# Patient Record
Sex: Male | Born: 1990 | Race: White | Hispanic: No | Marital: Single | Smoking: Former smoker
Health system: Southern US, Community
[De-identification: ages and names within clinical notes are randomized; demographics above are authoritative.]

## PROBLEM LIST (undated history)

## (undated) DIAGNOSIS — K219 Gastro-esophageal reflux disease without esophagitis: Secondary | ICD-10-CM

## (undated) DIAGNOSIS — T7840XA Allergy, unspecified, initial encounter: Secondary | ICD-10-CM

## (undated) DIAGNOSIS — I82409 Acute embolism and thrombosis of unspecified deep veins of unspecified lower extremity: Secondary | ICD-10-CM

## (undated) HISTORY — DX: Allergy, unspecified, initial encounter: T78.40XA

## (undated) HISTORY — PX: LEG SURGERY: SHX1003

## (undated) HISTORY — PX: KNEE SURGERY: SHX244

## (undated) HISTORY — PX: WISDOM TOOTH EXTRACTION: SHX21

---
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
## 2014-08-04 DIAGNOSIS — Z9109 Other allergy status, other than to drugs and biological substances: Secondary | ICD-10-CM | POA: Insufficient documentation

## 2015-08-03 DIAGNOSIS — J302 Other seasonal allergic rhinitis: Secondary | ICD-10-CM | POA: Insufficient documentation

## 2017-06-17 DIAGNOSIS — F1090 Alcohol use, unspecified, uncomplicated: Secondary | ICD-10-CM | POA: Insufficient documentation

## 2017-06-17 DIAGNOSIS — R1013 Epigastric pain: Secondary | ICD-10-CM | POA: Insufficient documentation

## 2019-10-12 ENCOUNTER — Other Ambulatory Visit: Payer: Self-pay

## 2019-10-12 DIAGNOSIS — Z20822 Contact with and (suspected) exposure to covid-19: Secondary | ICD-10-CM

## 2019-10-14 LAB — NOVEL CORONAVIRUS, NAA: SARS-CoV-2, NAA: NOT DETECTED

## 2019-10-16 ENCOUNTER — Other Ambulatory Visit: Payer: Self-pay

## 2019-10-17 ENCOUNTER — Telehealth: Payer: Self-pay

## 2019-10-17 NOTE — Telephone Encounter (Signed)
Per pt request, my chart message sent to pt to notify his manager regarding pending covid results  after for 5 days waiting for results to come back.Marland Kitchen

## 2019-10-19 ENCOUNTER — Telehealth: Payer: Self-pay

## 2019-10-19 NOTE — Telephone Encounter (Signed)
Received call from patient checking Covid results.  Advised no results at this time.   

## 2019-12-10 ENCOUNTER — Other Ambulatory Visit: Payer: Self-pay

## 2020-02-25 DIAGNOSIS — I82409 Acute embolism and thrombosis of unspecified deep veins of unspecified lower extremity: Principal | ICD-10-CM

## 2020-02-25 HISTORY — DX: Acute embolism and thrombosis of unspecified deep veins of unspecified lower extremity: I82.409

## 2020-03-16 ENCOUNTER — Emergency Department (HOSPITAL_COMMUNITY): Payer: 59

## 2020-03-16 ENCOUNTER — Inpatient Hospital Stay (HOSPITAL_COMMUNITY)
Admission: EM | Admit: 2020-03-16 | Discharge: 2020-03-19 | DRG: 872 | Disposition: A | Discharge status: Home / Self Care | Payer: 59 | Attending: Internal Medicine | Admitting: Internal Medicine

## 2020-03-16 ENCOUNTER — Other Ambulatory Visit: Payer: Self-pay

## 2020-03-16 DIAGNOSIS — K120 Recurrent oral aphthae: Secondary | ICD-10-CM | POA: Diagnosis present

## 2020-03-16 DIAGNOSIS — Z79899 Other long term (current) drug therapy: Secondary | ICD-10-CM

## 2020-03-16 DIAGNOSIS — I80202 Phlebitis and thrombophlebitis of unspecified deep vessels of left lower extremity: Secondary | ICD-10-CM

## 2020-03-16 DIAGNOSIS — I82409 Acute embolism and thrombosis of unspecified deep veins of unspecified lower extremity: Secondary | ICD-10-CM | POA: Diagnosis not present

## 2020-03-16 DIAGNOSIS — R319 Hematuria, unspecified: Secondary | ICD-10-CM | POA: Diagnosis present

## 2020-03-16 DIAGNOSIS — F17290 Nicotine dependence, other tobacco product, uncomplicated: Secondary | ICD-10-CM | POA: Diagnosis present

## 2020-03-16 DIAGNOSIS — D75839 Thrombocytosis, unspecified: Secondary | ICD-10-CM

## 2020-03-16 DIAGNOSIS — Z20822 Contact with and (suspected) exposure to covid-19: Secondary | ICD-10-CM | POA: Diagnosis present

## 2020-03-16 DIAGNOSIS — E669 Obesity, unspecified: Secondary | ICD-10-CM | POA: Diagnosis present

## 2020-03-16 DIAGNOSIS — A419 Sepsis, unspecified organism: Secondary | ICD-10-CM | POA: Diagnosis not present

## 2020-03-16 DIAGNOSIS — Z7901 Long term (current) use of anticoagulants: Secondary | ICD-10-CM

## 2020-03-16 DIAGNOSIS — R6510 Systemic inflammatory response syndrome (SIRS) of non-infectious origin without acute organ dysfunction: Secondary | ICD-10-CM

## 2020-03-16 DIAGNOSIS — Z6837 Body mass index (BMI) 37.0-37.9, adult: Secondary | ICD-10-CM

## 2020-03-16 DIAGNOSIS — K219 Gastro-esophageal reflux disease without esophagitis: Secondary | ICD-10-CM | POA: Diagnosis present

## 2020-03-16 DIAGNOSIS — L03019 Cellulitis of unspecified finger: Secondary | ICD-10-CM

## 2020-03-16 DIAGNOSIS — M79609 Pain in unspecified limb: Secondary | ICD-10-CM

## 2020-03-16 DIAGNOSIS — I824Y2 Acute embolism and thrombosis of unspecified deep veins of left proximal lower extremity: Secondary | ICD-10-CM

## 2020-03-16 DIAGNOSIS — D473 Essential (hemorrhagic) thrombocythemia: Secondary | ICD-10-CM

## 2020-03-16 DIAGNOSIS — L03011 Cellulitis of right finger: Secondary | ICD-10-CM | POA: Diagnosis present

## 2020-03-16 DIAGNOSIS — I82402 Acute embolism and thrombosis of unspecified deep veins of left lower extremity: Secondary | ICD-10-CM | POA: Diagnosis present

## 2020-03-16 DIAGNOSIS — K068 Other specified disorders of gingiva and edentulous alveolar ridge: Secondary | ICD-10-CM | POA: Diagnosis present

## 2020-03-16 LAB — CBC WITH DIFFERENTIAL/PLATELET
Abs Immature Granulocytes: 0.09 10*3/uL — ABNORMAL HIGH (ref 0.00–0.07)
Basophils Absolute: 0.1 10*3/uL (ref 0.0–0.1)
Basophils Relative: 0 %
Eosinophils Absolute: 0 10*3/uL (ref 0.0–0.5)
Eosinophils Relative: 0 %
HCT: 43.7 % (ref 39.0–52.0)
Hemoglobin: 14.4 g/dL (ref 13.0–17.0)
Immature Granulocytes: 1 %
Lymphocytes Relative: 14 %
Lymphs Abs: 2 10*3/uL (ref 0.7–4.0)
MCH: 28.7 pg (ref 26.0–34.0)
MCHC: 33 g/dL (ref 30.0–36.0)
MCV: 87.2 fL (ref 80.0–100.0)
Monocytes Absolute: 1.3 10*3/uL — ABNORMAL HIGH (ref 0.1–1.0)
Monocytes Relative: 9 %
Neutro Abs: 10.7 10*3/uL — ABNORMAL HIGH (ref 1.7–7.7)
Neutrophils Relative %: 76 %
Platelets: 570 10*3/uL — ABNORMAL HIGH (ref 150–400)
RBC: 5.01 MIL/uL (ref 4.22–5.81)
RDW: 12.3 % (ref 11.5–15.5)
WBC: 14.2 10*3/uL — ABNORMAL HIGH (ref 4.0–10.5)
nRBC: 0 % (ref 0.0–0.2)

## 2020-03-16 LAB — COMPREHENSIVE METABOLIC PANEL
ALT: 49 U/L — ABNORMAL HIGH (ref 0–44)
AST: 26 U/L (ref 15–41)
Albumin: 3.6 g/dL (ref 3.5–5.0)
Alkaline Phosphatase: 126 U/L (ref 38–126)
Anion gap: 11 (ref 5–15)
BUN: 8 mg/dL (ref 6–20)
CO2: 24 mmol/L (ref 22–32)
Calcium: 9.2 mg/dL (ref 8.9–10.3)
Chloride: 100 mmol/L (ref 98–111)
Creatinine, Ser: 1.04 mg/dL (ref 0.61–1.24)
GFR calc Af Amer: >60 mL/min (ref >60)
GFR calc non Af Amer: >60 mL/min (ref >60)
Glucose, Bld: 102 mg/dL — ABNORMAL HIGH (ref 70–99)
Potassium: 4.1 mmol/L (ref 3.5–5.1)
Sodium: 135 mmol/L (ref 135–145)
Total Bilirubin: 1.3 mg/dL — ABNORMAL HIGH (ref 0.3–1.2)
Total Protein: 8.3 g/dL — ABNORMAL HIGH (ref 6.5–8.1)

## 2020-03-16 LAB — URINALYSIS, ROUTINE W REFLEX MICROSCOPIC
Bacteria, UA: NONE SEEN
Glucose, UA: NEGATIVE mg/dL
Hgb urine dipstick: NEGATIVE
Ketones, ur: 5 mg/dL — AB
Leukocytes,Ua: NEGATIVE
Nitrite: NEGATIVE
Protein, ur: 30 mg/dL — AB
Specific Gravity, Urine: 1.024 (ref 1.005–1.030)
pH: 5 (ref 5.0–8.0)

## 2020-03-16 LAB — LACTIC ACID, PLASMA: Lactic Acid, Venous: 0.9 mmol/L (ref 0.5–1.9)

## 2020-03-16 LAB — PROTIME-INR
INR: 1.4 — ABNORMAL HIGH (ref 0.8–1.2)
Prothrombin Time: 16.9 seconds — ABNORMAL HIGH (ref 11.4–15.2)

## 2020-03-16 MED ORDER — SODIUM CHLORIDE 0.9% FLUSH: 3.0000 mL | Freq: Once | INTRAVENOUS | Status: DC

## 2020-03-16 NOTE — Progress Notes (Signed)
Left lower extremity venous duplex completed. Refer to "CV Proc" under chart review to view preliminary results. Preliminary results discussed with Dr. Maryan Rued.  03/16/2020 7:30 PM Kelby Aline., MHA, RVT, RDCS, RDMS

## 2020-03-16 NOTE — ED Triage Notes (Addendum)
Has been dx with blood clots in left leg on Monday-- "multiple clots in leg" now left leg swollen, painful, started as a rash on Easter-- was told it was shingles-- then dx as clots-- now gums are bleeding, tongue has white coating,  States "I have been getting sick a lot lately-- have not had covid--" has had negative tests.  On xeralto -- last dose this am  Also developed an infection around right index fingernail.

## 2020-03-17 ENCOUNTER — Encounter (HOSPITAL_COMMUNITY): Payer: Self-pay | Admitting: Internal Medicine

## 2020-03-17 ENCOUNTER — Inpatient Hospital Stay (HOSPITAL_COMMUNITY): Payer: 59

## 2020-03-17 DIAGNOSIS — I824Z2 Acute embolism and thrombosis of unspecified deep veins of left distal lower extremity: Secondary | ICD-10-CM | POA: Diagnosis not present

## 2020-03-17 DIAGNOSIS — I80202 Phlebitis and thrombophlebitis of unspecified deep vessels of left lower extremity: Secondary | ICD-10-CM

## 2020-03-17 DIAGNOSIS — D473 Essential (hemorrhagic) thrombocythemia: Secondary | ICD-10-CM

## 2020-03-17 DIAGNOSIS — I82492 Acute embolism and thrombosis of other specified deep vein of left lower extremity: Secondary | ICD-10-CM

## 2020-03-17 DIAGNOSIS — R6510 Systemic inflammatory response syndrome (SIRS) of non-infectious origin without acute organ dysfunction: Secondary | ICD-10-CM

## 2020-03-17 DIAGNOSIS — I82409 Acute embolism and thrombosis of unspecified deep veins of unspecified lower extremity: Secondary | ICD-10-CM | POA: Diagnosis present

## 2020-03-17 DIAGNOSIS — Z6837 Body mass index (BMI) 37.0-37.9, adult: Secondary | ICD-10-CM | POA: Diagnosis not present

## 2020-03-17 DIAGNOSIS — R319 Hematuria, unspecified: Secondary | ICD-10-CM | POA: Diagnosis present

## 2020-03-17 DIAGNOSIS — E669 Obesity, unspecified: Secondary | ICD-10-CM | POA: Diagnosis present

## 2020-03-17 DIAGNOSIS — I82402 Acute embolism and thrombosis of unspecified deep veins of left lower extremity: Secondary | ICD-10-CM | POA: Diagnosis not present

## 2020-03-17 DIAGNOSIS — A419 Sepsis, unspecified organism: Secondary | ICD-10-CM | POA: Diagnosis present

## 2020-03-17 DIAGNOSIS — Z20822 Contact with and (suspected) exposure to covid-19: Secondary | ICD-10-CM | POA: Diagnosis present

## 2020-03-17 DIAGNOSIS — K068 Other specified disorders of gingiva and edentulous alveolar ridge: Secondary | ICD-10-CM | POA: Diagnosis present

## 2020-03-17 DIAGNOSIS — K120 Recurrent oral aphthae: Secondary | ICD-10-CM | POA: Diagnosis present

## 2020-03-17 DIAGNOSIS — L03019 Cellulitis of unspecified finger: Secondary | ICD-10-CM

## 2020-03-17 DIAGNOSIS — Z7901 Long term (current) use of anticoagulants: Secondary | ICD-10-CM | POA: Diagnosis not present

## 2020-03-17 DIAGNOSIS — D75839 Thrombocytosis, unspecified: Secondary | ICD-10-CM

## 2020-03-17 DIAGNOSIS — L03011 Cellulitis of right finger: Secondary | ICD-10-CM | POA: Diagnosis present

## 2020-03-17 DIAGNOSIS — F17290 Nicotine dependence, other tobacco product, uncomplicated: Secondary | ICD-10-CM | POA: Diagnosis present

## 2020-03-17 DIAGNOSIS — Z79899 Other long term (current) drug therapy: Secondary | ICD-10-CM | POA: Diagnosis not present

## 2020-03-17 DIAGNOSIS — K219 Gastro-esophageal reflux disease without esophagitis: Secondary | ICD-10-CM | POA: Diagnosis present

## 2020-03-17 LAB — APTT
aPTT: 36 seconds (ref 24–36)
aPTT: 59 seconds — ABNORMAL HIGH (ref 24–36)

## 2020-03-17 LAB — RESPIRATORY PANEL BY RT PCR (FLU A&B, COVID)
Influenza A by PCR: NEGATIVE
Influenza B by PCR: NEGATIVE
SARS Coronavirus 2 by RT PCR: NEGATIVE

## 2020-03-17 LAB — GROUP A STREP BY PCR: Group A Strep by PCR: NOT DETECTED

## 2020-03-17 LAB — LACTIC ACID, PLASMA: Lactic Acid, Venous: 1.7 mmol/L (ref 0.5–1.9)

## 2020-03-17 LAB — PROTIME-INR
INR: 1.4 — ABNORMAL HIGH (ref 0.8–1.2)
Prothrombin Time: 17.1 seconds — ABNORMAL HIGH (ref 11.4–15.2)

## 2020-03-17 LAB — ANTITHROMBIN III: AntiThromb III Func: 84 % (ref 75–120)

## 2020-03-17 LAB — HEPARIN LEVEL (UNFRACTIONATED): Heparin Unfractionated: 1.02 IU/mL — ABNORMAL HIGH (ref 0.30–0.70)

## 2020-03-17 LAB — HIV ANTIBODY (ROUTINE TESTING W REFLEX): HIV Screen 4th Generation wRfx: NONREACTIVE

## 2020-03-17 MED ORDER — MORPHINE SULFATE (PF) 2 MG/ML IV SOLN
1.0000 mg | INTRAVENOUS | Status: DC | PRN
  Administered 2020-03-17 – 2020-03-18 (×4): 1 mg via INTRAVENOUS
  Filled 2020-03-17 (×4): qty 1

## 2020-03-17 MED ORDER — ACETAMINOPHEN 325 MG PO TABS
650.0000 mg | ORAL_TABLET | Freq: Four times a day (QID) | ORAL | Status: DC | PRN
  Administered 2020-03-18: 650 mg via ORAL
  Filled 2020-03-17: qty 2

## 2020-03-17 MED ORDER — SODIUM CHLORIDE 0.9 % IV SOLN
1.0000 g | Freq: Once | INTRAVENOUS | Status: AC
  Administered 2020-03-17: 1 g via INTRAVENOUS
  Filled 2020-03-17: qty 10

## 2020-03-17 MED ORDER — IOHEXOL 350 MG/ML SOLN
75.0000 mL | Freq: Once | INTRAVENOUS | Status: AC | PRN
  Administered 2020-03-17: 75 mL via INTRAVENOUS

## 2020-03-17 MED ORDER — ACETAMINOPHEN 650 MG RE SUPP
650.0000 mg | Freq: Four times a day (QID) | RECTAL | Status: DC | PRN
  Filled 2020-03-17: qty 1

## 2020-03-17 MED ORDER — HEPARIN (PORCINE) 25000 UT/250ML-% IV SOLN
2450.0000 [IU]/h | INTRAVENOUS | Status: DC
  Administered 2020-03-17: 1850 [IU]/h via INTRAVENOUS
  Administered 2020-03-17: 2050 [IU]/h via INTRAVENOUS
  Administered 2020-03-18: 2300 [IU]/h via INTRAVENOUS
  Filled 2020-03-17 (×3): qty 250

## 2020-03-17 MED ORDER — SODIUM CHLORIDE 0.9 % IV SOLN
2.0000 g | Freq: Three times a day (TID) | INTRAVENOUS | Status: DC
  Administered 2020-03-17 – 2020-03-18 (×4): 2 g via INTRAVENOUS
  Filled 2020-03-17 (×6): qty 2

## 2020-03-17 MED ORDER — VANCOMYCIN HCL 2000 MG/400ML IV SOLN
2000.0000 mg | Freq: Once | INTRAVENOUS | Status: AC
  Administered 2020-03-17: 2000 mg via INTRAVENOUS
  Filled 2020-03-17: qty 400

## 2020-03-17 MED ORDER — VANCOMYCIN HCL 1250 MG/250ML IV SOLN
1250.0000 mg | Freq: Three times a day (TID) | INTRAVENOUS | Status: DC
  Administered 2020-03-17 – 2020-03-18 (×2): 1250 mg via INTRAVENOUS
  Filled 2020-03-17 (×3): qty 250

## 2020-03-17 MED ORDER — LIDOCAINE HCL (PF) 1 % IJ SOLN
5.0000 mL | Freq: Once | INTRAMUSCULAR | Status: AC
  Administered 2020-03-17: 5 mL via INTRADERMAL
  Filled 2020-03-17: qty 5

## 2020-03-17 MED ORDER — FENTANYL CITRATE (PF) 100 MCG/2ML IJ SOLN
50.0000 ug | Freq: Once | INTRAMUSCULAR | Status: AC
  Administered 2020-03-17: 50 ug via INTRAVENOUS
  Filled 2020-03-17: qty 2

## 2020-03-17 NOTE — Progress Notes (Signed)
ANTICOAGULATION CONSULT NOTE - Initial Consult  Pharmacy Consult for Heparin Indication: DVT  Not on File  Patient Measurements: Height: 6\' 4"  (193 cm) Weight: (!) 138.3 kg (305 lb) IBW/kg (Calculated) : 86.8 Heparin Dosing Weight: 117.5 kg  Vital Signs: Temp: 98.9 F (37.2 C) (04/22 1554) Temp Source: Oral (04/22 1554) BP: 127/76 (04/22 1554) Pulse Rate: 80 (04/22 1554)  Labs: Recent Labs    03/16/20 1841 03/17/20 0413 03/17/20 1604  HGB 14.4  --   --   HCT 43.7  --   --   PLT 570*  --   --   APTT  --  36 59*  LABPROT 16.9* 17.1*  --   INR 1.4* 1.4*  --   HEPARINUNFRC  --  1.02*  --   CREATININE 1.04  --   --     Estimated Creatinine Clearance: 162.1 mL/min (by C-G formula based on SCr of 1.04 mg/dL).   Medical History: Past Medical History:  Diagnosis Date  . DVT (deep venous thrombosis) (HCC)     Medications:  Lyrica  Valtrex  Xarelto  Assessment: 29 y.o. male with recent DVT in LLE per dopplers, Xarelto started 4/19, for heparin.  Last dose of Xarelto AM 4/21. Pharmacy asked to dose heparin.  Initial aPTT subtherapeutic on drip rate 1850 units/hr. Will dose based off aPTT until levels correlate with HL. CBC this am was wnl, No overt bleeding or infusion issues noted.  Goal of Therapy:  APTT 66-102 sec Heparin level 0.3-0.7 units/ml Monitor platelets by anticoagulation protocol: Yes   Plan:  Increase heparin infusion to 2050 units/hr Check 6-hr aPTT Monitor daily aPTT, HL, cbc, s/sx bleeding  Richardine Service, PharmD PGY1 Pharmacy Resident Phone: [phone] 03/17/2020  4:37 PM  Please check [email or web address] for unit-specific pharmacy phone numbers.

## 2020-03-17 NOTE — Progress Notes (Signed)
Pharmacy Antibiotic Note  Noah Moore is a 29 y.o. male admitted on 03/16/2020 with DVT and possible sepsis.  Pharmacy has been consulted for Vancomycin and Cefepime  dosing.  Plan: Vancomycin 2000 mg IV now, then 1250 mg IV q8h Est AUC 480 Cefepime 2 g IV q8h  Height: 6\' 4"  (193 cm) Weight: (!) 138.3 kg (305 lb) IBW/kg (Calculated) : 86.8  Temp (24hrs), Avg:99.5 F (37.5 C), Min:98.6 F (37 C), Max:101.1 F (38.4 C)  Recent Labs  Lab 03/16/20 1841  WBC 14.2*  CREATININE 1.04  LATICACIDVEN 0.9    Estimated Creatinine Clearance: 162.1 mL/min (by C-G formula based on SCr of 1.04 mg/dL).    Not on File   Caryl Pina 03/17/2020 7:04 AM

## 2020-03-17 NOTE — ED Provider Notes (Signed)
[facility]PART[facility] Provider Note  CSN: [id number] Arrival date & time: 03/16/20 1809  Chief Complaint(s) blood clot in leg and bleeding gums  HPI Page Noah Moore is a 29 y.o. male with no pertinent past medical history other than recently being diagnosed with left lower extremity DVT currently on Xarelto presents to the emergency department with worsening left leg pain.  Patient reports leg pain began several weeks ago with skin changes.  Initially told he had shingles but then diagnosed with a DVT this past Monday.  Xarelto treatment began at the same time.  No improvement in pain with worsening pain, swelling and redness.  Pain worse with palpation and ambulation.  No alleviating factors.  In addition patient reports white tongue and small amount of gingival bleeding since starting on Xarelto.  He denies any known fevers but does endorse chills and diaphoresis.  No coughing or congestion.  No chest pain or shortness of breath.  No abdominal pain.  No urinary symptoms.  No diarrhea.  Patient does have paronychia in the right index finger.  Patient reports that several months ago he began to get sick with flulike symptoms.  Tested for Covid numerous times and negative.  Ultimately diagnosed with positive influenza test.  Since then he has been having recurrent infections.  Patient denies any illicit drug use including IV drugs.  Denies any knowledge of having HIV or other chronic infections.  HPI  Past Medical History No past medical history on file. There are no problems to display for this patient.  Home Medication(s) Prior to Admission medications   Medication Sig Start Date End Date Taking? Authorizing Provider  cetirizine (ZYRTEC) 10 MG tablet Take 1 tablet by mouth daily.   Yes [provider]  traMADol (ULTRAM) 50 MG tablet Take 50 mg by mouth every 6 (six) hours as needed for moderate pain. 03/14/20  Yes [provider]  Dollene Primrose PACK Take 1-2 tablets by mouth See admin instructions. Take as directed. 03/14/20  Yes [provider]                                                                                                                                    Past Surgical History ** The histories are not reviewed yet. Please review them in the "History" navigator section and refresh this [place]. Family History No family history on file.  Social History Social History   Tobacco Use  . Smoking status: Not on file  Substance Use Topics  . Alcohol use: Not on file  . Drug use: Not on file   Allergies Patient has no allergy information on record.  Review of Systems Review of Systems All other systems are reviewed and are negative for acute change except as noted in the HPI  Physical Exam Vital Signs  I have reviewed the triage vital signs BP (!) 103/59   Pulse 90  Temp 101.1 F (37.2 C) (Oral)   Resp 18   Ht 6\' 4"  (1.93 m)   Wt (!) 138.3 kg   SpO2 97%   BMI 37.13 kg/m   Physical Exam Vitals reviewed.  Constitutional:      General: He is not in acute distress.    Appearance: He is well-developed. He is not diaphoretic.  HENT:     Head: Normocephalic and atraumatic.     Nose: Nose normal.     Mouth/Throat:     Mouth: Oral lesions present.     Tongue: Lesions (ulcers) present.     Tonsils: No tonsillar exudate or tonsillar abscesses.     Comments: White tongue Eyes:     General: No scleral icterus.       Right eye: No discharge.        Left eye: No discharge.     Conjunctiva/sclera: Conjunctivae normal.     Pupils: Pupils are equal, round, and reactive to light.  Cardiovascular:     Rate and Rhythm: Normal rate and regular rhythm.     Heart sounds: No murmur. No friction rub. No gallop.   Pulmonary:     Effort: Pulmonary effort is normal. No respiratory distress.     Breath sounds: Normal breath sounds. No stridor. No rales.  Abdominal:     General: There is no  distension.     Palpations: Abdomen is soft.     Tenderness: There is no abdominal tenderness.  Musculoskeletal:        General: No tenderness.       Hands:     Cervical back: Normal range of motion and neck supple.     Left lower leg: Swelling present.       Legs:  Skin:    General: Skin is warm and dry.     Findings: No erythema or rash.  Neurological:     Mental Status: He is alert and oriented to person, place, and time.     ED Results and Treatments Labs (all labs ordered are listed, but only abnormal results are displayed) Labs Reviewed  COMPREHENSIVE METABOLIC PANEL - Abnormal; Notable for the following components:      Result Value   Glucose, Bld 102 (*)    Total Protein 8.3 (*)    ALT 49 (*)    Total Bilirubin 1.3 (*)    All other components within normal limits  CBC WITH DIFFERENTIAL/PLATELET - Abnormal; Notable for the following components:   WBC 14.2 (*)    Platelets 570 (*)    Neutro Abs 10.7 (*)    Monocytes Absolute 1.3 (*)    Abs Immature Granulocytes 0.09 (*)    All other components within normal limits  URINALYSIS, ROUTINE W REFLEX MICROSCOPIC - Abnormal; Notable for the following components:   Color, Urine AMBER (*)    Bilirubin Urine SMALL (*)    Ketones, ur 5 (*)    Protein, ur 30 (*)    All other components within normal limits  PROTIME-INR - Abnormal; Notable for the following components:   Prothrombin Time 16.9 (*)    INR 1.4 (*)    All other components within normal limits  RESPIRATORY PANEL BY RT PCR (FLU A&B, COVID)  GROUP A STREP BY PCR  LACTIC ACID, PLASMA  LACTIC ACID, PLASMA  HEPARIN LEVEL (UNFRACTIONATED)  APTT  PROTIME-INR  EKG  EKG Interpretation  Date/Time:    Ventricular Rate:    PR Interval:    QRS Duration:   QT Interval:    QTC Calculation:   R Axis:     Text Interpretation:         Radiology DG Chest 2 View  Result Date: 03/16/2020 CLINICAL DATA:  29 year old male with left lower extremity pain. Recent blood clot. EXAM: CHEST - 2 VIEW COMPARISON:  None FINDINGS: The heart size and mediastinal contours are within normal limits. Both lungs are clear. The visualized skeletal structures are unremarkable. IMPRESSION: No active cardiopulmonary disease. Electronically Signed   By: Anner Crete M.D.   On: 03/16/2020 19:11   VAS [place] LOWER EXTREMITY VENOUS (DVT) (ONLY [facility] & [facility])  Result Date: 03/16/2020  Lower Venous DVTStudy Indications: Pain, and recent RLE DVT diagnosis- unable to obtain films or report.  Limitations: Body habitus and poor ultrasound/tissue interface. Comparison Study: No prior study Performing Technologist: Maudry Mayhew MHA, RDMS, RVT, RDCS  Examination Guidelines: A complete evaluation includes B-mode imaging, spectral Doppler, color Doppler, and power Doppler as needed of all accessible portions of each vessel. Bilateral testing is considered an integral part of a complete examination. Limited examinations for reoccurring indications may be performed as noted. The reflux portion of the exam is performed with the patient in reverse Trendelenburg.  +-----+---------------+---------+-----------+----------+--------------+ RIGHTCompressibilityPhasicitySpontaneityPropertiesThrombus Aging +-----+---------------+---------+-----------+----------+--------------+ CFV  Full           Yes      Yes                                 +-----+---------------+---------+-----------+----------+--------------+   +---------+---------------+---------+-----------+----------+--------------+ LEFT     CompressibilityPhasicitySpontaneityPropertiesThrombus Aging +---------+---------------+---------+-----------+----------+--------------+ CFV      None                    No                   Acute           +---------+---------------+---------+-----------+----------+--------------+ SFJ      Full                                                        +---------+---------------+---------+-----------+----------+--------------+ FV Prox  None                                         Acute          +---------+---------------+---------+-----------+----------+--------------+ FV Mid   None                                         Acute          +---------+---------------+---------+-----------+----------+--------------+ FV DistalNone                                         Acute          +---------+---------------+---------+-----------+----------+--------------+ POP      None  No                   Acute          +---------+---------------+---------+-----------+----------+--------------+ PTV      Full                    Yes                                 +---------+---------------+---------+-----------+----------+--------------+ EIV                              Yes        Patent                   +---------+---------------+---------+-----------+----------+--------------+   Left Technical Findings: Not visualized segments include PFV, peroneal veins, left common iliac vein, IVC, limited visualization left EIV.   Summary: RIGHT: - No evidence of common femoral vein obstruction.  LEFT: - Findings consistent with acute deep vein thrombosis involving the left common femoral vein, left femoral vein, and left popliteal vein. - No cystic structure found in the popliteal fossa.  *See table(s) above for measurements and observations.    Preliminary     Pertinent labs & imaging results that were available during my care of the patient were reviewed by me and considered in my medical decision making (see chart for details).  Medications Ordered in ED Medications  sodium chloride flush (NS) 0.9 % injection 3 mL (has no administration in time range)  lidocaine (PF)  (XYLOCAINE) 1 % injection 5 mL (has no administration in time range)  cefTRIAXone (ROCEPHIN) 1 g in sodium chloride 0.9 % 100 mL IVPB (0 g Intravenous Stopped 03/17/20 0434)                                                                                                                                    Procedures Drain paronychia  Date/Time: 03/17/2020 7:12 AM Performed by: Fatima Blank, MD Authorized by: Fatima Blank, MD  Consent: Verbal consent obtained. Risks and benefits: risks, benefits and alternatives were discussed Consent given by: patient Patient understanding: patient states understanding of the procedure being performed Time out: Immediately prior to procedure a "time out" was called to verify the correct patient, procedure, equipment, support staff and site/side marked as required. Preparation: Patient was prepped and draped in the usual sterile fashion. Local anesthesia used: yes Anesthesia: digital block  Anesthesia: Local anesthesia used: yes Local Anesthetic: lidocaine 1% without epinephrine Patient tolerance: patient tolerated the procedure well with no immediate complications  .Critical Care Performed by: Fatima Blank, MD Authorized by: Fatima Blank, MD    CRITICAL CARE Performed by: Grayce Sessions Feliberto Stockley Total critical care time: 35 minutes Critical care time was exclusive of separately billable procedures  and treating other patients. Critical care was necessary to treat or prevent imminent or life-threatening deterioration. Critical care was time spent personally by me on the following activities: development of treatment plan with patient and/or surrogate as well as nursing, discussions with consultants, evaluation of patient's response to treatment, examination of patient, obtaining history from patient or surrogate, ordering and performing treatments and interventions, ordering and review of laboratory studies, ordering  and review of radiographic studies, pulse oximetry and re-evaluation of patient's condition.    (including critical care time)  Medical Decision Making / ED Course I have reviewed the nursing notes for this encounter and the patient's prior records (if available in EHR or on provided paperwork).   Wayde Nybo was evaluated in Emergency Department on 03/17/2020 for the symptoms described in the history of present illness. He was evaluated in the context of the global COVID-19 pandemic, which necessitated consideration that the patient might be at risk for infection with the SARS-CoV-2 virus that causes COVID-19. Institutional protocols and algorithms that pertain to the evaluation of patients at risk for COVID-19 are in a state of rapid change based on information released by regulatory bodies including the CDC and federal and state organizations. These policies and algorithms were followed during the patient's care in the ED.  Patient presents with known DVT in the left lower extremity with worsening swelling and pain.  On exam patient has discoloration to the left lower extremity concerning for phlegmasia.  DVT ultrasound obtained in triage confirmed a DVT in the common femoral, femoral and popliteal veins.   On arrival patient was noted to be febrile. Patient has a paronychia in the right index finger. Patient has oral lesions.  No other possible sources of infection at this time. CBC with leukocytosis and thrombocytosis.  Left shift.  Chest x-ray without evidence of pneumonia.  Urine without evidence of infection. We will obtain strep PCR as well as Covid and influenza PCR. Patient started on empiric Rocephin but is not septic at this time. No gingival bleeding at this time We will start patient on IV heparin.  Will require admission for continued work-up and management.       Final Clinical Impression(s) / ED Diagnoses Final diagnoses:  Acute deep vein thrombosis (DVT) of  proximal end of left lower extremity ([place])      This chart was dictated using voice recognition software.  Despite best efforts to proofread,  errors can occur which can change the documentation meaning.   Fatima Blank, MD 03/17/20 [phone]

## 2020-03-17 NOTE — ED Notes (Signed)
GOT PATIENT A URINAL PATIENT IS RESTING WITH FAMILY AT BEDSIDE AND CALL BELL IN REach

## 2020-03-17 NOTE — ED Notes (Signed)
Lunch Tray Ordered @ 1043. 

## 2020-03-17 NOTE — Progress Notes (Signed)
PROGRESS NOTE    Noah Moore  [id number] DOB: [birth year] DOA: 03/16/2020 PCP: Patient, No Pcp Per    Brief Narrative:  29 year old gentleman with no medical issues, recently diagnosed left lower lobe extremity DVT and is started on Xarelto presents to the emergency room with worsening left leg pain and bleeding from his gums on brushing the teeth.  Patient does not have any medical history, however he does drive long hours.  Easter weekend, he drove for 8 hours following which he started noticing some redness on his medial aspect of the left leg and he started hurting.  Was seen by provider, thought to be shingles and treated with antiviral.  His leg becomes more painful, went to see his primary care physician in [facility] on 4/19 where he was diagnosed with left lower leg DVT and discharged home with Xarelto.  After he started taking Xarelto, he continues to have swelling and discoloration, unable to walk.  He also complained of gums bleeding when he brushes his teeth.  So he came to the ER. In the emergency room, temperature 101.  Slightly tachycardic.  Not hypoxic.  On room air.  WC count 14,000.  Hemoglobin normal.  COVID-19 and influenza panel negative.  Streptococcal antigen Negative.  Patient is started on IV heparin and IV antibiotics and admitted to the hospital due to intolerance of oral anticoagulation.   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  Due to severity of symptoms, agreeable to stay in the hospital for monitoring.  Started on heparin.  Will continue heparin until he can tolerate safely.  Then ultimately will convert to oral  anticoagulation. Patient does have multiple DVTs left lower extremity, however there is no evidence of ischemia.  Has good peripheral pulses with no evidence of arterial occlusion. Called and discussed with vascular surgery to evaluate if he benefits with any thrombolysis/thrombectomy. Elevate left leg.  Will teach compression stockings after he tolerates anticoagulation. CT scan of the chest with no evidence of pulmonary embolism. His DVT is probably precipitated due to prolonged travel.  No other obvious cause. Multiple testing for genetic causes has been sent, will follow up results. Will recommend 3 to 6 months of anticoagulation and follow-up.  SIRS syndrome: Probably related to #1.  No other evidence of localized bacterial infection other than a small paronychia in his finger.  Reasonable to continue antibiotics for 24 hours pending clinical improvement.   DVT prophylaxis: Heparin infusion Code Status: Full code Family Communication: Fianc at the bedside Disposition Plan: Status is: Inpatient  Remains inpatient appropriate because:Inpatient level of care appropriate due to severity of illness   Dispo: The patient is from: Home              Anticipated d/c is to: Home              Anticipated d/c date is: 2 days              Patient currently is not medically stable to d/c.          Consultants:   Vascular surgery  Procedures:   None  Antimicrobials:  Antibiotics Given (last 72 hours)  Date/Time Action Medication Dose Rate   03/17/20 0353 New Bag/Given   cefTRIAXone (ROCEPHIN) 1 g in sodium chloride 0.9 % 100 mL IVPB 1 g 200 mL/hr   03/17/20 1019 New Bag/Given   ceFEPIme (MAXIPIME) 2 g in sodium chloride 0.9 % 100 mL IVPB 2 g 200 mL/hr   03/17/20 1112 New Bag/Given   vancomycin (VANCOREADY) IVPB 2000 mg/400 mL 2,000 mg 200 mL/hr         Subjective: Patient was seen and examined.  Admitted early morning hours by nighttime hospitalist.  History and  findings reviewed with patient and his fiance at the bedside.  Questions answered. Patient was very worried about pain on his left leg and his future outcomes.  Objective: Vitals:   03/17/20 1200 03/17/20 1246 03/17/20 1300 03/17/20 1330  BP: (!) 100/59 106/77 109/76 114/79  Pulse: 86 91 91 82  Resp: 17 18 18 18   Temp:      TempSrc:      SpO2: 98% 100% 99% 99%  Weight:      Height:        Intake/Output Summary (Last 24 hours) at 03/17/2020 1410 Last data filed at 03/17/2020 1312 Gross per 24 hour  Intake 400 ml  Output --  Net 400 ml   Filed Weights   03/16/20 1813  Weight: (!) 138.3 kg    Examination:  General exam: Appears calm and comfortable, not in any distress. Respiratory system: Clear to auscultation. Respiratory effort normal.  No added sounds. Cardiovascular system: S1 & S2 heard, RRR. No JVD, murmurs, rubs, gallops or clicks.  Gastrointestinal system: Abdomen is nondistended, soft and nontender. No organomegaly or masses felt. Normal bowel sounds heard. Central nervous system: Alert and oriented. No focal neurological deficits. Extremities: Symmetric 5 x 5 power. Psychiatry: Judgement and insight appear normal. Mood & affect appropriate.  Left leg: Minimal tenderness along the calf muscles.  He has some macular rashes along the lower leg.  Distal neurovascular status intact.  Distal pulses are palpable.   Data Reviewed: I have personally reviewed following labs and imaging studies  CBC: Recent Labs  Lab 03/16/20 1841  WBC 14.2*  NEUTROABS 10.7*  HGB 14.4  HCT 43.7  MCV 87.2  PLT [id number]*   Basic Metabolic Panel: Recent Labs  Lab 03/16/20 1841  NA 135  K 4.1  CL 100  CO2 24  GLUCOSE 102*  BUN 8  CREATININE 1.04  CALCIUM 9.2   GFR: Estimated Creatinine Clearance: 162.1 mL/min (by C-G formula based on SCr of 1.04 mg/dL). Liver Function Tests: Recent Labs  Lab 03/16/20 1841  AST 26  ALT 49*  ALKPHOS 126  BILITOT 1.3*  PROT 8.3*  ALBUMIN  3.6   No results for input(s): LIPASE, AMYLASE in the last 168 hours. No results for input(s): AMMONIA in the last 168 hours. Coagulation Profile: Recent Labs  Lab 03/16/20 1841 03/17/20 0413  INR 1.4* 1.4*   Cardiac Enzymes: No results for input(s): CKTOTAL, CKMB, CKMBINDEX, TROPONINI in the last 168 hours. BNP (last 3 results) No results for input(s): PROBNP in the last 8760 hours. HbA1C: No results for input(s): HGBA1C in the last 72 hours. CBG: No results for input(s): GLUCAP in the last 168 hours. Lipid Profile: No results for input(s): CHOL, HDL, LDLCALC, TRIG, CHOLHDL, LDLDIRECT in the last 72 hours. Thyroid Function Tests: No results for input(s): TSH, T4TOTAL, FREET4, T3FREE, THYROIDAB in the last 72 hours. Anemia Panel: No results for input(s): VITAMINB12, FOLATE, FERRITIN, TIBC, IRON, RETICCTPCT  in the last 72 hours. Sepsis Labs: Recent Labs  Lab 03/16/20 1841 03/17/20 0746  LATICACIDVEN 0.9 1.7    Recent Results (from the past 240 hour(s))  Respiratory Panel by RT PCR (Flu A&B, Covid) - Throat     Status: None   Collection Time: 03/17/20  4:13 AM   Specimen: Throat  Result Value Ref Range Status   SARS Coronavirus 2 by RT PCR NEGATIVE NEGATIVE Final    Comment: (NOTE) SARS-CoV-2 target nucleic acids are NOT DETECTED. The SARS-CoV-2 RNA is generally detectable in upper respiratoy specimens during the acute phase of infection. The lowest concentration of SARS-CoV-2 viral copies this assay can detect is 131 copies/mL. A negative result does not preclude SARS-Cov-2 infection and should not be used as the sole basis for treatment or other patient management decisions. A negative result may occur with  improper specimen collection/handling, submission of specimen other than nasopharyngeal swab, presence of viral mutation(s) within the areas targeted by this assay, and inadequate number of viral copies (<131 copies/mL). A negative result must be combined with  clinical observations, patient history, and epidemiological information. The expected result is Negative. Fact Sheet for Patients:  PinkCheek.be Fact Sheet for Healthcare Providers:  GravelBags.it This test is not yet ap proved or cleared by the [place] FDA and  has been authorized for detection and/or diagnosis of SARS-CoV-2 by FDA under an Emergency Use Authorization (EUA). This EUA will remain  in effect (meaning this test can be used) for the duration of the COVID-19 declaration under Section 564(b)(1) of the Act, 21 U.S.C. section 360bbb-3(b)(1), unless the authorization is terminated or revoked sooner.    Influenza A by PCR NEGATIVE NEGATIVE Final   Influenza B by PCR NEGATIVE NEGATIVE Final    Comment: (NOTE) The Xpert Xpress SARS-CoV-2/FLU/RSV assay is intended as an aid in  the diagnosis of influenza from Nasopharyngeal swab specimens and  should not be used as a sole basis for treatment. Nasal washings and  aspirates are unacceptable for Xpert Xpress SARS-CoV-2/FLU/RSV  testing. Fact Sheet for Patients: PinkCheek.be Fact Sheet for Healthcare Providers: GravelBags.it This test is not yet approved or cleared by the [place] FDA and  has been authorized for detection and/or diagnosis of SARS-CoV-2 by  FDA under an Emergency Use Authorization (EUA). This EUA will remain  in effect (meaning this test can be used) for the duration of the  Covid-19 declaration under Section 564(b)(1) of the Act, 21  U.S.C. section 360bbb-3(b)(1), unless the authorization is  terminated or revoked. Performed at [facility], [place] [address] [place], [place] [address]   Group A Strep by PCR     Status: None   Collection Time: 03/17/20  4:40 AM  Result Value Ref Range Status   Group A Strep by PCR NOT DETECTED NOT DETECTED Final    Comment: Performed at [facility], [place] [address] [place], [place] [address]  Blood culture (routine x 2)     Status: None (Preliminary result)   Collection Time: 03/17/20  5:49 AM   Specimen: BLOOD LEFT ARM  Result Value Ref Range Status   Specimen Description BLOOD LEFT ARM  Final   Special Requests   Final    BOTTLES DRAWN AEROBIC AND ANAEROBIC Blood Culture results may not be optimal due to an excessive volume of blood received in culture bottles Performed at [place] [address] [place],  [address]    Culture PENDING  Incomplete  Report Status PENDING  Incomplete  Blood culture (routine x 2)     Status: None (Preliminary result)   Collection Time: 03/17/20  5:49 AM   Specimen: BLOOD RIGHT ARM  Result Value Ref Range Status   Specimen Description BLOOD RIGHT ARM  Final   Special Requests   Final    BOTTLES DRAWN AEROBIC AND ANAEROBIC Blood Culture results may not be optimal due to an excessive volume of blood received in culture bottles Performed at [facility], [place] [address] [place], [place] [address]    Culture PENDING  Incomplete   Report Status PENDING  Incomplete         Radiology Studies: DG Chest 2 View  Result Date: 03/16/2020 CLINICAL DATA:  29 year old male with left lower extremity pain. Recent blood clot. EXAM: CHEST - 2 VIEW COMPARISON:  None FINDINGS: The heart size and mediastinal contours are within normal limits. Both lungs are clear. The visualized skeletal structures are unremarkable. IMPRESSION: No active cardiopulmonary disease. Electronically Signed   By: Anner Crete M.D.   On: 03/16/2020 19:11   CT ANGIO CHEST PE W OR WO CONTRAST  Result Date: 03/17/2020 CLINICAL DATA:  Left leg pain and swelling with history of DVT. EXAM: CT ANGIOGRAPHY CHEST WITH CONTRAST TECHNIQUE: Multidetector CT imaging of the chest was performed using the standard protocol during bolus administration of intravenous contrast. Multiplanar CT image reconstructions and  MIPs were obtained to evaluate the vascular anatomy. CONTRAST:  50mL OMNIPAQUE IOHEXOL 350 MG/ML SOLN COMPARISON:  None. FINDINGS: Cardiovascular: The heart is normal in size. No pericardial effusion. The aorta is normal in caliber. No dissection. No atherosclerotic calcifications. The branch vessels are patent. The left vertebral artery comes directly off the aorta. No coronary artery calcifications. The pulmonary arterial tree is fairly well opacified. No definite filling defects to suggest pulmonary embolism. Mediastinum/Nodes: No mediastinal or hilar mass or adenopathy. Small scattered lymph nodes are noted. The esophagus is grossly normal. The thyroid gland is unremarkable. Lungs/Pleura: The lungs are clear. No pleural effusion or pulmonary lesions. Upper Abdomen: No significant upper abdominal findings. Musculoskeletal: No chest wall mass, supraclavicular or axillary adenopathy. The bony thorax is intact. Review of the MIP images confirms the above findings. IMPRESSION: 1. No CT findings for pulmonary embolism. 2. Normal thoracic aorta. 3. No acute pulmonary findings or worrisome pulmonary lesions. Electronically Signed   By: Marijo Sanes M.D.   On: 03/17/2020 07:35   VAS [place] LOWER EXTREMITY VENOUS (DVT) (ONLY [facility] & [facility])  Result Date: 03/16/2020  Lower Venous DVTStudy Indications: Pain, and recent RLE DVT diagnosis- unable to obtain films or report.  Limitations: Body habitus and poor ultrasound/tissue interface. Comparison Study: No prior study Performing Technologist: Maudry Mayhew MHA, RDMS, RVT, RDCS  Examination Guidelines: A complete evaluation includes B-mode imaging, spectral Doppler, color Doppler, and power Doppler as needed of all accessible portions of each vessel. Bilateral testing is considered an integral part of a complete examination. Limited examinations for reoccurring indications may be performed as noted. The reflux portion of the exam is performed with the patient in reverse  Trendelenburg.  +-----+---------------+---------+-----------+----------+--------------+ RIGHTCompressibilityPhasicitySpontaneityPropertiesThrombus Aging +-----+---------------+---------+-----------+----------+--------------+ CFV  Full           Yes      Yes                                 +-----+---------------+---------+-----------+----------+--------------+   +---------+---------------+---------+-----------+----------+--------------+ LEFT  CompressibilityPhasicitySpontaneityPropertiesThrombus Aging +---------+---------------+---------+-----------+----------+--------------+ CFV      None                    No                   Acute          +---------+---------------+---------+-----------+----------+--------------+ SFJ      Full                                                        +---------+---------------+---------+-----------+----------+--------------+ FV Prox  None                                         Acute          +---------+---------------+---------+-----------+----------+--------------+ FV Mid   None                                         Acute          +---------+---------------+---------+-----------+----------+--------------+ FV DistalNone                                         Acute          +---------+---------------+---------+-----------+----------+--------------+ POP      None                    No                   Acute          +---------+---------------+---------+-----------+----------+--------------+ PTV      Full                    Yes                                 +---------+---------------+---------+-----------+----------+--------------+ EIV                              Yes        Patent                   +---------+---------------+---------+-----------+----------+--------------+   Left Technical Findings: Not visualized segments include PFV, peroneal veins, left common iliac vein, IVC, limited  visualization left EIV.   Summary: RIGHT: - No evidence of common femoral vein obstruction.  LEFT: - Findings consistent with acute deep vein thrombosis involving the left common femoral vein, left femoral vein, and left popliteal vein. - No cystic structure found in the popliteal fossa.  *See table(s) above for measurements and observations.    Preliminary         Scheduled Meds: . sodium chloride flush  3 mL Intravenous Once   Continuous Infusions: . ceFEPime (MAXIPIME) IV Stopped (03/17/20 1117)  . heparin 1,850 Units/hr (03/17/20 0559)  . vancomycin       LOS: 0 days    Time spent: 30 minutes    Barb Merino, MD  [facility] Pager [phone]

## 2020-03-17 NOTE — Progress Notes (Signed)
ANTICOAGULATION CONSULT NOTE - Initial Consult  Pharmacy Consult for Heparin Indication: DVT  Not on File  Patient Measurements: Height: 6\' 4"  (193 cm) Weight: (!) 138.3 kg (305 lb) IBW/kg (Calculated) : 86.8 Heparin Dosing Weight: 115 kg  Vital Signs: Temp: 98.9 F (37.2 C) (04/21 2252) Temp Source: Oral (04/21 2252) BP: 103/59 (04/22 0245) Pulse Rate: 90 (04/22 0245)  Labs: Recent Labs    03/16/20 1841  HGB 14.4  HCT 43.7  PLT 570*  LABPROT 16.9*  INR 1.4*  CREATININE 1.04    Estimated Creatinine Clearance: 162.1 mL/min (by C-G formula based on SCr of 1.04 mg/dL).   Medical History: No past medical history on file.  Medications:  Lyrica  Valtrex  Xarelto  Assessment: 29 y.o. male with recent DVT, Xarelto started 4/19, for heparin.  Last dose of Xarelto AM 4/21  Goal of Therapy:  APTT 66-102 sec Heparin level 0.3-0.7 units/ml Monitor platelets by anticoagulation protocol: Yes   Plan:  Start heparin 1850 units/hr APTT in 8 hours  Jered Heiny, [place] Curb 03/17/2020,3:54 AM

## 2020-03-17 NOTE — ED Notes (Signed)
[facility] called to transport pt to 2W09

## 2020-03-17 NOTE — H&P (Signed)
History and Physical    Noah Moore [id number] DOB: 1991/08/11 DOA: 03/16/2020  PCP: Patient, No Pcp Per Patient coming from: Home  Chief Complaint: Blood clot in leg, bleeding gums  HPI: Noah Moore is a 29 y.o. male with medical history significant of recently diagnosed left lower extremity DVT currently on Xarelto presented to the ED with complaints of worsening left leg pain and bleeding of his gums.  Patient reports 2-week history of left lower extremity swelling.  States he had a minor injury to his leg 2 weeks ago which he felt was due to pulling a muscle.  His leg became swollen soon after.  Now his leg is very painful even with minor touch.  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  He did go on a 7.5 hour trip to [place] on Easter.  Denies fevers, chills, shortness of breath, or pleuritic chest pain.  States his right index finger has been swollen for a week.  He used to previously bite his nails but is no longer doing so.  Denies any injury to the finger.  He has also noticed that he has ulcers in his mouth for the past 2 weeks.  States he was diagnosed with DVT in his left leg 3 days ago and started on Xarelto and since then his gums have been bleeding when he brushes his teeth.  Denies prior personal history or family history of blood clots.  He uses e-cigarettes.  Denies history of drug use.  ED Course: Febrile with temperature 101.1 F.  Slightly tachycardic.  Not hypoxic.  WBC count 14.2.  Hemoglobin normal.  Platelet count 570, no prior labs for comparison.  Lactic acid normal.  UA not suggestive of infection.  SARS-CoV-2 PCR test and influenza panel pending.  Group A strep PCR pending.  Chest x-ray showing no active cardiopulmonary disease.  Noted to have discoloration of the left lower extremity concerning for phlegmasia.  Ultrasound done in the ED confirmed a DVT in the left common femoral, femoral, and popliteal  veins. He was started on IV heparin.  Also noted to have paronychia of the right index finger and oral lesions.  Patient received ceftriaxone.  Review of Systems:  All systems reviewed and apart from history of presenting illness, are negative.  Past Medical History:  Diagnosis Date  . DVT (deep venous thrombosis) ([place])     Past Surgical History:  Procedure Laterality Date  . LEG SURGERY     Reports history of right lower extremity tibia fracture in 2010 which was repaired surgically.     reports that he has been smoking e-cigarettes. He has never used smokeless tobacco. He reports previous alcohol use. He reports that he does not use drugs.  Not on File  History reviewed. No pertinent family history.  Prior to Admission medications   Medication Sig Start Date End Date Taking? Authorizing Provider  cetirizine (ZYRTEC) 10 MG tablet Take 1 tablet by mouth daily.   Yes [provider]  traMADol (ULTRAM) 50 MG tablet Take 50 mg by mouth every 6 (six) hours as needed for moderate pain. 03/14/20  Yes [provider]  Dollene Primrose PACK Take 1-2 tablets by mouth See admin instructions. Take as directed. 03/14/20  Yes [provider]    Physical Exam: Vitals:   03/17/20 0245 03/17/20 0315 03/17/20 0500 03/17/20 0643  BP: (!) 103/59 115/80 126/89 119/80  Pulse: 90 95 (!)  101 93  Resp:      Temp:      TempSrc:      SpO2: 97% 96% 99% 97%  Weight:      Height:        Physical Exam  Constitutional: He is oriented to person, place, and time. He appears well-developed and well-nourished. No distress.  HENT:  Head: Normocephalic.  Canker sores noted on oral mucosa No oral mucosal bleeding  Eyes: Right eye exhibits no discharge. Left eye exhibits no discharge.  Cardiovascular: Normal rate, regular rhythm and intact distal pulses.  Pulmonary/Chest: Effort normal and breath sounds normal. No respiratory distress. He has no wheezes. He has no rales.    Abdominal: Soft. Bowel sounds are normal. He exhibits no distension. There is no abdominal tenderness. There is no guarding.  Musculoskeletal:        General: Edema present.     Cervical back: Neck supple.     Comments: Left lower extremity edematous, warm to touch, and erythematous. Dorsalis pedis pulse intact bilaterally No signs of gangrene  Neurological: He is alert and oriented to person, place, and time.  Skin: Skin is warm and dry. He is not diaphoretic.  Right index finger significantly swollen and erythematous around the nailbed     Labs on Admission: I have personally reviewed following labs and imaging studies  CBC: Recent Labs  Lab 03/16/20 1841  WBC 14.2*  NEUTROABS 10.7*  HGB 14.4  HCT 43.7  MCV 87.2  PLT [id number]*   Basic Metabolic Panel: Recent Labs  Lab 03/16/20 1841  NA 135  K 4.1  CL 100  CO2 24  GLUCOSE 102*  BUN 8  CREATININE 1.04  CALCIUM 9.2   GFR: Estimated Creatinine Clearance: 162.1 mL/min (by C-G formula based on SCr of 1.04 mg/dL). Liver Function Tests: Recent Labs  Lab 03/16/20 1841  AST 26  ALT 49*  ALKPHOS 126  BILITOT 1.3*  PROT 8.3*  ALBUMIN 3.6   No results for input(s): LIPASE, AMYLASE in the last 168 hours. No results for input(s): AMMONIA in the last 168 hours. Coagulation Profile: Recent Labs  Lab 03/16/20 1841 03/17/20 0413  INR 1.4* 1.4*   Cardiac Enzymes: No results for input(s): CKTOTAL, CKMB, CKMBINDEX, TROPONINI in the last 168 hours. BNP (last 3 results) No results for input(s): PROBNP in the last 8760 hours. HbA1C: No results for input(s): HGBA1C in the last 72 hours. CBG: No results for input(s): GLUCAP in the last 168 hours. Lipid Profile: No results for input(s): CHOL, HDL, LDLCALC, TRIG, CHOLHDL, LDLDIRECT in the last 72 hours. Thyroid Function Tests: No results for input(s): TSH, T4TOTAL, FREET4, T3FREE, THYROIDAB in the last 72 hours. Anemia Panel: No results for input(s): VITAMINB12, FOLATE,  FERRITIN, TIBC, IRON, RETICCTPCT in the last 72 hours. Urine analysis:    Component Value Date/Time   COLORURINE AMBER (A) 03/16/2020 1940   APPEARANCEUR CLEAR 03/16/2020 1940   LABSPEC 1.024 03/16/2020 1940   PHURINE 5.0 03/16/2020 1940   GLUCOSEU NEGATIVE 03/16/2020 1940   HGBUR NEGATIVE 03/16/2020 1940   BILIRUBINUR SMALL (A) 03/16/2020 1940   KETONESUR 5 (A) 03/16/2020 1940   PROTEINUR 30 (A) 03/16/2020 1940   NITRITE NEGATIVE 03/16/2020 1940   LEUKOCYTESUR NEGATIVE 03/16/2020 1940    Radiological Exams on Admission: DG Chest 2 View  Result Date: 03/16/2020 CLINICAL DATA:  29 year old male with left lower extremity pain. Recent blood clot. EXAM: CHEST - 2 VIEW COMPARISON:  None FINDINGS: The heart size and mediastinal contours  are within normal limits. Both lungs are clear. The visualized skeletal structures are unremarkable. IMPRESSION: No active cardiopulmonary disease. Electronically Signed   By: Anner Crete M.D.   On: 03/16/2020 19:11   VAS [place] LOWER EXTREMITY VENOUS (DVT) (ONLY [facility] & [facility])  Result Date: 03/16/2020  Lower Venous DVTStudy Indications: Pain, and recent RLE DVT diagnosis- unable to obtain films or report.  Limitations: Body habitus and poor ultrasound/tissue interface. Comparison Study: No prior study Performing Technologist: Maudry Mayhew MHA, RDMS, RVT, RDCS  Examination Guidelines: A complete evaluation includes B-mode imaging, spectral Doppler, color Doppler, and power Doppler as needed of all accessible portions of each vessel. Bilateral testing is considered an integral part of a complete examination. Limited examinations for reoccurring indications may be performed as noted. The reflux portion of the exam is performed with the patient in reverse Trendelenburg.  +-----+---------------+---------+-----------+----------+--------------+ RIGHTCompressibilityPhasicitySpontaneityPropertiesThrombus Aging  +-----+---------------+---------+-----------+----------+--------------+ CFV  Full           Yes      Yes                                 +-----+---------------+---------+-----------+----------+--------------+   +---------+---------------+---------+-----------+----------+--------------+ LEFT     CompressibilityPhasicitySpontaneityPropertiesThrombus Aging +---------+---------------+---------+-----------+----------+--------------+ CFV      None                    No                   Acute          +---------+---------------+---------+-----------+----------+--------------+ SFJ      Full                                                        +---------+---------------+---------+-----------+----------+--------------+ FV Prox  None                                         Acute          +---------+---------------+---------+-----------+----------+--------------+ FV Mid   None                                         Acute          +---------+---------------+---------+-----------+----------+--------------+ FV DistalNone                                         Acute          +---------+---------------+---------+-----------+----------+--------------+ POP      None                    No                   Acute          +---------+---------------+---------+-----------+----------+--------------+ PTV      Full                    Yes                                 +---------+---------------+---------+-----------+----------+--------------+  EIV                              Yes        Patent                   +---------+---------------+---------+-----------+----------+--------------+   Left Technical Findings: Not visualized segments include PFV, peroneal veins, left common iliac vein, IVC, limited visualization left EIV.   Summary: RIGHT: - No evidence of common femoral vein obstruction.  LEFT: - Findings consistent with acute deep vein thrombosis involving the  left common femoral vein, left femoral vein, and left popliteal vein. - No cystic structure found in the popliteal fossa.  *See table(s) above for measurements and observations.    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  Concern for phlegmasia given discoloration of the left lower extremity on exam.  No signs of ischemia or gangrene.  Ultrasound done in the ED confirmed a DVT in the left common femoral, femoral, and popliteal veins.  Plan is to continue anticoagulation with IV heparin.  Morphine as needed for pain.  Consult vascular surgery and IR in the morning to discuss possible thrombolysis or thrombectomy.  Will order CT angiogram to assess for possible PE given tachycardia.  Order hypercoagulable panel.  Fever/SIRS/right index finger paronychia with concern for abscess: Febrile and slightly tachycardic.  Labs showing mild leukocytosis.  Lactic acid normal.  Chest x-ray not suggestive of pneumonia.  UA not suggestive of infection.  SARS-CoV-2 PCR test negative.  Influenza panel negative.  Has oral lesions/canker sores on exam.  Also has right index finger paronychia with concern for abscess.  Fever and tachycardia could also possibly be related to venous thromboembolism. -Start vancomycin and cefepime.  Blood cultures pending.  Paronychia incision and drainage is being done by ED provider.  Group A strep PCR pending. Continue to monitor WBC count.  Thrombocytosis: Platelet count 570.  Continue to monitor.  DVT prophylaxis: Heparin Code Status: Full code Family Communication: No family available at this  time. Disposition Plan: Anticipate discharge after clinical improvement.  Currently needs IV heparin for left lower extremity DVT with concern for phlegmasia. Consults called: None Admission status: It is my clinical opinion that admission to INPATIENT is reasonable and necessary because of the expectation that this patient will require hospital care that crosses at least 2 midnights to treat this condition based on the medical complexity of the problems presented.  Given the aforementioned information, the predictability of an adverse outcome is felt to be significant.  The medical decision making on this patient was of high complexity and the patient is at high risk for clinical deterioration, therefore this is a level 3 visit.  Shela Leff MD [facility]  If 7PM-7AM, please contact night-coverage [email or web address]  03/17/2020, 7:05 AM

## 2020-03-17 NOTE — Consult Note (Addendum)
Hospital Consult    Reason for Consult:  Lower extremity DVT Requesting Physician:  Dr. Ninetta Lights MRN #:  [id number]  History of Present Illness: This is a 29 y.o. male who presents to the emergency department yesterday evening for an approximately 3-week history of left lower extremity edema and pain.  Evidently, secondary to skin rash, it was thought he may have shingles.  He sought reevaluation on Monday and was placed on Xarelto for left lower extremity DVT.  Symptoms persisted.  After initial evaluation here, a Duplex examination was performed yesterday evening and heparin infusion initiated.  The patient is interviewed and examined in the emergency department where his wife is at bedside.  He states he did sustain minor trauma described as a pulled muscle to his left leg.  He then developed significant edema and pain.  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  He is currently febrile.  Denies chills or shortness of breath.  The patient's medical history reveals no prior history of DVT or clotting disorder.  He is in good health with no history of cancer.  He has recently had influenza and has had several tests for COVID-19 which have all been negative.  He has not been vaccinated for COVID-19.  Family history is negative for DVT or clotting disorders. Past Medical History:  Diagnosis Date  . DVT (deep venous thrombosis) ([place])     Past Surgical History:  Procedure Laterality Date  . LEG SURGERY     Reports history of right lower extremity tibia fracture in 2010 which was repaired surgically.    Not on File  Prior to Admission medications   Medication Sig Start Date End Date Taking? Authorizing Provider  cetirizine (ZYRTEC) 10 MG tablet Take 1 tablet by mouth daily.   Yes [provider]  traMADol (ULTRAM) 50 MG tablet Take 50 mg by mouth every 6 (six) hours as needed for  moderate pain. 03/14/20  Yes [provider]  Dollene Primrose PACK Take 1-2 tablets by mouth See admin instructions. Take as directed. 03/14/20  Yes [provider]    Social History   Socioeconomic History  . Marital status: Single    Spouse name: Not on file  . Number of children: Not on file  . Years of education: Not on file  . Highest education level: Not on file  Occupational History  . Not on file  Tobacco Use  . Smoking status: Current Some Day Smoker    Types: E-cigarettes  . Smokeless tobacco: Never Used  Substance and Sexual Activity  . Alcohol use: Not Currently  . Drug use: Never  . Sexual activity: Not on file  Other Topics Concern  . Not on file  Social History Narrative  . Not on file   Social Determinants of Health   Financial Resource Strain:   . Difficulty of Paying Living Expenses:   Food Insecurity:   . Worried About Charity fundraiser in the Last Year:   . Arboriculturist in the Last Year:   Transportation Needs:   . Film/video editor (Medical):   Marland Kitchen Lack of Transportation (Non-Medical):   Physical Activity:   . Days of Exercise per Week:   . Minutes of Exercise per Session:   Stress:   . Feeling of Stress :   Social Connections:   . Frequency of Communication  with Friends and Family:   . Frequency of Social Gatherings with Friends and Family:   . Attends Religious Services:   . Active Member of Clubs or Organizations:   . Attends Archivist Meetings:   Marland Kitchen Marital Status:   Intimate Partner Violence:   . Fear of Current or Ex-Partner:   . Emotionally Abused:   Marland Kitchen Physically Abused:   . Sexually Abused:      History reviewed. No pertinent family history.  ROS: Otherwise negative unless mentioned in HPI  Physical Examination  Vitals:   03/17/20 1000 03/17/20 1100  BP: (!) 106/53 113/78  Pulse: 83 82  Resp:  20  Temp:    SpO2: 99% 97%   Body mass index is 37.13 kg/m.  General:  WDWN in NAD Gait:  Not observed HENT: WNL, normocephalic Pulmonary: normal non-labored breathing, Cardiac: regular, tele shows NSR Abdomen:  soft, NT/ND, no pulsatile masses Skin: with lower extremity erythematous lesions Vascular Exam/Pulses: 2+ brachial, radial and anterior tibial palpable pulses bilaterally Extremities: without ischemic changes, without Gangrene ,without open wounds; the left lower extremity is moderately edematous and erythematous.  His calf is soft.  He has active range of motion of both feet and sensation is intact Musculoskeletal: no muscle wasting or atrophy  Neurologic: A&O X 3;  No focal weakness or paresthesias are detected; speech is fluent/normal Psychiatric:  The pt has Normal affect. Lymph:  Unremarkable  CBC    Component Value Date/Time   WBC 14.2 (H) 03/16/2020 1841   RBC 5.01 03/16/2020 1841   HGB 14.4 03/16/2020 1841   HCT 43.7 03/16/2020 1841   PLT 570 (H) 03/16/2020 1841   MCV 87.2 03/16/2020 1841   MCH 28.7 03/16/2020 1841   MCHC 33.0 03/16/2020 1841   RDW 12.3 03/16/2020 1841   LYMPHSABS 2.0 03/16/2020 1841   MONOABS 1.3 (H) 03/16/2020 1841   EOSABS 0.0 03/16/2020 1841   BASOSABS 0.1 03/16/2020 1841    BMET    Component Value Date/Time   NA 135 03/16/2020 1841   K 4.1 03/16/2020 1841   CL 100 03/16/2020 1841   CO2 24 03/16/2020 1841   GLUCOSE 102 (H) 03/16/2020 1841   BUN 8 03/16/2020 1841   CREATININE 1.04 03/16/2020 1841   CALCIUM 9.2 03/16/2020 1841   GFRNONAA >60 03/16/2020 1841   GFRAA >60 03/16/2020 1841    COAGS: Lab Results  Component Value Date   INR 1.4 (H) 03/17/2020   INR 1.4 (H) 03/16/2020     Non-Invasive Vascular Imaging:   RIGHT:  - No evidence of common femoral vein obstruction.    LEFT:  - Findings consistent with acute deep vein thrombosis involving the left  common femoral vein, left femoral vein, and left popliteal vein.  - No cystic structure found in the popliteal fossa.   Statin:  No. Beta Blocker:   No. Aspirin:  No. ACEI:  No. ARB:  No. CCB use:  No Other antiplatelets/anticoagulants:  Yes.   rivroxiban   ASSESSMENT/PLAN: This is a 29 y.o. male with initial presentation of left lower extremity deep venous thrombosis involving left common femoral vein and left popliteal vein.  No evidence of arterial compromise.  He had recently undertaken a 7 and 1/2-hour car drive and spends many hours a day driving.  Maximal temperature 101.  Leukocytosis.  Normal electrolytes. Hemodynamically stable. Cefepime 2 grams and vancomycin 1250 mg every 8 hours initiated.  Recommend continued heparin infusion, hydration, anlagesics, elevation of lower extremity and compression  wrap.  We will continue to follow.  Barbie Banner PA-C [facility] [phone] 03/17/2020  12:03 PM   I have seen and evaluated the patient. I agree with the PA note as documented above.  29 year old male presents with extensive left lower extremity DVT.  He states that his left leg has been swollen for about 3 weeks.  He denies any associated trauma.  No previous history of thromboembolic events.  I did review his duplex and appears his external iliac vein does appear patent.  He has a palpable dorsalis pedis pulse and is motor or sensory intact.  No evidence of phlegmasia.  Would continue heparin drip with leg elevation and compression.  Probably needs a hypercoagulable work-up given his age.  I discussed options of percutaneous mechanical thrombectomy and/or lysis if he does not see significant improvement on on heparin to prevent post thrombotic syndrome.  We will see how he does through the weekend.  Marty Heck, MD [facility] [place] Office: [phone]

## 2020-03-18 LAB — COMPREHENSIVE METABOLIC PANEL
ALT: 42 U/L (ref 0–44)
AST: 24 U/L (ref 15–41)
Albumin: 3.4 g/dL — ABNORMAL LOW (ref 3.5–5.0)
Alkaline Phosphatase: 121 U/L (ref 38–126)
Anion gap: 10 (ref 5–15)
BUN: 9 mg/dL (ref 6–20)
CO2: 22 mmol/L (ref 22–32)
Calcium: 9.2 mg/dL (ref 8.9–10.3)
Chloride: 104 mmol/L (ref 98–111)
Creatinine, Ser: 0.98 mg/dL (ref 0.61–1.24)
GFR calc Af Amer: >60 mL/min (ref >60)
GFR calc non Af Amer: >60 mL/min (ref >60)
Glucose, Bld: 94 mg/dL (ref 70–99)
Potassium: 4.3 mmol/L (ref 3.5–5.1)
Sodium: 136 mmol/L (ref 135–145)
Total Bilirubin: 1.2 mg/dL (ref 0.3–1.2)
Total Protein: 8.2 g/dL — ABNORMAL HIGH (ref 6.5–8.1)

## 2020-03-18 LAB — CBC
HCT: 43.4 % (ref 39.0–52.0)
Hemoglobin: 14.2 g/dL (ref 13.0–17.0)
MCH: 28.3 pg (ref 26.0–34.0)
MCHC: 32.7 g/dL (ref 30.0–36.0)
MCV: 86.5 fL (ref 80.0–100.0)
Platelets: 481 10*3/uL — ABNORMAL HIGH (ref 150–400)
RBC: 5.02 MIL/uL (ref 4.22–5.81)
RDW: 12.2 % (ref 11.5–15.5)
WBC: 10.9 10*3/uL — ABNORMAL HIGH (ref 4.0–10.5)
nRBC: 0 % (ref 0.0–0.2)

## 2020-03-18 LAB — BETA-2-GLYCOPROTEIN I ABS, IGG/M/A
Beta-2 Glyco I IgG: <9 GPI IgG units (ref 0–20)
Beta-2-Glycoprotein I IgA: <9 GPI IgA units (ref 0–25)
Beta-2-Glycoprotein I IgM: <9 GPI IgM units (ref 0–32)

## 2020-03-18 LAB — APTT
aPTT: 41 seconds — ABNORMAL HIGH (ref 24–36)
aPTT: 39 seconds — ABNORMAL HIGH (ref 24–36)
aPTT: 136 seconds — ABNORMAL HIGH (ref 24–36)

## 2020-03-18 LAB — CARDIOLIPIN ANTIBODIES, IGG, IGM, IGA
Anticardiolipin IgA: <9 APL U/mL (ref 0–11)
Anticardiolipin IgG: <9 GPL U/mL (ref 0–14)
Anticardiolipin IgM: <9 MPL U/mL (ref 0–12)

## 2020-03-18 LAB — PROTEIN C ACTIVITY: Protein C Activity: 96 % (ref 73–180)

## 2020-03-18 LAB — HOMOCYSTEINE: Homocysteine: 14.1 umol/L (ref 0.0–14.5)

## 2020-03-18 LAB — MAGNESIUM: Magnesium: 2.4 mg/dL (ref 1.7–2.4)

## 2020-03-18 LAB — PROTEIN S ACTIVITY: Protein S Activity: 115 % (ref 63–140)

## 2020-03-18 LAB — HEPARIN LEVEL (UNFRACTIONATED): Heparin Unfractionated: 0.49 IU/mL (ref 0.30–0.70)

## 2020-03-18 LAB — PHOSPHORUS: Phosphorus: 3.2 mg/dL (ref 2.5–4.6)

## 2020-03-18 LAB — PROTEIN S, TOTAL: Protein S Ag, Total: 94 % (ref 60–150)

## 2020-03-18 MED ORDER — APIXABAN 5 MG PO TABS
10.0000 mg | ORAL_TABLET | Freq: Two times a day (BID) | ORAL | Status: DC
  Administered 2020-03-18 – 2020-03-19 (×2): 10 mg via ORAL
  Filled 2020-03-18 (×2): qty 2

## 2020-03-18 MED ORDER — APIXABAN 5 MG PO TABS: 5.0000 mg | ORAL_TABLET | Freq: Two times a day (BID) | ORAL | Status: DC

## 2020-03-18 NOTE — Progress Notes (Addendum)
  Progress Note    03/18/2020 7:52 AM * No surgery found *  Subjective: Reports much improvement in lower extremity swelling and pain.  He states he was able to sleep with his leg above his heart overnight.  Minor pain with passive left foot dorsiflexion.  Has not felt subjective fever or chills   Vitals:   03/17/20 2043 03/18/20 0100  BP: 118/77   Pulse: 80   Resp:    Temp: 98.1 F (36.7 C)   SpO2: 99% 100%    Physical Exam: Cardiac: Rate and rhythm are regular Lungs: To auscultation bilaterally Extremities: Left lower extremity much improved in terms of edema and erythema.  2+ anterior tibial pulse.  Active range of motion and intact sensation of left foot.   CBC    Component Value Date/Time   WBC 14.2 (H) 03/16/2020 1841   RBC 5.01 03/16/2020 1841   HGB 14.4 03/16/2020 1841   HCT 43.7 03/16/2020 1841   PLT 570 (H) 03/16/2020 1841   MCV 87.2 03/16/2020 1841   MCH 28.7 03/16/2020 1841   MCHC 33.0 03/16/2020 1841   RDW 12.3 03/16/2020 1841   LYMPHSABS 2.0 03/16/2020 1841   MONOABS 1.3 (H) 03/16/2020 1841   EOSABS 0.0 03/16/2020 1841   BASOSABS 0.1 03/16/2020 1841    BMET    Component Value Date/Time   NA 135 03/16/2020 1841   K 4.1 03/16/2020 1841   CL 100 03/16/2020 1841   CO2 24 03/16/2020 1841   GLUCOSE 102 (H) 03/16/2020 1841   BUN 8 03/16/2020 1841   CREATININE 1.04 03/16/2020 1841   CALCIUM 9.2 03/16/2020 1841   GFRNONAA >60 03/16/2020 1841   GFRAA >60 03/16/2020 1841     Intake/Output Summary (Last 24 hours) at 03/18/2020 0752 Last data filed at 03/18/2020 0500 Gross per 24 hour  Intake 1340.56 ml  Output 500 ml  Net 840.56 ml    HOSPITAL MEDICATIONS Scheduled Meds: . sodium chloride flush  3 mL Intravenous Once   Continuous Infusions: . ceFEPime (MAXIPIME) IV 2 g (03/17/20 2333)  . heparin 2,300 Units/hr (03/18/20 0206)  . vancomycin 1,250 mg (03/18/20 0205)   PRN Meds:.acetaminophen **OR** acetaminophen, morphine  injection  Assessment:  29 y.o. male is s/p:  Left lower extremity DVT.  Significant improvement in edema and pain. Has remained afebrile since earlier temp at admission  Plan:  Continue heparin infusion and elevation. We will order compression stocking for left lower extremity - -DVT prophylaxis: Heparin infusion   Risa Grill, PA-C [facility] [phone] 03/18/2020  7:52 AM   I have seen and evaluated the patient. I agree with the PA note as documented above.  29 year old male seen for extensive left lower extremity DVT.  He has had significant improvement on heparin overnight.  Easily palpable pulse and no signs of phlegmasia.  We will continue heparin with leg elevation and wrapping.  Hypercoagulable work-up per primary team.  Discussed we will see how he continues to improve through the weekend and ultimately can be transitioned to a DOAC and discharged with a compression stocking unless he feels the leg is getting worse and then would evaluate for percutaneous mechanical venous intervention.  Discussed this is primarily to prevent post thrombotic syndrome.  Left external iliac vein appeared patent on duplex.  Marty Heck, MD [facility] [place] Office: [phone]

## 2020-03-18 NOTE — Progress Notes (Signed)
ANTICOAGULATION CONSULT NOTE - Follow-Up Consult  Pharmacy Consult for Heparin Indication: DVT  Not on File  Patient Measurements: Height: 6\' 4"  (193 cm) Weight: (!) 138.3 kg (305 lb) IBW/kg (Calculated) : 86.8 Heparin Dosing Weight: 117.5 kg  Vital Signs: Temp: 98.2 F (36.8 C) (04/23 0800) Temp Source: Oral (04/23 0800) BP: 113/70 (04/23 0800) Pulse Rate: 86 (04/23 0800)  Labs: Recent Labs    03/16/20 1841 03/17/20 0413 03/17/20 0413 03/17/20 1604 03/17/20 2322 03/18/20 0958  HGB 14.4  --   --   --   --  14.2  HCT 43.7  --   --   --   --  43.4  PLT 570*  --   --   --   --  481*  APTT  --  36   < > 59* 41* 39*  LABPROT 16.9* 17.1*  --   --   --   --   INR 1.4* 1.4*  --   --   --   --   HEPARINUNFRC  --  1.02*  --   --   --  0.49  CREATININE 1.04  --   --   --   --  0.98   < > = values in this interval not displayed.    Estimated Creatinine Clearance: 172 mL/min (by C-G formula based on SCr of 0.98 mg/dL).   Medical History: Past Medical History:  Diagnosis Date  . DVT (deep venous thrombosis) (HCC)     Medications:  Lyrica  Valtrex  Xarelto  Assessment: 29 y.o. male with recent DVT in LLE per dopplers, Xarelto started 4/19, for heparin.  Last dose of Xarelto AM 4/21. Pharmacy asked to dose heparin.  APTT remains SUBtherapeutic despite a rate increase earlier today (aPTT 36 << 41). The heparin level appears to be at goal but was falsely elevated on admission due to recent Xarelto use - so not correlating with aPTT levels at this time. CBC stable - no bleeding noted.   Upon discussing with the RN - the bag was changed out around 0830 and was dry at that time - it is unclear how long it was out for but based on when the last bag was charted the drip might have been dry by 0630 - will increase but not aggressively given this information.   Goal of Therapy:  APTT 66-102 sec Heparin level 0.3-0.7 units/ml Monitor platelets by anticoagulation protocol: Yes    Plan:  - Increase Heparin infusion to 2450 units/hr (24.5 ml/hr) - Will continue to monitor for any signs/symptoms of bleeding and will follow up with aPTT in 6 hours   Thank you for allowing pharmacy to be a part of this patient's care.  Alycia Rossetti, PharmD, BCPS Clinical Pharmacist Clinical phone for 03/18/2020: [phone] 03/18/2020 11:08 AM   **Pharmacist phone directory can now be found on [email or web address] ([email or web address]).  Listed under [place].

## 2020-03-18 NOTE — Progress Notes (Signed)
ANTICOAGULATION CONSULT NOTE - Follow-Up Consult  Pharmacy Consult for apixaban Indication: DVT  Not on File  Patient Measurements: Height: 6\' 4"  (193 cm) Weight: (!) 138.3 kg (305 lb) IBW/kg (Calculated) : 86.8 Heparin Dosing Weight: 117.5 kg  Vital Signs: Temp: 97.9 F (36.6 C) (04/23 1652) Temp Source: Oral (04/23 1652) BP: 140/105 (04/23 1652) Pulse Rate: 79 (04/23 1652)  Labs: Recent Labs    03/16/20 1841 03/17/20 0413 03/17/20 1604 03/17/20 2322 03/18/20 0958 03/18/20 1647  HGB 14.4  --   --   --  14.2  --   HCT 43.7  --   --   --  43.4  --   PLT 570*  --   --   --  481*  --   APTT  --  36   < > 41* 39* 136*  LABPROT 16.9* 17.1*  --   --   --   --   INR 1.4* 1.4*  --   --   --   --   HEPARINUNFRC  --  1.02*  --   --  0.49  --   CREATININE 1.04  --   --   --  0.98  --    < > = values in this interval not displayed.    Estimated Creatinine Clearance: 172 mL/min (by C-G formula based on SCr of 0.98 mg/dL).  Assessment: 29 y.o. male with recent DVT in LLE per dopplers, Xarelto started 4/19, for heparin.  Last dose of Xarelto AM 4/21.   Has been on heparin  Very large clot burden  Asked to switch to apixaban and re-load per MD   Plan:  Dc heparin  apixaban 10 mg bid x 1 week then 5 mg bid  Barth Kirks, PharmD, BCPS, BCCCP Clinical Pharmacist [phone]  Please check AMION for all [facility] numbers  03/18/2020 5:52 PM

## 2020-03-18 NOTE — Progress Notes (Signed)
PROGRESS NOTE    Noah Moore  [id number] DOB: July 09, 1991 DOA: 03/16/2020 PCP: Patient, No Pcp Per    Brief Narrative:  29 year old gentleman with no medical issues, recently diagnosed left lower lobe extremity DVT and is started on Xarelto presents to the emergency room with worsening left leg pain and bleeding from his gums on brushing the teeth.  Patient does not have any medical history, however he does drive long hours.  Easter weekend, he drove for 8 hours following which he started noticing some redness on his medial aspect of the left leg and he started hurting.  Was seen by provider, thought to be shingles and treated with antiviral.  His leg becomes more painful, went to see his primary care physician in [facility] on 4/19 where he was diagnosed with left lower leg DVT and discharged home with Xarelto.  After he started taking Xarelto, he continues to have swelling and discoloration, unable to walk.  He also complained of gums bleeding when he brushes his teeth.  So he came to the ER.  In the emergency room, temperature 101.  Slightly tachycardic.  Not hypoxic.  On room air.  WBC count 14,000.  Hemoglobin normal.  COVID-19 and influenza panel negative.  Streptococcal antigen Negative.  Patient is started on IV heparin and IV antibiotics and admitted to the hospital due to intolerance of oral anticoagulation.   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 Started on heparin infusion. Tolerating well. Symptomatically improving. Due to multiple DVTs, vascular surgery consulted, suggested conservative management. Continue heparin today. Mobility with compression  stockings. If continues to do good improvement in symptom control, will change to Eliquis tomorrow morning and discharge home with outpatient follow-up. CT scan of the chest with no evidence of pulmonary embolism. His DVT is probably precipitated due to prolonged travel.  No other obvious cause. Multiple testing for genetic causes has been sent, will follow up results. Will recommend 3 to 6 months of anticoagulation and follow-up.  SIRS syndrome: Probably related to #1.  No other evidence of localized bacterial infection other than a small paronychia in his finger. Discontinue antibiotics. Hot compress and local wound care for paronychia. WBC normalized.  Ambulated in the hallway.  DVT prophylaxis: Heparin infusion Code Status: Full code Family Communication: Fianc at the bedside Disposition Plan: Status is: Inpatient  Remains inpatient appropriate because:Inpatient level of care appropriate due to severity of illness   Dispo: The patient is from: Home              Anticipated d/c is to: Home              Anticipated d/c date is: 2 days              Patient currently is not medically stable to d/c.          Consultants:   Vascular surgery  Procedures:   None  Antimicrobials:  Antibiotics Given (last 72 hours)    Date/Time Action Medication Dose Rate   03/17/20 0353 New Bag/Given   cefTRIAXone (ROCEPHIN) 1 g in sodium chloride 0.9 % 100 mL IVPB 1 g 200 mL/hr   03/17/20 1019 New Bag/Given  ceFEPIme (MAXIPIME) 2 g in sodium chloride 0.9 % 100 mL IVPB 2 g 200 mL/hr   03/17/20 1112 New Bag/Given   vancomycin (VANCOREADY) IVPB 2000 mg/400 mL 2,000 mg 200 mL/hr   03/17/20 1729 New Bag/Given   ceFEPIme (MAXIPIME) 2 g in sodium chloride 0.9 % 100 mL IVPB 2 g 200 mL/hr   03/17/20 1833 New Bag/Given   vancomycin (VANCOREADY) IVPB 1250 mg/250 mL 1,250 mg 166.7 mL/hr   03/17/20 2333 New Bag/Given   ceFEPIme (MAXIPIME) 2 g in sodium chloride 0.9 % 100 mL IVPB 2 g 200 mL/hr    03/18/20 0205 New Bag/Given   vancomycin (VANCOREADY) IVPB 1250 mg/250 mL 1,250 mg 166.7 mL/hr   03/18/20 0826 New Bag/Given   ceFEPIme (MAXIPIME) 2 g in sodium chloride 0.9 % 100 mL IVPB 2 g 200 mL/hr         Subjective: Patient seen and examined. No overnight events. His left leg feels much better. Urine is clear. His pain is manageable and able to lift up his legs.  Objective: Vitals:   03/17/20 1554 03/17/20 2043 03/18/20 0100 03/18/20 0800  BP: 127/76 118/77  113/70  Pulse: 80 80  86  Resp:      Temp: 98.9 F (37.2 C) 98.1 F (36.7 C)  98.2 F (36.8 C)  TempSrc: Oral Oral  Oral  SpO2: 100% 99% 100% 97%  Weight:      Height:        Intake/Output Summary (Last 24 hours) at 03/18/2020 1252 Last data filed at 03/18/2020 1031 Gross per 24 hour  Intake 1518.24 ml  Output 1650 ml  Net -131.76 ml   Filed Weights   03/16/20 1813  Weight: (!) 138.3 kg    Examination:  General exam: Appears calm and comfortable, not in any distress. Respiratory system: Clear to auscultation. Respiratory effort normal.  No added sounds. Cardiovascular system: S1 & S2 heard, RRR. No JVD, murmurs, rubs, gallops or clicks.  Gastrointestinal system: Abdomen is nondistended, soft and nontender. No organomegaly or masses felt. Normal bowel sounds heard. Central nervous system: Alert and oriented. No focal neurological deficits. Extremities: Symmetric 5 x 5 power. Psychiatry: Judgement and insight appear normal. Mood & affect appropriate.  Left leg: Minimal tenderness along the calf muscles. Distal neurovascular status intact.  Distal pulses are palpable.   Data Reviewed: I have personally reviewed following labs and imaging studies  CBC: Recent Labs  Lab 03/16/20 1841 03/18/20 0958  WBC 14.2* 10.9*  NEUTROABS 10.7*  --   HGB 14.4 14.2  HCT 43.7 43.4  MCV 87.2 86.5  PLT 570* 123XX123*   Basic Metabolic Panel: Recent Labs  Lab 03/16/20 1841 03/18/20 0958  NA 135 136  K 4.1 4.3  CL  100 104  CO2 24 22  GLUCOSE 102* 94  BUN 8 9  CREATININE 1.04 0.98  CALCIUM 9.2 9.2  MG  --  2.4  PHOS  --  3.2   GFR: Estimated Creatinine Clearance: 172 mL/min (by C-G formula based on SCr of 0.98 mg/dL). Liver Function Tests: Recent Labs  Lab 03/16/20 1841 03/18/20 0958  AST 26 24  ALT 49* 42  ALKPHOS 126 121  BILITOT 1.3* 1.2  PROT 8.3* 8.2*  ALBUMIN 3.6 3.4*   No results for input(s): LIPASE, AMYLASE in the last 168 hours. No results for input(s): AMMONIA in the last 168 hours. Coagulation Profile: Recent Labs  Lab 03/16/20 1841 03/17/20 0413  INR 1.4* 1.4*   Cardiac Enzymes: No results  for input(s): CKTOTAL, CKMB, CKMBINDEX, TROPONINI in the last 168 hours. BNP (last 3 results) No results for input(s): PROBNP in the last 8760 hours. HbA1C: No results for input(s): HGBA1C in the last 72 hours. CBG: No results for input(s): GLUCAP in the last 168 hours. Lipid Profile: No results for input(s): CHOL, HDL, LDLCALC, TRIG, CHOLHDL, LDLDIRECT in the last 72 hours. Thyroid Function Tests: No results for input(s): TSH, T4TOTAL, FREET4, T3FREE, THYROIDAB in the last 72 hours. Anemia Panel: No results for input(s): VITAMINB12, FOLATE, FERRITIN, TIBC, IRON, RETICCTPCT in the last 72 hours. Sepsis Labs: Recent Labs  Lab 03/16/20 1841 03/17/20 0746  LATICACIDVEN 0.9 1.7    Recent Results (from the past 240 hour(s))  Respiratory Panel by RT PCR (Flu A&B, Covid) - Throat     Status: None   Collection Time: 03/17/20  4:13 AM   Specimen: Throat  Result Value Ref Range Status   SARS Coronavirus 2 by RT PCR NEGATIVE NEGATIVE Final    Comment: (NOTE) SARS-CoV-2 target nucleic acids are NOT DETECTED. The SARS-CoV-2 RNA is generally detectable in upper respiratoy specimens during the acute phase of infection. The lowest concentration of SARS-CoV-2 viral copies this assay can detect is 131 copies/mL. A negative result does not preclude SARS-Cov-2 infection and should  not be used as the sole basis for treatment or other patient management decisions. A negative result may occur with  improper specimen collection/handling, submission of specimen other than nasopharyngeal swab, presence of viral mutation(s) within the areas targeted by this assay, and inadequate number of viral copies (<131 copies/mL). A negative result must be combined with clinical observations, patient history, and epidemiological information. The expected result is Negative. Fact Sheet for Patients:  PinkCheek.be Fact Sheet for Healthcare Providers:  GravelBags.it This test is not yet ap proved or cleared by the [place] FDA and  has been authorized for detection and/or diagnosis of SARS-CoV-2 by FDA under an Emergency Use Authorization (EUA). This EUA will remain  in effect (meaning this test can be used) for the duration of the COVID-19 declaration under Section 564(b)(1) of the Act, 21 U.S.C. section 360bbb-3(b)(1), unless the authorization is terminated or revoked sooner.    Influenza A by PCR NEGATIVE NEGATIVE Final   Influenza B by PCR NEGATIVE NEGATIVE Final    Comment: (NOTE) The Xpert Xpress SARS-CoV-2/FLU/RSV assay is intended as an aid in  the diagnosis of influenza from Nasopharyngeal swab specimens and  should not be used as a sole basis for treatment. Nasal washings and  aspirates are unacceptable for Xpert Xpress SARS-CoV-2/FLU/RSV  testing. Fact Sheet for Patients: PinkCheek.be Fact Sheet for Healthcare Providers: GravelBags.it This test is not yet approved or cleared by the [place] FDA and  has been authorized for detection and/or diagnosis of SARS-CoV-2 by  FDA under an Emergency Use Authorization (EUA). This EUA will remain  in effect (meaning this test can be used) for the duration of the  Covid-19 declaration under Section 564(b)(1)  of the Act, 21  U.S.C. section 360bbb-3(b)(1), unless the authorization is  terminated or revoked. Performed at [facility], [place] [address], [place], [place] [address]   Group A Strep by PCR     Status: None   Collection Time: 03/17/20  4:40 AM  Result Value Ref Range Status   Group A Strep by PCR NOT DETECTED NOT DETECTED Final    Comment: Performed at [facility], [place] [address], [place], [place] [address]  Blood culture (  routine x 2)     Status: None (Preliminary result)   Collection Time: 03/17/20  5:49 AM   Specimen: BLOOD LEFT ARM  Result Value Ref Range Status   Specimen Description BLOOD LEFT ARM  Final   Special Requests   Final    BOTTLES DRAWN AEROBIC AND ANAEROBIC Blood Culture results may not be optimal due to an excessive volume of blood received in culture bottles Performed at [facility] [address], [place], [place] [address]    Culture PENDING  Incomplete   Report Status PENDING  Incomplete  Blood culture (routine x 2)     Status: None (Preliminary result)   Collection Time: 03/17/20  5:49 AM   Specimen: BLOOD RIGHT ARM  Result Value Ref Range Status   Specimen Description BLOOD RIGHT ARM  Final   Special Requests   Final    BOTTLES DRAWN AEROBIC AND ANAEROBIC Blood Culture results may not be optimal due to an excessive volume of blood received in culture bottles Performed at [facility], [place] [address], [place], [place] [address]    Culture PENDING  Incomplete   Report Status PENDING  Incomplete         Radiology Studies: DG Chest 2 View  Result Date: 03/16/2020 CLINICAL DATA:  29 year old male with left lower extremity pain. Recent blood clot. EXAM: CHEST - 2 VIEW COMPARISON:  None FINDINGS: The heart size and mediastinal contours are within normal limits. Both lungs are clear. The visualized skeletal structures are unremarkable. IMPRESSION: No active cardiopulmonary disease. Electronically Signed   By: Anner Crete  M.D.   On: 03/16/2020 19:11   CT ANGIO CHEST PE W OR WO CONTRAST  Result Date: 03/17/2020 CLINICAL DATA:  Left leg pain and swelling with history of DVT. EXAM: CT ANGIOGRAPHY CHEST WITH CONTRAST TECHNIQUE: Multidetector CT imaging of the chest was performed using the standard protocol during bolus administration of intravenous contrast. Multiplanar CT image reconstructions and MIPs were obtained to evaluate the vascular anatomy. CONTRAST:  33mL OMNIPAQUE IOHEXOL 350 MG/ML SOLN COMPARISON:  None. FINDINGS: Cardiovascular: The heart is normal in size. No pericardial effusion. The aorta is normal in caliber. No dissection. No atherosclerotic calcifications. The branch vessels are patent. The left vertebral artery comes directly off the aorta. No coronary artery calcifications. The pulmonary arterial tree is fairly well opacified. No definite filling defects to suggest pulmonary embolism. Mediastinum/Nodes: No mediastinal or hilar mass or adenopathy. Small scattered lymph nodes are noted. The esophagus is grossly normal. The thyroid gland is unremarkable. Lungs/Pleura: The lungs are clear. No pleural effusion or pulmonary lesions. Upper Abdomen: No significant upper abdominal findings. Musculoskeletal: No chest wall mass, supraclavicular or axillary adenopathy. The bony thorax is intact. Review of the MIP images confirms the above findings. IMPRESSION: 1. No CT findings for pulmonary embolism. 2. Normal thoracic aorta. 3. No acute pulmonary findings or worrisome pulmonary lesions. Electronically Signed   By: Marijo Sanes M.D.   On: 03/17/2020 07:35   VAS [place] LOWER EXTREMITY VENOUS (DVT) (ONLY [facility] & [facility])  Result Date: 03/17/2020  Lower Venous DVTStudy Indications: Pain, and recent RLE DVT diagnosis- unable to obtain films or report.  Limitations: Body habitus and poor ultrasound/tissue interface. Comparison Study: No prior study Performing Technologist: Maudry Mayhew MHA, RDMS, RVT, RDCS  Examination  Guidelines: A complete evaluation includes B-mode imaging, spectral Doppler, color Doppler, and power Doppler as needed of all accessible portions of each vessel. Bilateral testing is considered an integral  part of a complete examination. Limited examinations for reoccurring indications may be performed as noted. The reflux portion of the exam is performed with the patient in reverse Trendelenburg.  +-----+---------------+---------+-----------+----------+--------------+ RIGHTCompressibilityPhasicitySpontaneityPropertiesThrombus Aging +-----+---------------+---------+-----------+----------+--------------+ CFV  Full           Yes      Yes                                 +-----+---------------+---------+-----------+----------+--------------+   +---------+---------------+---------+-----------+----------+--------------+ LEFT     CompressibilityPhasicitySpontaneityPropertiesThrombus Aging +---------+---------------+---------+-----------+----------+--------------+ CFV      None                    No                   Acute          +---------+---------------+---------+-----------+----------+--------------+ SFJ      Full                                                        +---------+---------------+---------+-----------+----------+--------------+ FV Prox  None                                         Acute          +---------+---------------+---------+-----------+----------+--------------+ FV Mid   None                                         Acute          +---------+---------------+---------+-----------+----------+--------------+ FV DistalNone                                         Acute          +---------+---------------+---------+-----------+----------+--------------+ POP      None                    No                   Acute          +---------+---------------+---------+-----------+----------+--------------+ PTV      Full                    Yes                                  +---------+---------------+---------+-----------+----------+--------------+ EIV                              Yes        Patent                   +---------+---------------+---------+-----------+----------+--------------+   Left Technical Findings: Not visualized segments include PFV, peroneal veins, left common iliac vein, IVC, limited visualization left EIV.   Summary: RIGHT: - No evidence of common femoral vein obstruction.  LEFT: - Findings consistent with acute deep vein thrombosis involving  the left common femoral vein, left femoral vein, and left popliteal vein. - No cystic structure found in the popliteal fossa.  *See table(s) above for measurements and observations. Electronically signed by Monica Martinez MD on 03/17/2020 at 8:02:59 PM.    Final         Scheduled Meds: . sodium chloride flush  3 mL Intravenous Once   Continuous Infusions: . heparin 2,450 Units/hr (03/18/20 1112)     LOS: 1 day    Time spent: 30 minutes    Barb Merino, MD [facility] Pager [phone]

## 2020-03-18 NOTE — Progress Notes (Signed)
ANTICOAGULATION CONSULT NOTE  Pharmacy Consult for Heparin Indication: DVT  Not on File  Patient Measurements: Height: 6\' 4"  (193 cm) Weight: (!) 138.3 kg (305 lb) IBW/kg (Calculated) : 86.8 Heparin Dosing Weight: 117.5 kg  Vital Signs: Temp: 98.1 F (36.7 C) (04/22 2043) Temp Source: Oral (04/22 2043) BP: 118/77 (04/22 2043) Pulse Rate: 80 (04/22 2043)  Labs: Recent Labs    03/16/20 1841 03/17/20 0413 03/17/20 1604 03/17/20 2322  HGB 14.4  --   --   --   HCT 43.7  --   --   --   PLT 570*  --   --   --   APTT  --  36 59* 41*  LABPROT 16.9* 17.1*  --   --   INR 1.4* 1.4*  --   --   HEPARINUNFRC  --  1.02*  --   --   CREATININE 1.04  --   --   --     Estimated Creatinine Clearance: 162.1 mL/min (by C-G formula based on SCr of 1.04 mg/dL).   Medical History: Past Medical History:  Diagnosis Date  . DVT (deep venous thrombosis) (HCC)     Medications:  Lyrica  Valtrex  Xarelto  Assessment: 29 y.o. male with recent DVT in LLE per dopplers, Xarelto started 4/19, for heparin.  Last dose of Xarelto AM 4/21. Pharmacy asked to dose heparin.  Initial aPTT subtherapeutic on drip rate 1850 units/hr. Will dose based off aPTT until levels correlate with HL. CBC this am was wnl, No overt bleeding or infusion issues noted.  4/23 AM update:  APTT remains low  Goal of Therapy:  APTT 66-102 sec Heparin level 0.3-0.7 units/ml Monitor platelets by anticoagulation protocol: Yes   Plan:  Increase heparin infusion to 2300 units/hr Re-check heparin level and aPTT in 6-8 hours Monitor daily aPTT, HL, cbc, s/sx bleeding  Narda Bonds, PharmD, BCPS Clinical Pharmacist Phone: [phone]

## 2020-03-19 DIAGNOSIS — I82402 Acute embolism and thrombosis of unspecified deep veins of left lower extremity: Secondary | ICD-10-CM

## 2020-03-19 LAB — COMPREHENSIVE METABOLIC PANEL
ALT: 45 U/L — ABNORMAL HIGH (ref 0–44)
AST: 23 U/L (ref 15–41)
Albumin: 3.4 g/dL — ABNORMAL LOW (ref 3.5–5.0)
Alkaline Phosphatase: 125 U/L (ref 38–126)
Anion gap: 11 (ref 5–15)
BUN: 7 mg/dL (ref 6–20)
CO2: 22 mmol/L (ref 22–32)
Calcium: 9.2 mg/dL (ref 8.9–10.3)
Chloride: 104 mmol/L (ref 98–111)
Creatinine, Ser: 0.7 mg/dL (ref 0.61–1.24)
GFR calc Af Amer: >60 mL/min (ref >60)
GFR calc non Af Amer: >60 mL/min (ref >60)
Glucose, Bld: 93 mg/dL (ref 70–99)
Potassium: 3.9 mmol/L (ref 3.5–5.1)
Sodium: 137 mmol/L (ref 135–145)
Total Bilirubin: 0.7 mg/dL (ref 0.3–1.2)
Total Protein: 8 g/dL (ref 6.5–8.1)

## 2020-03-19 LAB — LUPUS ANTICOAGULANT PANEL
DRVVT: 86.2 s — ABNORMAL HIGH (ref 0.0–47.0)
PTT Lupus Anticoagulant: 79.3 s — ABNORMAL HIGH (ref 0.0–51.9)

## 2020-03-19 LAB — HEXAGONAL PHASE PHOSPHOLIPID: Hexagonal Phase Phospholipid: 0 s (ref 0–11)

## 2020-03-19 LAB — DRVVT MIX: dRVVT Mix: 58.2 s — ABNORMAL HIGH (ref 0.0–40.4)

## 2020-03-19 LAB — DRVVT CONFIRM: dRVVT Confirm: 1.4 ratio — ABNORMAL HIGH (ref 0.8–1.2)

## 2020-03-19 LAB — PROTEIN C, TOTAL: Protein C, Total: 73 % (ref 60–150)

## 2020-03-19 LAB — PTT-LA MIX: PTT-LA Mix: 67.8 s — ABNORMAL HIGH (ref 0.0–48.9)

## 2020-03-19 MED ORDER — APIXABAN (ELIQUIS) VTE STARTER PACK (10MG AND 5MG): ORAL_TABLET | ORAL | 2 refills | Status: DC

## 2020-03-19 NOTE — Discharge Summary (Signed)
Physician Discharge Summary  Noah Moore [id number] DOB: [birth year] DOA: 03/16/2020  PCP: Patient, No Pcp Per  Admit date: 03/16/2020 Discharge date: 03/19/2020  Admitted From: Home Disposition: Home  Recommendations for Outpatient Follow-up:  1. Follow-up with vascular surgery as scheduled. 2. Will send to hematology oncology for follow-up and also follow-up with remaining test results.   Discharge Condition: Stable CODE STATUS: Full code Diet recommendation: Regular diet  Discharge summary: 29 year old gentleman with no medical issues, recently diagnosed left lower leg DVT and started on Xarelto presents to the emergency room with worsening left leg pain and bleeding from his gums on brushing the teeth.  Patient does not have any medical history, however he does drive long hours.  Easter weekend, he drove for 8 hours following which he started noticing some redness on his medial aspect of the left leg and he started hurting.  Was seen by provider, thought to be shingles and treated with antiviral.  His leg becomes more painful, went to see his primary care physician in [facility] on 4/19 where he was diagnosed with left lower leg DVT and discharged home with Xarelto.  After he started taking Xarelto, he continues to have swelling and discoloration, unable to walk.  He also complained of gums bleeding when he brushes his teeth.  So he came to the ER.  In the emergency room, temperature 101.  Slightly tachycardic.  Not hypoxic.  On room air.  WBC count 14,000.  Hemoglobin normal.  COVID-19 and influenza panel negative.  Streptococcal antigen Negative.  Patient was started on IV heparin and IV antibiotics and admitted to the hospital due to intolerance of oral anticoagulation.  # Acute DVT multiple vein left lower extremity:  with hematuria and gum bleeding. Started on heparin infusion. Tolerating well. Symptomatically improving. Due to multiple DVTs, vascular surgery  consulted, suggested conservative management. Most of his symptoms improved.  Local swelling improved.  Tolerated increased mobility. CT scan of the chest with no evidence of pulmonary embolism. His DVT is probably precipitated due to prolonged travel.  No other obvious cause. Multiple testing for genetic causes has been sent, most of the results are negative. Will recommend 3 to 6 months of anticoagulation and follow-up. Patient does not want to go back on Xarelto.  Will discharge with Eliquis starter pack and full dose anticoagulation. Follow-up at vascular surgery next week. Send referral to hematology for follow-up, decision about continuing anticoagulation as well as lab testing follow-up.  # SIRS syndrome: Probably related to #1.  No other evidence of localized bacterial infection other than a small paronychia in his finger.   Hot compress.  Local drainage.  No indication for antibiotic.  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)   SIRS (systemic inflammatory response syndrome) (HCC)   Paronychia of finger   Thrombocytosis (HCC)   Sepsis [facility])    Discharge Instructions  Discharge Instructions    Ambulatory referral to Hematology / Oncology   Complete by: As directed    DVT on anticoagulation for follow up   Call MD for:  difficulty breathing, headache or visual disturbances   Complete by: As directed    Call MD for:  redness, tenderness, or signs of infection (pain, swelling, redness, odor or green/yellow discharge around incision site)   Complete by: As directed    Call MD for:  severe uncontrolled pain   Complete by: As  directed    Diet general   Complete by: As directed    Increase activity slowly   Complete by: As directed      Allergies as of 03/19/2020   Not on File     Medication List    STOP taking these medications   Xarelto Starter  Pack Generic drug: Rivaroxaban Stater Pack (15 mg and 20 mg)     TAKE these medications   Apixaban Starter Pack (10mg  and 5mg ) Commonly known as: ELIQUIS STARTER PACK Take as directed on package: start with two-5mg  tablets twice daily for 7 days. On day 8, switch to one-5mg  tablet twice daily.   cetirizine 10 MG tablet Commonly known as: ZYRTEC Take 1 tablet by mouth daily.   traMADol 50 MG tablet Commonly known as: ULTRAM Take 50 mg by mouth every 6 (six) hours as needed for moderate pain.      Follow-up Information    Marty Heck, MD Follow up in 2 week(s).   Specialty: Vascular Surgery Why: office will call you , or you call if not heard  Contact information: [address] [place] [address] [phone]          Not on File  Consultations:  Vascular surgery   Procedures/Studies: DG Chest 2 View  Result Date: 03/16/2020 CLINICAL DATA:  29 year old male with left lower extremity pain. Recent blood clot. EXAM: CHEST - 2 VIEW COMPARISON:  None FINDINGS: The heart size and mediastinal contours are within normal limits. Both lungs are clear. The visualized skeletal structures are unremarkable. IMPRESSION: No active cardiopulmonary disease. Electronically Signed   By: Anner Crete M.D.   On: 03/16/2020 19:11   CT ANGIO CHEST PE W OR WO CONTRAST  Result Date: 03/17/2020 CLINICAL DATA:  Left leg pain and swelling with history of DVT. EXAM: CT ANGIOGRAPHY CHEST WITH CONTRAST TECHNIQUE: Multidetector CT imaging of the chest was performed using the standard protocol during bolus administration of intravenous contrast. Multiplanar CT image reconstructions and MIPs were obtained to evaluate the vascular anatomy. CONTRAST:  79mL OMNIPAQUE IOHEXOL 350 MG/ML SOLN COMPARISON:  None. FINDINGS: Cardiovascular: The heart is normal in size. No pericardial effusion. The aorta is normal in caliber. No dissection. No atherosclerotic calcifications. The branch vessels are  patent. The left vertebral artery comes directly off the aorta. No coronary artery calcifications. The pulmonary arterial tree is fairly well opacified. No definite filling defects to suggest pulmonary embolism. Mediastinum/Nodes: No mediastinal or hilar mass or adenopathy. Small scattered lymph nodes are noted. The esophagus is grossly normal. The thyroid gland is unremarkable. Lungs/Pleura: The lungs are clear. No pleural effusion or pulmonary lesions. Upper Abdomen: No significant upper abdominal findings. Musculoskeletal: No chest wall mass, supraclavicular or axillary adenopathy. The bony thorax is intact. Review of the MIP images confirms the above findings. IMPRESSION: 1. No CT findings for pulmonary embolism. 2. Normal thoracic aorta. 3. No acute pulmonary findings or worrisome pulmonary lesions. Electronically Signed   By: Marijo Sanes M.D.   On: 03/17/2020 07:35   VAS [place] LOWER EXTREMITY VENOUS (DVT) (ONLY [facility] & [facility])  Result Date: 03/17/2020  Lower Venous DVTStudy Indications: Pain, and recent RLE DVT diagnosis- unable to obtain films or report.  Limitations: Body habitus and poor ultrasound/tissue interface. Comparison Study: No prior study Performing Technologist: Maudry Mayhew MHA, RDMS, RVT, RDCS  Examination Guidelines: A complete evaluation includes B-mode imaging, spectral Doppler, color Doppler, and power Doppler as needed of all accessible portions of each vessel. Bilateral testing is  considered an integral part of a complete examination. Limited examinations for reoccurring indications may be performed as noted. The reflux portion of the exam is performed with the patient in reverse Trendelenburg.  +-----+---------------+---------+-----------+----------+--------------+ RIGHTCompressibilityPhasicitySpontaneityPropertiesThrombus Aging +-----+---------------+---------+-----------+----------+--------------+ CFV  Full           Yes      Yes                                  +-----+---------------+---------+-----------+----------+--------------+   +---------+---------------+---------+-----------+----------+--------------+ LEFT     CompressibilityPhasicitySpontaneityPropertiesThrombus Aging +---------+---------------+---------+-----------+----------+--------------+ CFV      None                    No                   Acute          +---------+---------------+---------+-----------+----------+--------------+ SFJ      Full                                                        +---------+---------------+---------+-----------+----------+--------------+ FV Prox  None                                         Acute          +---------+---------------+---------+-----------+----------+--------------+ FV Mid   None                                         Acute          +---------+---------------+---------+-----------+----------+--------------+ FV DistalNone                                         Acute          +---------+---------------+---------+-----------+----------+--------------+ POP      None                    No                   Acute          +---------+---------------+---------+-----------+----------+--------------+ PTV      Full                    Yes                                 +---------+---------------+---------+-----------+----------+--------------+ EIV                              Yes        Patent                   +---------+---------------+---------+-----------+----------+--------------+   Left Technical Findings: Not visualized segments include PFV, peroneal veins, left common iliac vein, IVC, limited visualization left EIV.   Summary: RIGHT: - No evidence of common femoral vein obstruction.  LEFT: - Findings consistent with acute deep  vein thrombosis involving the left common femoral vein, left femoral vein, and left popliteal vein. - No cystic structure found in the popliteal fossa.  *See table(s) above for  measurements and observations. Electronically signed by Monica Martinez MD on 03/17/2020 at 8:02:59 PM.    Final      Subjective: Patient was seen and examined.  No overnight events.  He did walk all around the hospital last night with compression stockings.  Has some pain that is anticipated. Discoloration and swelling has reduced. Right index finger tip now draining after hot compression.   Discharge Exam: Vitals:   03/18/20 2202 03/19/20 0908  BP: (!) 133/100 118/74  Pulse: 78 74  Resp: 18 18  Temp: 97.8 F (36.6 C) 98.1 F (36.7 C)  SpO2: 98% 97%   Vitals:   03/18/20 1647 03/18/20 1652 03/18/20 2202 03/19/20 0908  BP:  (!) 140/105 (!) 133/100 118/74  Pulse:  79 78 74  Resp:  20 18 18   Temp:  97.9 F (36.6 C) 97.8 F (36.6 C) 98.1 F (36.7 C)  TempSrc:  Oral Oral Oral  SpO2: 97% 97% 98% 97%  Weight:      Height:        General: Pt is alert, awake, not in acute distress, on room air. Cardiovascular: RRR, S1/S2 +, no rubs, no gallops Respiratory: CTA bilaterally, no wheezing, no rhonchi Abdominal: Soft, NT, ND, bowel sounds + Extremities: minimal edema left calf.  Discoloration and pigmentation improved.    The results of significant diagnostics from this hospitalization (including imaging, microbiology, ancillary and laboratory) are listed below for reference.     Microbiology: Recent Results (from the past 240 hour(s))  Respiratory Panel by RT PCR (Flu A&B, Covid) - Throat     Status: None   Collection Time: 03/17/20  4:13 AM   Specimen: Throat  Result Value Ref Range Status   SARS Coronavirus 2 by RT PCR NEGATIVE NEGATIVE Final    Comment: (NOTE) SARS-CoV-2 target nucleic acids are NOT DETECTED. The SARS-CoV-2 RNA is generally detectable in upper respiratoy specimens during the acute phase of infection. The lowest concentration of SARS-CoV-2 viral copies this assay can detect is 131 copies/mL. A negative result does not preclude SARS-Cov-2 infection and  should not be used as the sole basis for treatment or other patient management decisions. A negative result may occur with  improper specimen collection/handling, submission of specimen other than nasopharyngeal swab, presence of viral mutation(s) within the areas targeted by this assay, and inadequate number of viral copies (<131 copies/mL). A negative result must be combined with clinical observations, patient history, and epidemiological information. The expected result is Negative. Fact Sheet for Patients:  PinkCheek.be Fact Sheet for Healthcare Providers:  GravelBags.it This test is not yet ap proved or cleared by the [place] FDA and  has been authorized for detection and/or diagnosis of SARS-CoV-2 by FDA under an Emergency Use Authorization (EUA). This EUA will remain  in effect (meaning this test can be used) for the duration of the COVID-19 declaration under Section 564(b)(1) of the Act, 21 U.S.C. section 360bbb-3(b)(1), unless the authorization is terminated or revoked sooner.    Influenza A by PCR NEGATIVE NEGATIVE Final   Influenza B by PCR NEGATIVE NEGATIVE Final    Comment: (NOTE) The Xpert Xpress SARS-CoV-2/FLU/RSV assay is intended as an aid in  the diagnosis of influenza from Nasopharyngeal swab specimens and  should not be used as a sole basis for treatment. Nasal washings and  aspirates are unacceptable for Xpert Xpress SARS-CoV-2/FLU/RSV  testing. Fact Sheet for Patients: PinkCheek.be Fact Sheet for Healthcare Providers: GravelBags.it This test is not yet approved or cleared by the [place] FDA and  has been authorized for detection and/or diagnosis of SARS-CoV-2 by  FDA under an Emergency Use Authorization (EUA). This EUA will remain  in effect (meaning this test can be used) for the duration of the  Covid-19 declaration under Section  564(b)(1) of the Act, 21  U.S.C. section 360bbb-3(b)(1), unless the authorization is  terminated or revoked. Performed at [facility], [place] [address], [place], [place] [address]   Group A Strep by PCR     Status: None   Collection Time: 03/17/20  4:40 AM  Result Value Ref Range Status   Group A Strep by PCR NOT DETECTED NOT DETECTED Final    Comment: Performed at [facility], [place] [address] [place], [place] [address]  Blood culture (routine x 2)     Status: None (Preliminary result)   Collection Time: 03/17/20  5:49 AM   Specimen: BLOOD LEFT ARM  Result Value Ref Range Status   Specimen Description BLOOD LEFT ARM  Final   Special Requests   Final    BOTTLES DRAWN AEROBIC AND ANAEROBIC Blood Culture results may not be optimal due to an excessive volume of blood received in culture bottles   Culture   Final    NO GROWTH 1 DAY Performed at [facility], [place] [address], [place], [place] [address]    Report Status PENDING  Incomplete  Blood culture (routine x 2)     Status: None (Preliminary result)   Collection Time: 03/17/20  5:49 AM   Specimen: BLOOD RIGHT ARM  Result Value Ref Range Status   Specimen Description BLOOD RIGHT ARM  Final   Special Requests   Final    BOTTLES DRAWN AEROBIC AND ANAEROBIC Blood Culture results may not be optimal due to an excessive volume of blood received in culture bottles   Culture   Final    NO GROWTH 1 DAY Performed at [facility], [place] [address], [place], [place] [address]    Report Status PENDING  Incomplete     Labs: BNP (last 3 results) No results for input(s): BNP in the last 8760 hours. Basic Metabolic Panel: Recent Labs  Lab 03/16/20 1841 03/18/20 0958 03/19/20 0632  NA 135 136 137  K 4.1 4.3 3.9  CL 100 104 104  CO2 24 22 22   GLUCOSE 102* 94 93  BUN 8 9 7   CREATININE 1.04 0.98 0.70  CALCIUM 9.2 9.2 9.2  MG  --  2.4  --   PHOS  --  3.2  --    Liver Function Tests: Recent Labs  Lab  03/16/20 1841 03/18/20 0958 03/19/20 0632  AST 26 24 23   ALT 49* 42 45*  ALKPHOS 126 121 125  BILITOT 1.3* 1.2 0.7  PROT 8.3* 8.2* 8.0  ALBUMIN 3.6 3.4* 3.4*   No results for input(s): LIPASE, AMYLASE in the last 168 hours. No results for input(s): AMMONIA in the last 168 hours. CBC: Recent Labs  Lab 03/16/20 1841 03/18/20 0958  WBC 14.2* 10.9*  NEUTROABS 10.7*  --   HGB 14.4 14.2  HCT 43.7 43.4  MCV 87.2 86.5  PLT 570* 481*   Cardiac Enzymes: No results for input(s): CKTOTAL, CKMB, CKMBINDEX, TROPONINI in the last 168 hours. BNP: Invalid input(s): POCBNP CBG: No  results for input(s): GLUCAP in the last 168 hours. D-Dimer No results for input(s): DDIMER in the last 72 hours. Hgb A1c No results for input(s): HGBA1C in the last 72 hours. Lipid Profile No results for input(s): CHOL, HDL, LDLCALC, TRIG, CHOLHDL, LDLDIRECT in the last 72 hours. Thyroid function studies No results for input(s): TSH, T4TOTAL, T3FREE, THYROIDAB in the last 72 hours.  Invalid input(s): FREET3 Anemia work up No results for input(s): VITAMINB12, FOLATE, FERRITIN, TIBC, IRON, RETICCTPCT in the last 72 hours. Urinalysis    Component Value Date/Time   COLORURINE AMBER (A) 03/16/2020 1940   APPEARANCEUR CLEAR 03/16/2020 1940   LABSPEC 1.024 03/16/2020 1940   PHURINE 5.0 03/16/2020 1940   GLUCOSEU NEGATIVE 03/16/2020 1940   HGBUR NEGATIVE 03/16/2020 1940   BILIRUBINUR SMALL (A) 03/16/2020 1940   KETONESUR 5 (A) 03/16/2020 1940   PROTEINUR 30 (A) 03/16/2020 1940   NITRITE NEGATIVE 03/16/2020 1940   LEUKOCYTESUR NEGATIVE 03/16/2020 1940   Sepsis Labs Invalid input(s): PROCALCITONIN,  WBC,  LACTICIDVEN Microbiology Recent Results (from the past 240 hour(s))  Respiratory Panel by RT PCR (Flu A&B, Covid) - Throat     Status: None   Collection Time: 03/17/20  4:13 AM   Specimen: Throat  Result Value Ref Range Status   SARS Coronavirus 2 by RT PCR NEGATIVE NEGATIVE Final    Comment:  (NOTE) SARS-CoV-2 target nucleic acids are NOT DETECTED. The SARS-CoV-2 RNA is generally detectable in upper respiratoy specimens during the acute phase of infection. The lowest concentration of SARS-CoV-2 viral copies this assay can detect is 131 copies/mL. A negative result does not preclude SARS-Cov-2 infection and should not be used as the sole basis for treatment or other patient management decisions. A negative result may occur with  improper specimen collection/handling, submission of specimen other than nasopharyngeal swab, presence of viral mutation(s) within the areas targeted by this assay, and inadequate number of viral copies (<131 copies/mL). A negative result must be combined with clinical observations, patient history, and epidemiological information. The expected result is Negative. Fact Sheet for Patients:  PinkCheek.be Fact Sheet for Healthcare Providers:  GravelBags.it This test is not yet ap proved or cleared by the [place] FDA and  has been authorized for detection and/or diagnosis of SARS-CoV-2 by FDA under an Emergency Use Authorization (EUA). This EUA will remain  in effect (meaning this test can be used) for the duration of the COVID-19 declaration under Section 564(b)(1) of the Act, 21 U.S.C. section 360bbb-3(b)(1), unless the authorization is terminated or revoked sooner.    Influenza A by PCR NEGATIVE NEGATIVE Final   Influenza B by PCR NEGATIVE NEGATIVE Final    Comment: (NOTE) The Xpert Xpress SARS-CoV-2/FLU/RSV assay is intended as an aid in  the diagnosis of influenza from Nasopharyngeal swab specimens and  should not be used as a sole basis for treatment. Nasal washings and  aspirates are unacceptable for Xpert Xpress SARS-CoV-2/FLU/RSV  testing. Fact Sheet for Patients: PinkCheek.be Fact Sheet for Healthcare  Providers: GravelBags.it This test is not yet approved or cleared by the [place] FDA and  has been authorized for detection and/or diagnosis of SARS-CoV-2 by  FDA under an Emergency Use Authorization (EUA). This EUA will remain  in effect (meaning this test can be used) for the duration of the  Covid-19 declaration under Section 564(b)(1) of the Act, 21  U.S.C. section 360bbb-3(b)(1), unless the authorization is  terminated or revoked. Performed at [facility], [place],  [place] [address]   Group A Strep by PCR     Status: None   Collection Time: 03/17/20  4:40 AM  Result Value Ref Range Status   Group A Strep by PCR NOT DETECTED NOT DETECTED Final    Comment: Performed at [facility], [place] [address], [place], [place] [address]  Blood culture (routine x 2)     Status: None (Preliminary result)   Collection Time: 03/17/20  5:49 AM   Specimen: BLOOD LEFT ARM  Result Value Ref Range Status   Specimen Description BLOOD LEFT ARM  Final   Special Requests   Final    BOTTLES DRAWN AEROBIC AND ANAEROBIC Blood Culture results may not be optimal due to an excessive volume of blood received in culture bottles   Culture   Final    NO GROWTH 1 DAY Performed at [facility], [place] [address] [place], [place] [address]    Report Status PENDING  Incomplete  Blood culture (routine x 2)     Status: None (Preliminary result)   Collection Time: 03/17/20  5:49 AM   Specimen: BLOOD RIGHT ARM  Result Value Ref Range Status   Specimen Description BLOOD RIGHT ARM  Final   Special Requests   Final    BOTTLES DRAWN AEROBIC AND ANAEROBIC Blood Culture results may not be optimal due to an excessive volume of blood received in culture bottles   Culture   Final    NO GROWTH 1 DAY Performed at [facility], [place] [address] [place], [place] [address]    Report Status PENDING  Incomplete     Time coordinating discharge:  35  minutes  SIGNED:   Barb Merino, MD  [facility] 03/19/2020, 11:21 AM

## 2020-03-19 NOTE — Progress Notes (Signed)
    Subjective  -   Walked to [employer] yesterday and did pretty well.  He did have some discomfort and heaviness when he returned.  He feels like his leg is getting better.   Physical Exam:  TED hose to left leg. Foot is warm and well-perfused. Nonlabored respirations    Assessment/Plan:    Left leg DVT: We discussed giving him a trial of treatment with anticoagulation and compression.  I am going to order 20-30 thigh-high compression stockings for him to go home with.  He will be discharged home on Xarelto.  I will schedule follow-up with Dr. Carlis Abbott on Friday.  I discussed with the patient that if he is not getting better we could consider percutaneous attempts at thrombectomy however hopefully he will continue to improve and not require any intervention.  Etiology for clot is unknown, therefore he will need a hypercoagulable work-up and referral to hematology oncology as an outpatient.  [name] 03/19/2020 8:20 AM --  Vitals:   03/18/20 1652 03/18/20 2202  BP: (!) 140/105 (!) 133/100  Pulse: 79 78  Resp: 20 18  Temp: 97.9 F (36.6 C) 97.8 F (36.6 C)  SpO2: 97% 98%    Intake/Output Summary (Last 24 hours) at 03/19/2020 0820 Last data filed at 03/18/2020 1700 Gross per 24 hour  Intake 177.68 ml  Output 1950 ml  Net -1772.32 ml     Laboratory CBC    Component Value Date/Time   WBC 10.9 (H) 03/18/2020 0958   HGB 14.2 03/18/2020 0958   HCT 43.4 03/18/2020 0958   PLT 481 (H) 03/18/2020 0958    BMET    Component Value Date/Time   NA 137 03/19/2020 0632   K 3.9 03/19/2020 0632   CL 104 03/19/2020 0632   CO2 22 03/19/2020 0632   GLUCOSE 93 03/19/2020 0632   BUN 7 03/19/2020 0632   CREATININE 0.70 03/19/2020 0632   CALCIUM 9.2 03/19/2020 0632   GFRNONAA >60 03/19/2020 0632   GFRAA >60 03/19/2020 [id number]    COAG Lab Results  Component Value Date   INR 1.4 (H) 03/17/2020   INR 1.4 (H) 03/16/2020   No results found for: PTT  Antibiotics Anti-infectives  (From admission, onward)   Start     Dose/Rate Route Frequency Ordered Stop   03/17/20 1800  vancomycin (VANCOREADY) IVPB 1250 mg/250 mL  Status:  Discontinued     1,250 mg 166.7 mL/hr over 90 Minutes Intravenous Every 8 hours 03/17/20 0708 03/18/20 0808   03/17/20 0800  vancomycin (VANCOREADY) IVPB 2000 mg/400 mL     2,000 mg 200 mL/hr over 120 Minutes Intravenous  Once 03/17/20 0708 03/17/20 1312   03/17/20 0800  ceFEPIme (MAXIPIME) 2 g in sodium chloride 0.9 % 100 mL IVPB  Status:  Discontinued     2 g 200 mL/hr over 30 Minutes Intravenous Every 8 hours 03/17/20 0708 03/18/20 0808   03/17/20 0345  cefTRIAXone (ROCEPHIN) 1 g in sodium chloride 0.9 % 100 mL IVPB     1 g 200 mL/hr over 30 Minutes Intravenous  Once 03/17/20 0336 03/17/20 0434       V. Leia Alf, M.D., [facility] [place] Office: [phone] Pager:  [phone]

## 2020-03-21 ENCOUNTER — Telehealth: Payer: Self-pay | Admitting: Hematology and Oncology

## 2020-03-21 NOTE — Telephone Encounter (Signed)
Received a new hem referral from the ED for Acute deep vein thrombosis (DVT) of proximal vein of left lower extremity. Mr. Noah Moore has been cld and scheduled to see Dr. Lorenso Courier on 5/17 at 9am. Pt aware to arrive 15 minutes early.

## 2020-03-22 LAB — CULTURE, BLOOD (ROUTINE X 2)
Culture: NO GROWTH
Culture: NO GROWTH

## 2020-03-22 LAB — FACTOR 5 LEIDEN

## 2020-03-22 LAB — PROTHROMBIN GENE MUTATION

## 2020-03-26 HISTORY — PX: THROMBECTOMY: PRO61

## 2020-03-29 ENCOUNTER — Ambulatory Visit: Payer: 59 | Admitting: Vascular Surgery

## 2020-03-29 ENCOUNTER — Other Ambulatory Visit: Payer: Self-pay

## 2020-03-29 ENCOUNTER — Other Ambulatory Visit (HOSPITAL_COMMUNITY)
Admission: RE | Admit: 2020-03-29 | Discharge: 2020-03-29 | Disposition: A | Discharge status: Home / Self Care | Payer: 59 | Source: Ambulatory Visit | Attending: Vascular Surgery | Admitting: Vascular Surgery

## 2020-03-29 ENCOUNTER — Encounter: Payer: Self-pay | Admitting: Vascular Surgery

## 2020-03-29 VITALS — BP 121/79 | HR 100 | Temp 97.6°F | Resp 20 | Ht 76.0 in | Wt 293.0 lb

## 2020-03-29 DIAGNOSIS — I824Z2 Acute embolism and thrombosis of unspecified deep veins of left distal lower extremity: Secondary | ICD-10-CM | POA: Diagnosis not present

## 2020-03-29 DIAGNOSIS — Z01812 Encounter for preprocedural laboratory examination: Secondary | ICD-10-CM | POA: Insufficient documentation

## 2020-03-29 DIAGNOSIS — Z20822 Contact with and (suspected) exposure to covid-19: Secondary | ICD-10-CM | POA: Diagnosis not present

## 2020-03-29 LAB — SARS CORONAVIRUS 2 (TAT 6-24 HRS): SARS Coronavirus 2: NEGATIVE

## 2020-03-29 NOTE — Progress Notes (Signed)
Patient name: Noah Moore MRN: [id number] DOB: 1991/09/02 Sex: male  REASON FOR VISIT: Follow-up for left leg DVT  HPI: Noah Moore is a 29 y.o. male with history of extensive left leg DVT that presents for hospital follow-up.  Patient was initially seen in consultation on 03/17/2020 in the ED with a left lower extremity DVT and about 3 weeks of symptoms.  His external iliac vein was patent and ultimately he improved with heparin and was discharged on Eliquis.  He comes in today for hospital follow-up.  He does not feel his left leg is doing well.  States he is having much more pain and having a difficult time walking on his leg.  He recently got a repeat duplex at [facility] that showed DVT throughout the left superficial femoral vein and popliteal vein but patent common femoral vein.  Past Medical History:  Diagnosis Date  . Allergy   . DVT (deep venous thrombosis) ([place])     Past Surgical History:  Procedure Laterality Date  . KNEE SURGERY Right   . LEG SURGERY     Reports history of right lower extremity tibia fracture in 2010 which was repaired surgically.    History reviewed. No pertinent family history.  SOCIAL HISTORY: Social History   Tobacco Use  . Smoking status: Current Some Day Smoker    Types: E-cigarettes  . Smokeless tobacco: Never Used  Substance Use Topics  . Alcohol use: Not Currently    No Known Allergies  Current Outpatient Medications  Medication Sig Dispense Refill  . APIXABAN (ELIQUIS) VTE STARTER PACK (10MG  AND 5MG ) Take as directed on package: start with two-5mg  tablets twice daily for 7 days. On day 8, switch to one-5mg  tablet twice daily. (Patient taking differently: Take 5 mg by mouth 2 (two) times daily. Take as directed on package: start with two-5mg  tablets twice daily for 7 days. On day 8, switch to one-5mg  tablet twice daily.) 1 each 2  . cetirizine (ZYRTEC) 10 MG tablet Take 1 tablet by mouth daily.    Marland Kitchen  HYDROcodone-acetaminophen (NORCO/VICODIN) 5-325 MG tablet Take 1 tablet by mouth every 4 (four) hours as needed.     No current facility-administered medications for this visit.    REVIEW OF SYSTEMS:  [X]  denotes positive finding, [ ]  denotes negative finding Cardiac  Comments:  Chest pain or chest pressure:    Shortness of breath upon exertion:    Short of breath when lying flat:    Irregular heart rhythm:        Vascular    Pain in calf, thigh, or hip brought on by ambulation:    Pain in feet at night that wakes you up from your sleep:     Blood clot in your veins:    Leg swelling:  x Left leg      Pulmonary    Oxygen at home:    Productive cough:     Wheezing:         Neurologic    Sudden weakness in arms or legs:     Sudden numbness in arms or legs:     Sudden onset of difficulty speaking or slurred speech:    Temporary loss of vision in one eye:     Problems with dizziness:         Gastrointestinal    Blood in stool:     Vomited blood:         Genitourinary    Burning when urinating:  Blood in urine:        Psychiatric    Major depression:         Hematologic    Bleeding problems:    Problems with blood clotting too easily:        Skin    Rashes or ulcers:        Constitutional    Fever or chills:      PHYSICAL EXAM: Vitals:   03/29/20 1057  BP: 121/79  Pulse: 100  Resp: 20  Temp: 97.6 F (36.4 C)  SpO2: 96%  Weight: 293 lb (132.9 kg)  Height: 6\' 4"  (1.93 m)    GENERAL: The patient is a well-nourished male, in no acute distress. The vital signs are documented above. CARDIAC: There is a regular rate and rhythm.  VASCULAR:  Palpable left PT Left leg in compression, no signs of phlegmasia PULMONARY: There is good air exchange bilaterally without wheezing or rales. ABDOMEN: Soft and non-tender with normal pitched bowel sounds.  MUSCULOSKELETAL: There are no major deformities or cyanosis. NEUROLOGIC: No focal weakness or paresthesias are  detected. SKIN: There are no ulcers or rashes noted. PSYCHIATRIC: The patient has a normal affect.  DATA:   None today  Assessment/Plan:  29 year old male that was seen in the hospital on 03/17/2020 with extensive left leg DVT.  At the time he was managed with conservative measures and got better on heparin and was discharged on Eliquis.  His duplex in the hospital suggested the external iliac vein was patent with common femoral vein occluded.  Unfortunately does not seem to be doing as well and having more pain etc.  We discussed continued conservative management versus percutaneous mechanical thrombectomy and IVUS to see if he has more extensive clot than the ultrasound would suggest.  Ultimately we decided to proceed and I did discuss risks and benefits in detail with him.   Marty Heck, MD [facility] [place] Office: [phone]

## 2020-03-30 ENCOUNTER — Other Ambulatory Visit: Payer: Self-pay

## 2020-03-30 ENCOUNTER — Observation Stay (HOSPITAL_COMMUNITY)
Admission: RE | Admit: 2020-03-30 | Discharge: 2020-03-31 | Disposition: A | Discharge status: Home / Self Care | Payer: 59 | Attending: Vascular Surgery | Admitting: Vascular Surgery

## 2020-03-30 ENCOUNTER — Encounter (HOSPITAL_COMMUNITY)
Admission: RE | Disposition: A | Discharge status: Home / Self Care | Payer: Self-pay | Source: Home / Self Care | Attending: Vascular Surgery

## 2020-03-30 DIAGNOSIS — I82402 Acute embolism and thrombosis of unspecified deep veins of left lower extremity: Secondary | ICD-10-CM | POA: Diagnosis present

## 2020-03-30 DIAGNOSIS — I82409 Acute embolism and thrombosis of unspecified deep veins of unspecified lower extremity: Principal | ICD-10-CM | POA: Insufficient documentation

## 2020-03-30 DIAGNOSIS — F17290 Nicotine dependence, other tobacco product, uncomplicated: Secondary | ICD-10-CM | POA: Insufficient documentation

## 2020-03-30 DIAGNOSIS — Z7901 Long term (current) use of anticoagulants: Secondary | ICD-10-CM | POA: Diagnosis not present

## 2020-03-30 HISTORY — PX: INTRAVASCULAR ULTRASOUND/IVUS: CATH118244

## 2020-03-30 HISTORY — PX: IVC VENOGRAPHY: CATH118301

## 2020-03-30 HISTORY — PX: LOWER EXTREMITY VENOGRAPHY: CATH118253

## 2020-03-30 HISTORY — PX: PERIPHERAL VASCULAR THROMBECTOMY: CATH118306

## 2020-03-30 LAB — POCT I-STAT, CHEM 8
BUN: 5 mg/dL — ABNORMAL LOW (ref 6–20)
Calcium, Ion: 1.17 mmol/L (ref 1.15–1.40)
Chloride: 104 mmol/L (ref 98–111)
Creatinine, Ser: 0.7 mg/dL (ref 0.61–1.24)
Glucose, Bld: 87 mg/dL (ref 70–99)
HCT: 38 % — ABNORMAL LOW (ref 39.0–52.0)
Hemoglobin: 12.9 g/dL — ABNORMAL LOW (ref 13.0–17.0)
Potassium: 4.1 mmol/L (ref 3.5–5.1)
Sodium: 140 mmol/L (ref 135–145)
TCO2: 28 mmol/L (ref 22–32)

## 2020-03-30 SURGERY — PERIPHERAL VASCULAR THROMBECTOMY
Anesthesia: LOCAL

## 2020-03-30 MED ORDER — SODIUM CHLORIDE 0.9 % IV SOLN: 250.0000 mL | INTRAVENOUS | Status: DC | PRN

## 2020-03-30 MED ORDER — IODIXANOL 320 MG/ML IV SOLN
INTRAVENOUS | Status: DC | PRN
  Administered 2020-03-30: 55 mL

## 2020-03-30 MED ORDER — SODIUM CHLORIDE 0.9 % IV SOLN: INTRAVENOUS | Status: AC

## 2020-03-30 MED ORDER — HEPARIN SODIUM (PORCINE) 1000 UNIT/ML IJ SOLN
INTRAMUSCULAR | Status: AC
  Filled 2020-03-30: qty 1

## 2020-03-30 MED ORDER — SODIUM CHLORIDE 0.9% FLUSH
3.0000 mL | Freq: Two times a day (BID) | INTRAVENOUS | Status: DC
  Administered 2020-03-30 – 2020-03-31 (×2): 3 mL via INTRAVENOUS

## 2020-03-30 MED ORDER — ASPIRIN EC 81 MG PO TBEC
81.0000 mg | DELAYED_RELEASE_TABLET | Freq: Every day | ORAL | Status: DC
  Administered 2020-03-31: 81 mg via ORAL
  Filled 2020-03-30: qty 1

## 2020-03-30 MED ORDER — SODIUM CHLORIDE 0.9 % IV SOLN: INTRAVENOUS | Status: DC

## 2020-03-30 MED ORDER — HEPARIN (PORCINE) IN NACL 1000-0.9 UT/500ML-% IV SOLN
INTRAVENOUS | Status: DC | PRN
  Administered 2020-03-30: 500 mL

## 2020-03-30 MED ORDER — ACETAMINOPHEN 325 MG PO TABS: 650.0000 mg | ORAL_TABLET | ORAL | Status: DC | PRN

## 2020-03-30 MED ORDER — HEPARIN SODIUM (PORCINE) 1000 UNIT/ML IJ SOLN
INTRAMUSCULAR | Status: DC | PRN
  Administered 2020-03-30: 5000 [IU] via INTRAVENOUS
  Administered 2020-03-30: 3000 [IU] via INTRAVENOUS

## 2020-03-30 MED ORDER — MIDAZOLAM HCL 2 MG/2ML IJ SOLN
INTRAMUSCULAR | Status: AC
  Filled 2020-03-30: qty 2

## 2020-03-30 MED ORDER — HEPARIN (PORCINE) IN NACL 1000-0.9 UT/500ML-% IV SOLN
INTRAVENOUS | Status: AC
  Filled 2020-03-30: qty 500

## 2020-03-30 MED ORDER — HYDRALAZINE HCL 20 MG/ML IJ SOLN: 5.0000 mg | INTRAMUSCULAR | Status: DC | PRN

## 2020-03-30 MED ORDER — LABETALOL HCL 5 MG/ML IV SOLN: 10.0000 mg | INTRAVENOUS | Status: DC | PRN

## 2020-03-30 MED ORDER — HYDROCODONE-ACETAMINOPHEN 5-325 MG PO TABS
1.0000 | ORAL_TABLET | ORAL | Status: DC | PRN
  Administered 2020-03-30: 2 via ORAL
  Administered 2020-03-31: 1 via ORAL
  Filled 2020-03-30: qty 1
  Filled 2020-03-30: qty 2

## 2020-03-30 MED ORDER — ONDANSETRON HCL 4 MG/2ML IJ SOLN: 4.0000 mg | Freq: Four times a day (QID) | INTRAMUSCULAR | Status: DC | PRN

## 2020-03-30 MED ORDER — LIDOCAINE HCL (PF) 1 % IJ SOLN
INTRAMUSCULAR | Status: DC | PRN
  Administered 2020-03-30: 30 mL via INTRADERMAL

## 2020-03-30 MED ORDER — MIDAZOLAM HCL 2 MG/2ML IJ SOLN
INTRAMUSCULAR | Status: DC | PRN
  Administered 2020-03-30 (×3): 1 mg via INTRAVENOUS

## 2020-03-30 MED ORDER — FENTANYL CITRATE (PF) 100 MCG/2ML IJ SOLN
INTRAMUSCULAR | Status: AC
  Filled 2020-03-30: qty 2

## 2020-03-30 MED ORDER — LIDOCAINE HCL (PF) 1 % IJ SOLN
INTRAMUSCULAR | Status: AC
  Filled 2020-03-30: qty 30

## 2020-03-30 MED ORDER — APIXABAN 5 MG PO TABS
5.0000 mg | ORAL_TABLET | Freq: Two times a day (BID) | ORAL | Status: DC
  Administered 2020-03-30 – 2020-03-31 (×2): 5 mg via ORAL
  Filled 2020-03-30 (×2): qty 1

## 2020-03-30 MED ORDER — FENTANYL CITRATE (PF) 100 MCG/2ML IJ SOLN
INTRAMUSCULAR | Status: DC | PRN
  Administered 2020-03-30: 25 ug via INTRAVENOUS
  Administered 2020-03-30 (×2): 50 ug via INTRAVENOUS

## 2020-03-30 MED ORDER — SODIUM CHLORIDE 0.9% FLUSH: 3.0000 mL | INTRAVENOUS | Status: DC | PRN

## 2020-03-30 SURGICAL SUPPLY — 20 items
BAG SNAP BAND KOVER 36X36 (MISCELLANEOUS) ×1 IMPLANT
BALLN MUSTANG 7X200X135 (BALLOONS) ×3
BALLN MUSTANG 8X60X135 (BALLOONS) ×3
BALLOON MUSTANG 7X200X135 (BALLOONS) IMPLANT
BALLOON MUSTANG 8X60X135 (BALLOONS) IMPLANT
CATH BEACON 5 .035 65 KMP TIP (CATHETERS) ×1 IMPLANT
CATH RETRIEVER CLOT 16MMX105CM (CATHETERS) ×1 IMPLANT
CATH VISIONS PV .035 IVUS (CATHETERS) ×1 IMPLANT
COVER DOME SNAP 22 D (MISCELLANEOUS) ×1 IMPLANT
GLIDEWIRE ADV .035X260CM (WIRE) ×1 IMPLANT
KIT ENCORE 26 ADVANTAGE (KITS) ×1 IMPLANT
KIT MICROPUNCTURE NIT STIFF (SHEATH) ×1 IMPLANT
PROTECTION STATION PRESSURIZED (MISCELLANEOUS) ×3
SHEATH CLOT RETRIEVER (SHEATH) ×1 IMPLANT
SHEATH PINNACLE 5F 10CM (SHEATH) ×1 IMPLANT
SHEATH PINNACLE 8F 10CM (SHEATH) ×1 IMPLANT
SHEATH PROBE COVER 6X72 (BAG) ×1 IMPLANT
STATION PROTECTION PRESSURIZED (MISCELLANEOUS) IMPLANT
TRAY PV CATH (CUSTOM PROCEDURE TRAY) ×1 IMPLANT
WIRE TORQFLEX AUST .018X40CM (WIRE) ×1 IMPLANT

## 2020-03-30 NOTE — Op Note (Signed)
Patient name: Noah Moore MRN: [id number] DOB: 1991/01/13 Sex: male  03/30/2020 Pre-operative Diagnosis: Extensive left leg DVT Post-operative diagnosis:  Same Surgeon:  Marty Heck, MD Procedure Performed: 1.  Ultrasound-guided access of left popliteal vein 2.  Catheter selection of inferior vena cava with left leg venogram 3.  Intravascular ultrasound (IVUS) of left popliteal vein, superficial femoral vein, common femoral vein, external iliac vein, common iliac vein, inferior vena cava 4.  Percutaneous mechanical thrombectomy of left popliteal and superficial femoral vein (Innari ClotTriever) 5.  Left popliteal and superficial femoral vein venoplasty (7 mm and 8 mm Mustang) 6.  76 minutes of monitored moderate conscious sedation  Indications: Patient is a 29 year old male who was seen several weeks ago in consultation with extensive left leg DVT.  Ultimately there was evidence of thrombus in the common femoral vein, superficial femoral vein and popliteal vein.  He ultimately improved with conservative measures and was discharged home on Eliquis.  On follow-up he was having worsening leg symptoms and felt his leg was not tolerable.  He presents today for planned percutaneous thrombectomy and IVUS of his extensive left leg DVT after risks benefits discussed.  Findings:   Ultrasound-guided access of the left popliteal vein was very difficult given significant edema as well as obesity.  Ultimately after ultrasound-guided access was obtained IVUS confirmed more subacute to chronic appearing thrombus in the left popliteal and superficial femoral vein segment.  The left common femoral vein, external iliac vein, common iliac vein and IVC were all patent and free of thrombus.  Subsequently performed percutaenous mechanical thrombectomy and mostly chronic appearing thrombus was retrieved from the left superficial femoral vein and popliteal vein segment.  Subsequently performed balloon  angioplasty of the entire superficial femoral vein and popliteal vein segment to get a flow channel with 7 mm 8 mm Mustang.  He now has good flow channel in the left popliteal and superficial femoral vein as noted on venogram.   Procedure:  The patient was identified in the holding area and taken to room 8.  The patient was placed prone on the table.  His left knee was then prepped and draped in usual sterile fashion on the posterior surface.  Initially used ultrasound to evaluate the left popliteal vein, it was not patent, there was acute appearing thrombus.  It should be noted that due to significant edema in the leg was very difficult to visualize the vein versus the artery.  Ultimately I was able to finally get access in the popliteal vein with a micro access needle and advanced a micro access wire and placed a micro sheath.  I did a brief hand-injection confirmed indeed I was in the popliteal vein which I was.  I then used a Glidewire advantage and had some resistance crossing the thrombus in the popliteal and superficial femoral vein segments suggesting more chronic etiology.  I then placed a short 5 French sheath in with a KMP catheter finally got across all the occluded segment into the common femoral vein and iliac vein where the wire passed much easier.  We then performed IVUS of the left popliteal, superficial femoral vein, common femoral vein, external iliac common iliac vein and IVC with pertinent findings noted above.  He was given 5000 units of IV heparin and then additional 3000 units of IV heparin.  I then elected to perform percutaneous mechanical thrombectomy with Inari ClotTriever.  The 28 French sheath for the device was placed in the popliteal vein.  There  were 3 passes that were made initially with the basket deployed in IVC and this was brought down through the iliac and femoral vein and popliteal segment and collapsed.  Most of the thrombus that was retrieved was more chronic appearing.  I  performed IVUS again and there was still more chronic appearing thrombus throughout the popliteal and superficial femoral vein segment.  I then elected to perform balloon angioplasty to get a flow channel with 7 mm and 8 mm Mustang to nominal pressure for 2 minutes about the entire segment in the popliteal and superficial femoral vein segment.  I then performed hand-injection venogram that showed a much better flow channel but there was one area where there appeared to be some limiting residual thrombus.  I made one additional pass with ClotTriever for four passes total and got more chronic appearing thrombus.  Final venogram showed flow through the popliteal and superficial vein femoral vein segment and that obviously a already patent iliac vein segment.  Happy with the results wires and catheters were removed.  The 13 French sheath was removed and a 4-0 pursestring was tied down and manual pressure was held.  Taken to recovery in stable condition.    Marty Heck, MD [facility] [place] Office: [place]

## 2020-03-30 NOTE — H&P (Signed)
History and Physical Interval Note:  03/30/2020 2:43 PM  Noah Moore  has presented today for surgery, with the diagnosis of Acute DVT.  The various methods of treatment have been discussed with the patient and family. After consideration of risks, benefits and other options for treatment, the patient has consented to  Procedure(s): PERIPHERAL VASCULAR THROMBECTOMY (Left) INTRAVASCULAR ULTRASOUND/IVUS (N/A) as a surgical intervention.  The patient's history has been reviewed, patient examined, no change in status, stable for surgery.  I have reviewed the patient's chart and labs.  Questions were answered to the patient's satisfaction.     Marty Heck  Patient name: Noah Moore MRN: [id number] DOB: 08-Apr-1991 Sex: male  REASON FOR VISIT: Follow-up for left leg DVT  HPI:  Noah Moore is a 29 y.o. male with history of extensive left leg DVT that presents for hospital follow-up. Patient was initially seen in consultation on 03/17/2020 in the ED with a left lower extremity DVT and about 3 weeks of symptoms. His external iliac vein was patent and ultimately he improved with heparin and was discharged on Eliquis. He comes in today for hospital follow-up. He does not feel his left leg is doing well. States he is having much more pain and having a difficult time walking on his leg. He recently got a repeat duplex at [facility] regional that showed DVT throughout the left superficial femoral vein and popliteal vein but patent common femoral vein.      Past Medical History:  Diagnosis Date  . Allergy   . DVT (deep venous thrombosis) ([place])         Past Surgical History:  Procedure Laterality Date  . KNEE SURGERY Right   . LEG SURGERY     Reports history of right lower extremity tibia fracture in 2010 which was repaired surgically.   History reviewed. No pertinent family history.  SOCIAL HISTORY:  Social History        Tobacco Use  . Smoking status: Current Some Day Smoker     Types: E-cigarettes  . Smokeless tobacco: Never Used  Substance Use Topics  . Alcohol use: Not Currently   No Known Allergies        Current Outpatient Medications  Medication Sig Dispense Refill  . APIXABAN (ELIQUIS) VTE STARTER PACK (10MG  AND 5MG ) Take as directed on package: start with two-5mg  tablets twice daily for 7 days. On day 8, switch to one-5mg  tablet twice daily. (Patient taking differently: Take 5 mg by mouth 2 (two) times daily. Take as directed on package: start with two-5mg  tablets twice daily for 7 days. On day 8, switch to one-5mg  tablet twice daily.) 1 each 2  . cetirizine (ZYRTEC) 10 MG tablet Take 1 tablet by mouth daily.    Marland Kitchen HYDROcodone-acetaminophen (NORCO/VICODIN) 5-325 MG tablet Take 1 tablet by mouth every 4 (four) hours as needed.     No current facility-administered medications for this visit.   REVIEW OF SYSTEMS:  [X]  denotes positive finding, [ ]  denotes negative finding  Cardiac  Comments:  Chest pain or chest pressure:    Shortness of breath upon exertion:    Short of breath when lying flat:    Irregular heart rhythm:        Vascular    Pain in calf, thigh, or hip brought on by ambulation:    Pain in feet at night that wakes you up from your sleep:     Blood clot in your veins:    Leg swelling:  x Left leg      Pulmonary    Oxygen at home:    Productive cough:     Wheezing:         Neurologic    Sudden weakness in arms or legs:     Sudden numbness in arms or legs:     Sudden onset of difficulty speaking or slurred speech:    Temporary loss of vision in one eye:     Problems with dizziness:         Gastrointestinal    Blood in stool:     Vomited blood:         Genitourinary    Burning when urinating:     Blood in urine:        Psychiatric    Major depression:         Hematologic    Bleeding problems:    Problems with blood clotting too easily:        Skin    Rashes or ulcers:        Constitutional    Fever or chills:     PHYSICAL EXAM:     Vitals:   03/29/20 1057  BP: 121/79  Pulse: 100  Resp: 20  Temp: 97.6 F (36.4 C)  SpO2: 96%  Weight: 293 lb (132.9 kg)  Height: 6\' 4"  (1.93 m)   GENERAL: The patient is a well-nourished male, in no acute distress. The vital signs are documented above.  CARDIAC: There is a regular rate and rhythm.  VASCULAR:  Palpable left PT  Left leg in compression, no signs of phlegmasia  PULMONARY: There is good air exchange bilaterally without wheezing or rales.  ABDOMEN: Soft and non-tender with normal pitched bowel sounds.  MUSCULOSKELETAL: There are no major deformities or cyanosis.  NEUROLOGIC: No focal weakness or paresthesias are detected.  SKIN: There are no ulcers or rashes noted.  PSYCHIATRIC: The patient has a normal affect.  DATA:  None today  Assessment/Plan:  29 year old male that was seen in the hospital on 03/17/2020 with extensive left leg DVT. At the time he was managed with conservative measures and got better on heparin and was discharged on Eliquis. His duplex in the hospital suggested the external iliac vein was patent with common femoral vein occluded. Unfortunately does not seem to be doing as well and having more pain etc. We discussed continued conservative management versus percutaneous mechanical thrombectomy and IVUS to see if he has more extensive clot than the ultrasound would suggest. Ultimately we decided to proceed and I did discuss risks and benefits in detail with him.  Marty Heck, MD  [facility] [place]  Office: [phone]

## 2020-03-30 NOTE — Progress Notes (Signed)
Pt received from cath lab to 4E20. CHG wipes applied. Oriented to room and call bell. Tele box connected, CCMD called. VSS. Call bell in reach. Will continue to monitor.  Arletta Bale, RN

## 2020-03-31 DIAGNOSIS — I82409 Acute embolism and thrombosis of unspecified deep veins of unspecified lower extremity: Secondary | ICD-10-CM | POA: Diagnosis not present

## 2020-03-31 MED ORDER — ASPIRIN 81 MG PO TBEC: 81.0000 mg | DELAYED_RELEASE_TABLET | Freq: Every day | ORAL | 11 refills | Status: DC

## 2020-03-31 MED ORDER — APIXABAN 5 MG PO TABS
5.0000 mg | ORAL_TABLET | Freq: Two times a day (BID) | ORAL | 11 refills | Status: DC
Start: 2020-03-31 — End: 2020-06-15

## 2020-03-31 MED ORDER — HYDROCODONE-ACETAMINOPHEN 5-325 MG PO TABS: 2.0000 | ORAL_TABLET | ORAL | 0 refills | Status: DC | PRN

## 2020-03-31 NOTE — Progress Notes (Signed)
    S/P left LE surgical intervention and hospital admission 03/30/2020.  He may return to work on 04/11/2020.   F/U with Dr. Carlis Abbott in 1 month     Roxy Horseman PA-C  [facility] [phone]

## 2020-03-31 NOTE — Discharge Instructions (Signed)
° °  [facility] [place] ° °Discharge Instructions ° °Lower Extremity Angiogram; Angioplasty/Stenting ° °Please refer to the following instructions for your post-procedure care. Your surgeon or physician assistant will discuss any changes with you. ° °Activity ° °Avoid lifting more than 8 pounds (1 gallons of milk) for 72 hours (3 days) after your procedure. You may walk as much as you can tolerate. It's OK to drive after 72 hours. ° °Bathing/Showering ° °You may shower the day after your procedure. If you have a bandage, you may remove it at 24- 48 hours. Clean your incision site with mild soap and water. Pat the area dry with a clean towel. ° °Diet ° °Resume your pre-procedure diet. There are no special food restrictions following this procedure. All patients with peripheral vascular disease should follow a low fat/low cholesterol diet. In order to heal from your surgery, it is CRITICAL to get adequate nutrition. Your body requires vitamins, minerals, and protein. Vegetables are the best source of vitamins and minerals. Vegetables also provide the perfect balance of protein. Processed food has little nutritional value, so try to avoid this. ° °Medications ° °Resume taking all of your medications unless your doctor tells you not to. If your incision is causing pain, you may take over-the-counter pain relievers such as acetaminophen (Tylenol) ° °Follow Up ° °Follow up will be arranged at the time of your procedure. You may have an office visit scheduled or may be scheduled for surgery. Ask your surgeon if you have any questions. ° °Please call us immediately for any of the following conditions: °•Severe or worsening pain your legs or feet at rest or with walking. °•Increased pain, redness, drainage at your groin puncture site. °•Fever of 101 degrees or higher. °•If you have any mild or slow bleeding from your puncture site: lie down, apply firm constant pressure over the area with a piece of  gauze or a clean wash cloth for 30 minutes- no peeking!, call 911 right away if you are still bleeding after 30 minutes, or if the bleeding is heavy and unmanageable. ° °Reduce your risk factors of vascular disease: ° °Stop smoking. If you would like help call [facility] at 1-800-QUIT-NOW (1-800-784-8669) or [place] at 336-586-4000. °Manage your cholesterol °Maintain a desired weight °Control your diabetes °Keep your blood pressure down ° °If you have any questions, please call the office at [phone] ° °

## 2020-03-31 NOTE — Progress Notes (Signed)
[facility] [place]  Subjective  - has done well overnight.   Objective (!) 115/93 91 98.9 F (37.2 C) (Oral) 15 98%  Intake/Output Summary (Last 24 hours) at 03/31/2020 0748 Last data filed at 03/30/2020 2100 Gross per 24 hour  Intake 795 ml  Output --  Net 795 ml    Left popliteal access site soft behind left knee Left PT palpable  Laboratory Lab Results: Recent Labs    03/30/20 1310  HGB 12.9*  HCT 38.0*   BMET Recent Labs    03/30/20 1310  NA 140  K 4.1  CL 104  GLUCOSE 87  BUN 5*  CREATININE 0.70    COAG Lab Results  Component Value Date   INR 1.4 (H) 03/17/2020   INR 1.4 (H) 03/16/2020   No results found for: PTT  Assessment/Planning: POD #1 status post left percutaneous mechanical venous thrombectomy with venoplasty.  Doing well this morning.  No access site complications.  We will continue Eliquis at discharge.  Needs thigh-high compression of 20 to 30 mmHg.  Will arrange follow-up in 1 month.  He has outpatient referral to heme-onc for hypercoagulable work-up.  Marty Heck 03/31/2020 7:48 AM --

## 2020-04-05 ENCOUNTER — Telehealth: Payer: Self-pay | Admitting: Vascular Surgery

## 2020-04-05 NOTE — Telephone Encounter (Signed)
Dr. Dayna Ramus with [facility] physicians called regarding the patient.  Apparently he had gone on a car ride and was noted some additional swelling in his left leg.  I reviewed the chart.  He underwent venous mechanical thrombectomy with Dr. Carlis Abbott on 03/30/2020.  He is on anticoagulation.  We will contact the patient and get him in for duplex tomorrow.  Explained to Dr. Dayna Ramus that since he is on anticoagulation that probably would not recommend repeat mechanical thrombectomy if he had Toy Samarin rethrombosis and would simply treat him with anticoagulant.  We will make further recommendation pending duplex

## 2020-04-05 NOTE — Discharge Summary (Signed)
[facility] Discharge Summary   Patient ID:  Nebiyu Gerhart MRN: [id number] DOB/AGE: 1991-11-02 29 y.o.  Admit date: 03/30/2020 Discharge date: 03/31/20 Date of Surgery: 03/30/2020 Surgeon: Surgeon(s): Marty Heck, MD  Admission Diagnosis: DVT (deep venous thrombosis) ([place]) [I82.409]  Discharge Diagnoses:  DVT (deep venous thrombosis) ([place]) [I82.409]  Secondary Diagnoses: Past Medical History:  Diagnosis Date  . Allergy   . DVT (deep venous thrombosis) ([facility])     Procedure(s): PERIPHERAL VASCULAR THROMBECTOMY INTRAVASCULAR ULTRASOUND/IVUS IVC Venography LOWER EXTREMITY VENOGRAPHY  Discharged Condition: good  HPI: Lenyx Shamberger is a 29 y.o. male with history of extensive left leg DVT that presents for hospital follow-up. Patient was initially seen in consultation on 03/17/2020 in the ED with a left lower extremity DVT and about 3 weeks of symptoms. His external iliac vein was patent and ultimately he improved with heparin and was discharged on Eliquis.   Hospital Course:  Lochlann Raw is a 29 y.o. male is S/P Left Procedure(s): PERIPHERAL VASCULAR THROMBECTOMY INTRAVASCULAR ULTRASOUND/IVUS IVC Venography LOWER EXTREMITY VENOGRAPHY  Decreased edema with well healing access site and no hematoma.  Thigh high compression 20-30 mm HG on left LE.  Palpable pulses B LE. Disposition stable for discharge home.  He has outpatient referral to heme-onc for hypercoagulable work-up.   Significant Diagnostic Studies: CBC Lab Results  Component Value Date   WBC 10.9 (H) 03/18/2020   HGB 12.9 (L) 03/30/2020   HCT 38.0 (L) 03/30/2020   MCV 86.5 03/18/2020   PLT 481 (H) 03/18/2020    BMET    Component Value Date/Time   NA 140 03/30/2020 1310   K 4.1 03/30/2020 1310   CL 104 03/30/2020 1310   CO2 22 03/19/2020 0632   GLUCOSE 87 03/30/2020 1310   BUN 5 (L) 03/30/2020 1310   CREATININE 0.70 03/30/2020 1310   CALCIUM 9.2 03/19/2020 0632    GFRNONAA >60 03/19/2020 0632   GFRAA >60 03/19/2020 [id number]   COAG Lab Results  Component Value Date   INR 1.4 (H) 03/17/2020   INR 1.4 (H) 03/16/2020     Disposition:  Discharge to :Home Discharge Instructions    Call MD for:  redness, tenderness, or signs of infection (pain, swelling, bleeding, redness, odor or green/yellow discharge around incision site)   Complete by: As directed    Call MD for:  redness, tenderness, or signs of infection (pain, swelling, bleeding, redness, odor or green/yellow discharge around incision site)   Complete by: As directed    Call MD for:  severe or increased pain, loss or decreased feeling  in affected limb(s)   Complete by: As directed    Call MD for:  severe or increased pain, loss or decreased feeling  in affected limb(s)   Complete by: As directed    Call MD for:  temperature >100.5   Complete by: As directed    Call MD for:  temperature >100.5   Complete by: As directed    Discharge instructions   Complete by: As directed    Wear compression daily and may remove at night   Resume previous diet   Complete by: As directed    Resume previous diet   Complete by: As directed      Allergies as of 03/31/2020   No Known Allergies     Medication List    TAKE these medications   apixaban 5 MG Tabs tablet Commonly known as: ELIQUIS Take 1 tablet (5 mg total) by mouth 2 (two) times  daily.   aspirin 81 MG EC tablet Take 1 tablet (81 mg total) by mouth daily.   cetirizine 10 MG tablet Commonly known as: ZYRTEC Take 1 tablet by mouth daily as needed for allergies.   HYDROcodone-acetaminophen 5-325 MG tablet Commonly known as: NORCO/VICODIN Take 2 tablets by mouth every 4 (four) hours as needed for moderate pain.      Verbal and written Discharge instructions given to the patient. Wound care per Discharge AVS Follow-up Information    Marty Heck, MD Follow up in 1 month(s).   Specialty: Vascular Surgery Why: office will  call Contact information: [address] [place] [address] [phone]           Signed: Roxy Horseman 04/05/2020, 8:22 AM

## 2020-04-06 ENCOUNTER — Telehealth (HOSPITAL_COMMUNITY): Payer: Self-pay

## 2020-04-06 NOTE — Telephone Encounter (Signed)

## 2020-04-07 ENCOUNTER — Other Ambulatory Visit: Payer: Self-pay

## 2020-04-07 ENCOUNTER — Ambulatory Visit (INDEPENDENT_AMBULATORY_CARE_PROVIDER_SITE_OTHER): Payer: Self-pay | Admitting: Physician Assistant

## 2020-04-07 ENCOUNTER — Ambulatory Visit (HOSPITAL_COMMUNITY)
Admission: RE | Admit: 2020-04-07 | Discharge: 2020-04-07 | Disposition: A | Discharge status: Home / Self Care | Payer: 59 | Source: Ambulatory Visit | Attending: Vascular Surgery | Admitting: Vascular Surgery

## 2020-04-07 VITALS — BP 123/85 | HR 80 | Temp 98.4°F | Resp 18 | Ht 76.0 in | Wt 288.0 lb

## 2020-04-07 DIAGNOSIS — I824Z2 Acute embolism and thrombosis of unspecified deep veins of left distal lower extremity: Secondary | ICD-10-CM

## 2020-04-07 NOTE — Progress Notes (Signed)
[facility] [place]   Reason for referral: Swollen left leg  History of Present Illness  Noah Moore is a 29 y.o. 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   He is following up with Heme-onc for hypercoagulable work.    He states the day after he got out of the hospital he drove to [place] and now he has increased pain behind his left knee.  No increased swelling note.  He denise loss of sensation and motor.      Past Medical History:  Diagnosis Date  . Allergy   . DVT (deep venous thrombosis) ([place])     Past Surgical History:  Procedure Laterality Date  . INTRAVASCULAR ULTRASOUND/IVUS N/A 03/30/2020   Procedure: INTRAVASCULAR ULTRASOUND/IVUS;  Surgeon: Marty Heck, MD;  Location: [facility];  Service: Cardiovascular;  Laterality: N/A;  . IVC VENOGRAPHY N/A 03/30/2020   Procedure: IVC Venography;  Surgeon: Marty Heck, MD;  Location: [facility];  Service: Cardiovascular;  Laterality: N/A;  . KNEE SURGERY Right   . LEG SURGERY     Reports history of right lower extremity tibia fracture in 2010 which was repaired surgically.  . LOWER EXTREMITY VENOGRAPHY Left 03/30/2020   Procedure: LOWER EXTREMITY VENOGRAPHY;  Surgeon: Marty Heck, MD;  Location: [facility];  Service: Cardiovascular;  Laterality: Left;  . PERIPHERAL VASCULAR THROMBECTOMY Left 03/30/2020   Procedure: PERIPHERAL VASCULAR THROMBECTOMY;  Surgeon: Marty Heck, MD;  Location: [facility];  Service: Cardiovascular;  Laterality: Left;    Social History   Socioeconomic History  . Marital status: Single    Spouse name: Not on file  . Number of children: Not on file  . Years of education: Not on file  . Highest education level: Not on file  Occupational History  . Not on file  Tobacco Use  . Smoking status: Current Some Day  Smoker    Types: E-cigarettes  . Smokeless tobacco: Never Used  Substance and Sexual Activity  . Alcohol use: Not Currently  . Drug use: Never  . Sexual activity: Not on file  Other Topics Concern  . Not on file  Social History Narrative  . Not on file   Social Determinants of Health   Financial Resource Strain:   . Difficulty of Paying Living Expenses:   Food Insecurity:   . Worried About Charity fundraiser in the Last Year:   . Arboriculturist in the Last Year:   Transportation Needs:   . Film/video editor (Medical):   Marland Kitchen Lack of Transportation (Non-Medical):   Physical Activity:   . Days of Exercise per Week:   . Minutes of Exercise per Session:   Stress:   . Feeling of Stress :   Social Connections:   . Frequency of Communication with Friends and Family:   . Frequency of Social Gatherings with Friends and Family:   . Attends Religious Services:   . Active Member of Clubs or Organizations:   . Attends Archivist Meetings:   Marland Kitchen Marital Status:   Intimate Partner Violence:   . Fear of Current or Ex-Partner:   . Emotionally Abused:   Marland Kitchen Physically Abused:   . Sexually Abused:     Family History  Adopted: Yes    Current Outpatient Medications on File Prior to Visit  Medication Sig Dispense Refill  .  apixaban (ELIQUIS) 5 MG TABS tablet Take 1 tablet (5 mg total) by mouth 2 (two) times daily. 60 tablet 11  . aspirin EC 81 MG EC tablet Take 1 tablet (81 mg total) by mouth daily. 30 tablet 11  . cetirizine (ZYRTEC) 10 MG tablet Take 1 tablet by mouth daily as needed for allergies.     Marland Kitchen HYDROcodone-acetaminophen (NORCO/VICODIN) 5-325 MG tablet Take 2 tablets by mouth every 4 (four) hours as needed for moderate pain. 12 tablet 0   No current facility-administered medications on file prior to visit.    Allergies as of 04/07/2020  . (No Known Allergies)     ROS:   General:  No weight loss, Fever, chills  HEENT: No recent headaches, no nasal  bleeding, no visual changes, no sore throat  Neurologic: No dizziness, blackouts, seizures. No recent symptoms of stroke or mini- stroke. No recent episodes of slurred speech, or temporary blindness.  Cardiac: No recent episodes of chest pain/pressure, no shortness of breath at rest.  No shortness of breath with exertion.  Denies history of atrial fibrillation or irregular heartbeat  Vascular: No history of rest pain in feet.  No history of claudication.  No history of non-healing ulcer, + history of DVT   Pulmonary: No home oxygen, no productive cough, no hemoptysis,  No asthma or wheezing  Musculoskeletal:  [ ]  Arthritis, [ ]  Low back pain,  [ ]  Joint pain  Hematologic:No history of hypercoagulable state.  No history of easy bleeding.  No history of anemia  Gastrointestinal: No hematochezia or melena,  No gastroesophageal reflux, no trouble swallowing  Urinary: [ ]  chronic Kidney disease, [ ]  on HD - [ ]  MWF or [ ]  TTHS, [ ]  Burning with urination, [ ]  Frequent urination, [ ]  Difficulty urinating;   Skin: No rashes  Psychological: No history of anxiety,  No history of depression  Physical Examination  Vitals:   04/07/20 1139  BP: 123/85  Pulse: 80  Resp: 18  Temp: 98.4 F (36.9 C)  TempSrc: Temporal  SpO2: 98%  Weight: 288 lb (130.6 kg)  Height: 6\' 4"  (1.93 m)    Body mass index is 35.06 kg/m.  General:  Alert and oriented, no acute distress HEENT: Normal Neck: No bruit or JVD Pulmonary: Clear to auscultation bilaterally Cardiac: Regular Rate and Rhythm without murmur Abdomen: Soft, non-tender, non-distended, no mass, no scars Skin: No rash Extremity Pulses:  2+ radial, brachial, femoral, dorsalis pedis, posterior tibial pulses bilaterally Musculoskeletal: No deformity or edema  Neurologic: Upper and lower extremity motor 5/5 and symmetric  DATA: +-----+---------------+---------+-----------+----------+--------------+   RIGHTCompressibilityPhasicitySpontaneityPropertiesThrombus Aging  +-----+---------------+---------+-----------+----------+--------------+  CFV Full      Yes   Yes                  +-----+---------------+---------+-----------+----------+--------------+         +---------+---------------+---------+-----------+--------------+-----------  ----+  LEFT   CompressibilityPhasicitySpontaneityProperties  Thrombus  Aging   +---------+---------------+---------+-----------+--------------+-----------  ----+  CFV   Partial    Yes   Yes    partially   Age                                 re-cannalized  Indeterminate   +---------+---------------+---------+-----------+--------------+-----------  ----+  SFJ   Full      Yes   Yes                     +---------+---------------+---------+-----------+--------------+-----------  ----+  FV Prox Partial         Yes    partially   Chronic                               re-cannalized           +---------+---------------+---------+-----------+--------------+-----------  ----+  FV Mid  Partial         Yes    partially   Chronic                               re-cannalized           +---------+---------------+---------+-----------+--------------+-----------  ----+  FV DistalNone           No     dilated    Age                                          Indeterminate   +---------+---------------+---------+-----------+--------------+-----------  ----+  PFV   None      No    No     softly    Acute                                echogenic              +---------+---------------+---------+-----------+--------------+-----------  ----+  POP   None      No    No     dilated    Age                                          Indeterminate   +---------+---------------+---------+-----------+--------------+-----------  ----+  PTV   Partial               with     Age                                 striations    Indeterminate   +---------+---------------+---------+-----------+--------------+-----------  ----+  PERO   Full                                  +---------+---------------+---------+-----------+--------------+-----------  ----+  GSV   Full                                  +---------+---------------+---------+-----------+--------------+-----------  ----+  SSV   Full                                  +---------+---------------+---------+-----------+--------------+-----------  ----+   Distal common femoral vein is dilated, non compressible and recanalized.  The profunda vein is non compressible. The femoral vein is partially  compressible proximal and dilated and non compressible distally. The  popliteal vein is dilated, non compressible  and recanalized with thrombus extending into the proximal posterior  tibial veins.           Summary:  RIGHT:  -  No evidence of common femoral vein obstruction.    LEFT:  - Findings consistent with age indeterminate deep vein thrombosis  involving the left common femoral vein, left femoral vein, and left  popliteal vein.  - There is recanalized thrombus in the left common femoral, femoral and  popliteal vein(s).  - Findings consistent with chronic deep vein thrombosis involving the left  femoral vein.   Assessment: DVT left LE with  Phlegmasia  s/p mechanical thrombectomy Venous duplex demonstrates  Distal common femoral vein is dilated, non compressible and recanalized.  The profunda vein is non compressible. The femoral vein is partially  compressible proximal and dilated and non compressible distally. The  popliteal vein is dilated, non compressible and recanalized with thrombus extending into the proximal posterior tibial veins.    Plan: He has no increased edema in the left LE and wears his thigh compression daily with Eliquis BID.  He does not have an option for further thrombectomy at this point.  He will continue the plan above and f/u up with Dr. Carlis Abbott at his regular scheduled appoint.  He will start a walking program daily, elevate his left LE when at rest and take frequent walking breaks when driving.       Roxy Horseman PA-C [facility] [place] Office: [phone]  MD in clinic [place]

## 2020-04-11 ENCOUNTER — Other Ambulatory Visit: Payer: Self-pay

## 2020-04-11 ENCOUNTER — Inpatient Hospital Stay: Payer: 59

## 2020-04-11 ENCOUNTER — Inpatient Hospital Stay: Payer: 59 | Attending: Hematology and Oncology | Admitting: Hematology and Oncology

## 2020-04-11 VITALS — BP 126/72 | HR 94 | Temp 98.9°F | Resp 18 | Ht 76.0 in | Wt 297.7 lb

## 2020-04-11 DIAGNOSIS — E669 Obesity, unspecified: Secondary | ICD-10-CM | POA: Diagnosis not present

## 2020-04-11 DIAGNOSIS — K625 Hemorrhage of anus and rectum: Secondary | ICD-10-CM | POA: Insufficient documentation

## 2020-04-11 DIAGNOSIS — I82412 Acute embolism and thrombosis of left femoral vein: Secondary | ICD-10-CM | POA: Diagnosis present

## 2020-04-11 DIAGNOSIS — Z7901 Long term (current) use of anticoagulants: Secondary | ICD-10-CM | POA: Insufficient documentation

## 2020-04-11 DIAGNOSIS — I824Z2 Acute embolism and thrombosis of unspecified deep veins of left distal lower extremity: Secondary | ICD-10-CM

## 2020-04-11 LAB — CBC WITH DIFFERENTIAL (CANCER CENTER ONLY)
Abs Immature Granulocytes: 0.11 10*3/uL — ABNORMAL HIGH (ref 0.00–0.07)
Basophils Absolute: 0.1 10*3/uL (ref 0.0–0.1)
Basophils Relative: 1 %
Eosinophils Absolute: 0.1 10*3/uL (ref 0.0–0.5)
Eosinophils Relative: 1 %
HCT: 39 % (ref 39.0–52.0)
Hemoglobin: 12.2 g/dL — ABNORMAL LOW (ref 13.0–17.0)
Immature Granulocytes: 1 %
Lymphocytes Relative: 21 %
Lymphs Abs: 2.2 10*3/uL (ref 0.7–4.0)
MCH: 27.3 pg (ref 26.0–34.0)
MCHC: 31.3 g/dL (ref 30.0–36.0)
MCV: 87.2 fL (ref 80.0–100.0)
Monocytes Absolute: 1 10*3/uL (ref 0.1–1.0)
Monocytes Relative: 10 %
Neutro Abs: 7.2 10*3/uL (ref 1.7–7.7)
Neutrophils Relative %: 66 %
Platelet Count: 439 10*3/uL — ABNORMAL HIGH (ref 150–400)
RBC: 4.47 MIL/uL (ref 4.22–5.81)
RDW: 13.3 % (ref 11.5–15.5)
WBC Count: 10.7 10*3/uL — ABNORMAL HIGH (ref 4.0–10.5)
nRBC: 0 % (ref 0.0–0.2)

## 2020-04-11 LAB — CMP (CANCER CENTER ONLY)
ALT: 44 U/L (ref 0–44)
AST: 17 U/L (ref 15–41)
Albumin: 3.5 g/dL (ref 3.5–5.0)
Alkaline Phosphatase: 119 U/L (ref 38–126)
Anion gap: 10 (ref 5–15)
BUN: 7 mg/dL (ref 6–20)
CO2: 27 mmol/L (ref 22–32)
Calcium: 9.3 mg/dL (ref 8.9–10.3)
Chloride: 104 mmol/L (ref 98–111)
Creatinine: 0.85 mg/dL (ref 0.61–1.24)
GFR, Est AFR Am: >60 mL/min (ref >60)
GFR, Estimated: >60 mL/min (ref >60)
Glucose, Bld: 85 mg/dL (ref 70–99)
Potassium: 4.1 mmol/L (ref 3.5–5.1)
Sodium: 141 mmol/L (ref 135–145)
Total Bilirubin: 0.5 mg/dL (ref 0.3–1.2)
Total Protein: 8.2 g/dL — ABNORMAL HIGH (ref 6.5–8.1)

## 2020-04-11 NOTE — Progress Notes (Signed)
[place] [phone]   [phone]  INITIAL CONSULT NOTE  Patient Care Team: Patient, No Pcp Per as PCP - General (General Practice)  Hematological/Oncological History # Extensive Left Lower Extremity DVT, Provoked 1) 03/16/2020: presented to the [facility] ED with leg pain/swelling. [place] LE showed acute deep vein thrombosis involving the left common femoral vein, left femoral vein, and left popliteal vein. Started on anticoagulation therapy. Admitted 4/21-4/24/2021. D/c on Xarelto 2) 03/17/2020: CTA shows no evidence of PE 3) 03/30/2020: patient presented to ED with worsening pain. Admitted again and underwent peripheral vascular thrombectomy. Transitioned to apixaban 4) 04/11/2020: establish care with Dr. Lorenso Courier   CHIEF COMPLAINTS/PURPOSE OF CONSULTATION:  "LLE DVT "  HISTORY OF PRESENTING ILLNESS:  Noah Moore 29 y.o. male with medical history significant for allergy and obesity who presents for evaluation of a newly diagnosed left lower extremity DVT.  On review of the previous records Noah Moore presented the emergency department on 03/16/2020 with leg pain and swelling which had begun approximately Easter weekend.  The patient had previously been seen in urgent care at which time a diagnosis of cellulitis versus shingles was made and he was treated with antiviral therapy.  When his symptoms fail to resolve he was told to undergo evaluation with a lower extremity ultrasound.  Lower extremity ultrasound revealed a left common femoral vein, left femoral vein, and popliteal vein deep vein thromboses.  He was admitted to the hospital started on anticoagulation therapy.  During that admission on 03/17/2020 a CTA study showed no evidence of pulmonary embolism.  He was discharged on Xarelto therapy.  The patient symptoms fail to resolve and on 03/31/2019 when he presented to the emergency department again with worsening pain.  He underwent evaluation and it was  determined that a peripheral vascular thrombectomy was appropriate.  He underwent the procedure without complication and was transitioned to Eliquis therapy instead.  He was scheduled for follow-up with hematology for further evaluation and management of his large thromboembolism.  On exam today Noah Moore notes that he spends a great deal of his time traveling via car and frequently makes car rides 2 to 3 hours in length.  He notes that he first recognized symptoms in his lower extremity Easter weekend and that he had driven 5.5 hours to his parents house in [place] that weekend with only one brief stop for gas.  He reports that he has been doing well on anticoagulation therapy, but has been noticing blood in his bowel movements.  He reports that he was started on opioid therapy for his lower extremity pain which was thought to be due to shingles.  He notes that the opioid therapy caused marked constipation and that he had a strained bowel movement which he stated "felt like it had ripped something".  He notes that his bowel movements are not currently painful but that he is seeing blood within the toilet bowl.  On further discussion he reports that his legs is painful, but not as severe as prior.  He notes that it is about a 6 out of 10 in severity, and that is worst prior to intervention it was 9-10 in severity.  He notes that he is able to walk on it now much better and the pain medication is helped taking the edge off.  He notes that he is no longer taking the opioid therapy and is managing with Tylenol alone at this time.  He notes that he has no prior history  of VTE and also denies any family history of VTE.  The patient was previously a smoker and quit approximately 6 months ago.  Additionally he was a vaper and quit this 2 months ago after 1 to 2 years of use.  He reports that he was once a much heavier drinker, but now he only drinks about 3 times per day with his drinks of choice being beer as  well as [employer] vodka.  He otherwise denies any bruising, nosebleeds, dark stools, or headaches.  A full 10 point ROS is listed below.  MEDICAL HISTORY:  Past Medical History:  Diagnosis Date  . Allergy   . DVT (deep venous thrombosis) ([place])     SURGICAL HISTORY: Past Surgical History:  Procedure Laterality Date  . INTRAVASCULAR ULTRASOUND/IVUS N/A 03/30/2020   Procedure: INTRAVASCULAR ULTRASOUND/IVUS;  Surgeon: Marty Heck, MD;  Location: [place] [facility];  Service: Cardiovascular;  Laterality: N/A;  . IVC VENOGRAPHY N/A 03/30/2020   Procedure: IVC Venography;  Surgeon: Marty Heck, MD;  Location: [place] [facility];  Service: Cardiovascular;  Laterality: N/A;  . KNEE SURGERY Right   . LEG SURGERY     Reports history of right lower extremity tibia fracture in 2010 which was repaired surgically.  . LOWER EXTREMITY VENOGRAPHY Left 03/30/2020   Procedure: LOWER EXTREMITY VENOGRAPHY;  Surgeon: Marty Heck, MD;  Location: [place] [facility];  Service: Cardiovascular;  Laterality: Left;  . PERIPHERAL VASCULAR THROMBECTOMY Left 03/30/2020   Procedure: PERIPHERAL VASCULAR THROMBECTOMY;  Surgeon: Marty Heck, MD;  Location: [place] [facility];  Service: Cardiovascular;  Laterality: Left;    SOCIAL HISTORY: Social History   Socioeconomic History  . Marital status: Single    Spouse name: Not on file  . Number of children: Not on file  . Years of education: Not on file  . Highest education level: Not on file  Occupational History  . Not on file  Tobacco Use  . Smoking status: Current Some Day Smoker    Types: E-cigarettes  . Smokeless tobacco: Never Used  Substance and Sexual Activity  . Alcohol use: Not Currently  . Drug use: Never  . Sexual activity: Not on file  Other Topics Concern  . Not on file  Social History Narrative  . Not on file   Social Determinants of Health   Financial Resource Strain:   . Difficulty of Paying Living Expenses:     Food Insecurity:   . Worried About Charity fundraiser in the Last Year:   . Arboriculturist in the Last Year:   Transportation Needs:   . Film/video editor (Medical):   Marland Kitchen Lack of Transportation (Non-Medical):   Physical Activity:   . Days of Exercise per Week:   . Minutes of Exercise per Session:   Stress:   . Feeling of Stress :   Social Connections:   . Frequency of Communication with Friends and Family:   . Frequency of Social Gatherings with Friends and Family:   . Attends Religious Services:   . Active Member of Clubs or Organizations:   . Attends Archivist Meetings:   Marland Kitchen Marital Status:   Intimate Partner Violence:   . Fear of Current or Ex-Partner:   . Emotionally Abused:   Marland Kitchen Physically Abused:   . Sexually Abused:     FAMILY HISTORY: Family History  Adopted: Yes    ALLERGIES:  has No Known Allergies.  MEDICATIONS:  Current Outpatient  Medications  Medication Sig Dispense Refill  . apixaban (ELIQUIS) 5 MG TABS tablet Take 1 tablet (5 mg total) by mouth 2 (two) times daily. 60 tablet 11  . aspirin EC 81 MG EC tablet Take 1 tablet (81 mg total) by mouth daily. 30 tablet 11  . cetirizine (ZYRTEC) 10 MG tablet Take 1 tablet by mouth daily as needed for allergies.      No current facility-administered medications for this visit.    REVIEW OF SYSTEMS:   Constitutional: ( - ) fevers, ( - )  chills , ( - ) night sweats Eyes: ( - ) blurriness of vision, ( - ) double vision, ( - ) watery eyes Ears, nose, mouth, throat, and face: ( - ) mucositis, ( - ) sore throat Respiratory: ( - ) cough, ( - ) dyspnea, ( - ) wheezes Cardiovascular: ( - ) palpitation, ( - ) chest discomfort, ( - ) lower extremity swelling Gastrointestinal:  ( - ) nausea, ( - ) heartburn, ( - ) change in bowel habits Skin: ( - ) abnormal skin rashes Lymphatics: ( - ) new lymphadenopathy, ( - ) easy bruising Neurological: ( - ) numbness, ( - ) tingling, ( - ) new  weaknesses Behavioral/Psych: ( - ) mood change, ( - ) new changes  All other systems were reviewed with the patient and are negative.  PHYSICAL EXAMINATION: ECOG PERFORMANCE STATUS: 1 - Symptomatic but completely ambulatory  Vitals:   04/11/20 0901  BP: 126/72  Pulse: 94  Resp: 18  Temp: 98.9 F (37.2 C)  SpO2: 100%   Filed Weights   04/11/20 0901  Weight: 297 lb 11.2 oz (135 kg)    GENERAL: well appearing young male in NAD  SKIN: skin color, texture, turgor are normal, no rashes or significant lesions EYES: conjunctiva are pink and non-injected, sclera clear HEART: regular rate & rhythm and no murmurs  ABDOMEN: soft, non-tender, non-distended, normal bowel sounds Musculoskeletal: no cyanosis of digits and no clubbing. Compression stocking on left leg, unable to assess LE edema.   PSYCH: alert & oriented x 3, fluent speech NEURO: no focal motor/sensory deficits  LABORATORY DATA:  I have reviewed the data as listed CBC Latest Ref Rng & Units 04/11/2020 03/30/2020 03/18/2020  WBC 4.0 - 10.5 K/uL 10.7(H) - 10.9(H)  Hemoglobin 13.0 - 17.0 g/dL 12.2(L) 12.9(L) 14.2  Hematocrit 39.0 - 52.0 % 39.0 38.0(L) 43.4  Platelets 150 - 400 K/uL 439(H) - 481(H)    CMP Latest Ref Rng & Units 04/11/2020 03/30/2020 03/19/2020  Glucose 70 - 99 mg/dL 85 87 93  BUN 6 - 20 mg/dL 7 5(L) 7  Creatinine 0.61 - 1.24 mg/dL 0.85 0.70 0.70  Sodium 135 - 145 mmol/L 141 140 137  Potassium 3.5 - 5.1 mmol/L 4.1 4.1 3.9  Chloride 98 - 111 mmol/L 104 104 104  CO2 22 - 32 mmol/L 27 - 22  Calcium 8.9 - 10.3 mg/dL 9.3 - 9.2  Total Protein 6.5 - 8.1 g/dL 8.2(H) - 8.0  Total Bilirubin 0.3 - 1.2 mg/dL 0.5 - 0.7  Alkaline Phos 38 - 126 U/L 119 - 125  AST 15 - 41 U/L 17 - 23  ALT 0 - 44 U/L 44 - 45(H)     PATHOLOGY: None relevant to review.    RADIOGRAPHIC STUDIES:  DG Chest 2 View  Result Date: 03/16/2020 CLINICAL DATA:  29 year old male with left lower extremity pain. Recent blood clot. EXAM: CHEST - 2  VIEW COMPARISON:  None FINDINGS: The heart size and mediastinal contours are within normal limits. Both lungs are clear. The visualized skeletal structures are unremarkable. IMPRESSION: No active cardiopulmonary disease. Electronically Signed   By: Anner Crete M.D.   On: 03/16/2020 19:11   CT ANGIO CHEST PE W OR WO CONTRAST  Result Date: 03/17/2020 CLINICAL DATA:  Left leg pain and swelling with history of DVT. EXAM: CT ANGIOGRAPHY CHEST WITH CONTRAST TECHNIQUE: Multidetector CT imaging of the chest was performed using the standard protocol during bolus administration of intravenous contrast. Multiplanar CT image reconstructions and MIPs were obtained to evaluate the vascular anatomy. CONTRAST:  81mL OMNIPAQUE IOHEXOL 350 MG/ML SOLN COMPARISON:  None. FINDINGS: Cardiovascular: The heart is normal in size. No pericardial effusion. The aorta is normal in caliber. No dissection. No atherosclerotic calcifications. The branch vessels are patent. The left vertebral artery comes directly off the aorta. No coronary artery calcifications. The pulmonary arterial tree is fairly well opacified. No definite filling defects to suggest pulmonary embolism. Mediastinum/Nodes: No mediastinal or hilar mass or adenopathy. Small scattered lymph nodes are noted. The esophagus is grossly normal. The thyroid gland is unremarkable. Lungs/Pleura: The lungs are clear. No pleural effusion or pulmonary lesions. Upper Abdomen: No significant upper abdominal findings. Musculoskeletal: No chest wall mass, supraclavicular or axillary adenopathy. The bony thorax is intact. Review of the MIP images confirms the above findings. IMPRESSION: 1. No CT findings for pulmonary embolism. 2. Normal thoracic aorta. 3. No acute pulmonary findings or worrisome pulmonary lesions. Electronically Signed   By: Marijo Sanes M.D.   On: 03/17/2020 07:35   CARDIAC CATHETERIZATION  Result Date: 03/30/2020 Patient name: Noah Moore         MRN: [id number]         DOB: 1991-03-05            Sex: male  03/30/2020 Pre-operative Diagnosis: Extensive left leg DVT Post-operative diagnosis:  Same Surgeon:  Marty Heck, MD Procedure Performed: 1.  Ultrasound-guided access of left popliteal vein 2.  Catheter selection of inferior vena cava with left leg venogram 3.  Intravascular ultrasound (IVUS) of left popliteal vein, superficial femoral vein, common femoral vein, external iliac vein, common iliac vein, inferior vena cava 4.  Percutaneous mechanical thrombectomy of left popliteal and superficial femoral vein (Innari ClotTriever) 5.  Left popliteal and superficial femoral vein venoplasty (7 mm and 8 mm Mustang) 6.  76 minutes of monitored moderate conscious sedation  Indications: Patient is a 29 year old male who was seen several weeks ago in consultation with extensive left leg DVT.  Ultimately there was evidence of thrombus in the common femoral vein, superficial femoral vein and popliteal vein.  He ultimately improved with conservative measures and was discharged home on Eliquis.  On follow-up he was having worsening leg symptoms and felt his leg was not tolerable.  He presents today for planned percutaneous thrombectomy and IVUS of his extensive left leg DVT after risks benefits discussed.  Findings:  Ultrasound-guided access of the left popliteal vein was very difficult given significant edema as well as obesity.  Ultimately after ultrasound-guided access was obtained IVUS confirmed more subacute to chronic appearing thrombus in the left popliteal and superficial femoral vein segment.  The left common femoral vein, external iliac vein, common iliac vein and IVC were all patent and free of thrombus.  Subsequently performed percutaenous mechanical thrombectomy and mostly chronic appearing thrombus was retrieved from the left superficial femoral vein and popliteal vein segment.  Subsequently performed balloon angioplasty of  the entire superficial femoral vein and  popliteal vein segment to get a flow channel with 7 mm 8 mm Mustang.  He now has good flow channel in the left popliteal and superficial femoral vein as noted on venogram.             Procedure:  The patient was identified in the holding area and taken to room 8.  The patient was placed prone on the table.  His left knee was then prepped and draped in usual sterile fashion on the posterior surface.  Initially used ultrasound to evaluate the left popliteal vein, it was not patent, there was acute appearing thrombus.  It should be noted that due to significant edema in the leg was very difficult to visualize the vein versus the artery.  Ultimately I was able to finally get access in the popliteal vein with a micro access needle and advanced a micro access wire and placed a micro sheath.  I did a brief hand-injection confirmed indeed I was in the popliteal vein which I was.  I then used a Glidewire advantage and had some resistance crossing the thrombus in the popliteal and superficial femoral vein segments suggesting more chronic etiology.  I then placed a short 5 French sheath in with a KMP catheter finally got across all the occluded segment into the common femoral vein and iliac vein where the wire passed much easier.  We then performed IVUS of the left popliteal, superficial femoral vein, common femoral vein, external iliac common iliac vein and IVC with pertinent findings noted above.  He was given 5000 units of IV heparin and then additional 3000 units of IV heparin.  I then elected to perform percutaneous mechanical thrombectomy with Inari ClotTriever.  The 38 French sheath for the device was placed in the popliteal vein.  There were 3 passes that were made initially with the basket deployed in IVC and this was brought down through the iliac and femoral vein and popliteal segment and collapsed.  Most of the thrombus that was retrieved was more chronic appearing.  I performed IVUS again and there was still more  chronic appearing thrombus throughout the popliteal and superficial femoral vein segment.  I then elected to perform balloon angioplasty to get a flow channel with 7 mm and 8 mm Mustang to nominal pressure for 2 minutes about the entire segment in the popliteal and superficial femoral vein segment.  I then performed hand-injection venogram that showed a much better flow channel but there was one area where there appeared to be some limiting residual thrombus.  I made one additional pass with ClotTriever for four passes total and got more chronic appearing thrombus.  Final venogram showed flow through the popliteal and superficial vein femoral vein segment and that obviously a already patent iliac vein segment.  Happy with the results wires and catheters were removed.  The 13 French sheath was removed and a 4-0 pursestring was tied down and manual pressure was held.  Taken to recovery in stable condition.    Marty Heck, MD [facility] Office: [phone]   PERIPHERAL VASCULAR CATHETERIZATION  Result Date: 03/30/2020 Patient name: Noah Moore         MRN: [id number]        DOB: 1991-03-01            Sex: male  03/30/2020 Pre-operative Diagnosis: Extensive left leg DVT Post-operative diagnosis:  Same Surgeon:  Marty Heck, MD Procedure Performed: 1.  Ultrasound-guided access of left popliteal vein 2.  Catheter selection of inferior vena cava with left leg venogram 3.  Intravascular ultrasound (IVUS) of left popliteal vein, superficial femoral vein, common femoral vein, external iliac vein, common iliac vein, inferior vena cava 4.  Percutaneous mechanical thrombectomy of left popliteal and superficial femoral vein (Innari ClotTriever) 5.  Left popliteal and superficial femoral vein venoplasty (7 mm and 8 mm Mustang) 6.  76 minutes of monitored moderate conscious sedation  Indications: Patient is a 29 year old male who was seen several weeks ago in consultation  with extensive left leg DVT.  Ultimately there was evidence of thrombus in the common femoral vein, superficial femoral vein and popliteal vein.  He ultimately improved with conservative measures and was discharged home on Eliquis.  On follow-up he was having worsening leg symptoms and felt his leg was not tolerable.  He presents today for planned percutaneous thrombectomy and IVUS of his extensive left leg DVT after risks benefits discussed.  Findings:  Ultrasound-guided access of the left popliteal vein was very difficult given significant edema as well as obesity.  Ultimately after ultrasound-guided access was obtained IVUS confirmed more subacute to chronic appearing thrombus in the left popliteal and superficial femoral vein segment.  The left common femoral vein, external iliac vein, common iliac vein and IVC were all patent and free of thrombus.  Subsequently performed percutaenous mechanical thrombectomy and mostly chronic appearing thrombus was retrieved from the left superficial femoral vein and popliteal vein segment.  Subsequently performed balloon angioplasty of the entire superficial femoral vein and popliteal vein segment to get a flow channel with 7 mm 8 mm Mustang.  He now has good flow channel in the left popliteal and superficial femoral vein as noted on venogram.             Procedure:  The patient was identified in the holding area and taken to room 8.  The patient was placed prone on the table.  His left knee was then prepped and draped in usual sterile fashion on the posterior surface.  Initially used ultrasound to evaluate the left popliteal vein, it was not patent, there was acute appearing thrombus.  It should be noted that due to significant edema in the leg was very difficult to visualize the vein versus the artery.  Ultimately I was able to finally get access in the popliteal vein with a micro access needle and advanced a micro access wire and placed a micro sheath.  I did a brief  hand-injection confirmed indeed I was in the popliteal vein which I was.  I then used a Glidewire advantage and had some resistance crossing the thrombus in the popliteal and superficial femoral vein segments suggesting more chronic etiology.  I then placed a short 5 French sheath in with a KMP catheter finally got across all the occluded segment into the common femoral vein and iliac vein where the wire passed much easier.  We then performed IVUS of the left popliteal, superficial femoral vein, common femoral vein, external iliac common iliac vein and IVC with pertinent findings noted above.  He was given 5000 units of IV heparin and then additional 3000 units of IV heparin.  I then elected to perform percutaneous mechanical thrombectomy with Inari ClotTriever.  The 47 French sheath for the device was placed in the popliteal vein.  There were 3 passes that were made initially with the basket deployed in IVC and this was brought down through the iliac and femoral vein  and popliteal segment and collapsed.  Most of the thrombus that was retrieved was more chronic appearing.  I performed IVUS again and there was still more chronic appearing thrombus throughout the popliteal and superficial femoral vein segment.  I then elected to perform balloon angioplasty to get a flow channel with 7 mm and 8 mm Mustang to nominal pressure for 2 minutes about the entire segment in the popliteal and superficial femoral vein segment.  I then performed hand-injection venogram that showed a much better flow channel but there was one area where there appeared to be some limiting residual thrombus.  I made one additional pass with ClotTriever for four passes total and got more chronic appearing thrombus.  Final venogram showed flow through the popliteal and superficial vein femoral vein segment and that obviously a already patent iliac vein segment.  Happy with the results wires and catheters were removed.  The 13 French sheath was removed  and a 4-0 pursestring was tied down and manual pressure was held.  Taken to recovery in stable condition.    Marty Heck, MD [facility] Office: [phone]   VAS [place] LOWER EXTREMITY VENOUS (DVT)  Result Date: 04/07/2020  Lower Venous DVTStudy Indications: Swelling, Edema, and Pain. Other Indications: Recent long car trip followed by resurgence of left leg edema                    and pain. Risk Factors: Recent extended travel DVT Recent left CFV, Fem vein and popliteal vein DVT 03/16/2020 Surgery Thrombectomy of femoral and popliteal veins with Venoplasty 03/30/2020. Anticoagulation: Eliquis. Performing Technologist: Delorise Shiner RVT  Examination Guidelines: A complete evaluation includes B-mode imaging, spectral Doppler, color Doppler, and power Doppler as needed of all accessible portions of each vessel. Bilateral testing is considered an integral part of a complete examination. Limited examinations for reoccurring indications may be performed as noted. The reflux portion of the exam is performed with the patient in reverse Trendelenburg.  +-----+---------------+---------+-----------+----------+--------------+ RIGHTCompressibilityPhasicitySpontaneityPropertiesThrombus Aging +-----+---------------+---------+-----------+----------+--------------+ CFV  Full           Yes      Yes                                 +-----+---------------+---------+-----------+----------+--------------+   +---------+---------------+---------+-----------+--------------+---------------+ LEFT     CompressibilityPhasicitySpontaneityProperties    Thrombus Aging  +---------+---------------+---------+-----------+--------------+---------------+ CFV      Partial        Yes      Yes        partially     Age                                                         re-cannalized Indeterminate    +---------+---------------+---------+-----------+--------------+---------------+ SFJ      Full           Yes      Yes                                      +---------+---------------+---------+-----------+--------------+---------------+ FV Prox  Partial                 Yes  partially     Chronic                                                     re-cannalized                 +---------+---------------+---------+-----------+--------------+---------------+ FV Mid   Partial                 Yes        partially     Chronic                                                     re-cannalized                 +---------+---------------+---------+-----------+--------------+---------------+ FV DistalNone                    No         dilated       Age                                                                       Indeterminate   +---------+---------------+---------+-----------+--------------+---------------+ PFV      None           No       No         softly        Acute                                                       echogenic                     +---------+---------------+---------+-----------+--------------+---------------+ POP      None           No       No         dilated       Age                                                                       Indeterminate   +---------+---------------+---------+-----------+--------------+---------------+ PTV      Partial                            with          Age  striations    Indeterminate   +---------+---------------+---------+-----------+--------------+---------------+ PERO     Full                                                             +---------+---------------+---------+-----------+--------------+---------------+ GSV      Full                                                              +---------+---------------+---------+-----------+--------------+---------------+ SSV      Full                                                             +---------+---------------+---------+-----------+--------------+---------------+ Distal common femoral vein is dilated, non compressible and recanalized. The profunda vein is non compressible. The femoral vein is partially compressible proximal and dilated and non compressible distally. The popliteal vein is dilated, non compressible  and recanalized with thrombus extending into the proximal posterior tibial veins.    Summary: RIGHT: - No evidence of common femoral vein obstruction.  LEFT: - Findings consistent with age indeterminate deep vein thrombosis involving the left common femoral vein, left femoral vein, and left popliteal vein. - There is recanalized thrombus in the left common femoral, femoral and popliteal vein(s). - Findings consistent with chronic deep vein thrombosis involving the left femoral vein.  *See table(s) above for measurements and observations. Electronically signed by Ruta Hinds MD on 04/07/2020 at 12:17:30 PM.    Final    VAS [place] LOWER EXTREMITY VENOUS (DVT) (ONLY [facility] & [facility])  Result Date: 03/17/2020  Lower Venous DVTStudy Indications: Pain, and recent RLE DVT diagnosis- unable to obtain films or report.  Limitations: Body habitus and poor ultrasound/tissue interface. Comparison Study: No prior study Performing Technologist: Maudry Mayhew MHA, RDMS, RVT, RDCS  Examination Guidelines: A complete evaluation includes B-mode imaging, spectral Doppler, color Doppler, and power Doppler as needed of all accessible portions of each vessel. Bilateral testing is considered an integral part of a complete examination. Limited examinations for reoccurring indications may be performed as noted. The reflux portion of the exam is performed with the patient in reverse Trendelenburg.   +-----+---------------+---------+-----------+----------+--------------+ RIGHTCompressibilityPhasicitySpontaneityPropertiesThrombus Aging +-----+---------------+---------+-----------+----------+--------------+ CFV  Full           Yes      Yes                                 +-----+---------------+---------+-----------+----------+--------------+   +---------+---------------+---------+-----------+----------+--------------+ LEFT     CompressibilityPhasicitySpontaneityPropertiesThrombus Aging +---------+---------------+---------+-----------+----------+--------------+ CFV      None                    No                   Acute          +---------+---------------+---------+-----------+----------+--------------+ SFJ      Full                                                        +---------+---------------+---------+-----------+----------+--------------+  FV Prox  None                                         Acute          +---------+---------------+---------+-----------+----------+--------------+ FV Mid   None                                         Acute          +---------+---------------+---------+-----------+----------+--------------+ FV DistalNone                                         Acute          +---------+---------------+---------+-----------+----------+--------------+ POP      None                    No                   Acute          +---------+---------------+---------+-----------+----------+--------------+ PTV      Full                    Yes                                 +---------+---------------+---------+-----------+----------+--------------+ EIV                              Yes        Patent                   +---------+---------------+---------+-----------+----------+--------------+   Left Technical Findings: Not visualized segments include PFV, peroneal veins, left common iliac vein, IVC, limited visualization left EIV.    Summary: RIGHT: - No evidence of common femoral vein obstruction.  LEFT: - Findings consistent with acute deep vein thrombosis involving the left common femoral vein, left femoral vein, and left popliteal vein. - No cystic structure found in the popliteal fossa.  *See table(s) above for measurements and observations. Electronically signed by Monica Martinez MD on 03/17/2020 at 8:02:59 PM.    Final     ASSESSMENT & PLAN Noah Moore 29 y.o. male with medical history significant for allergy and obesity who presents for evaluation of a newly diagnosed left lower extremity DVT.  After review of the imaging, review the outside records, discussion with the patient his findings are most consistent with an extensive left lower extremity DVT provoked due to a prolonged car ride.  Given these findings I think the patient can be on anticoagulation therapy for a limited duration of 3 to 6 months.  Final duration we decide a plan will be based upon his symptoms.  If the patient were to have resolution of his symptoms 3 months time we could safely discontinue.  Additionally if his rectal bleeding does not resolve by that time we may need to consider discontinuation of therapy to better control this bleeding.  Full hypercoagulation work-up is not merited at this time as there is a clear provoking factor.  His young age is somewhat concerning, but given his obesity and long  periods of stasis it is certainly a possible cause.  I do think that a compression syndrome such as May Thurner would have likely been detected during the vascular evaluation, but we can touch base with the vascular team to assure that they are confident there is no compression syndrome in hand.  In terms of his rectal bleeding it is most suspicious for hemorrhoidal bleeding in the setting of constipation from opioid use.  I recommend stool softeners as well as a conservative approach to management of these hemorrhoids.  If the bleeding were to  worsen or the patient were to develop signs or symptoms of anemia we could refer the patient to colorectal surgery for evaluation to assure there is no alternative etiology for these findings.  We will have the patient return in 2 months time (in July 2021) to discuss treatment options moving forward.  # Extensive Left Lower Extremity DVT, Provoked --findings are most consistent with a provoked DVT in the setting of prolong car travel (5.5 hours to [place], [place]). Given this I would recommend anticoagulation therapy for 3-6 months. --given the provoked nature of this clot a full hypercoagulation workup is not merited. We will assess for antiphospholipid antibodies to assure eliquis is appropriate therapy.  --continue eliquis 5mg  BID as prescribed.  --continue to monitor rectal bleeding (detailed below) --f/u in 2 months time to assess at the 3 months mark to see if further therapy is required. Duration of therapy will be dictated by symptoms.   #Rectal Bleeding --findings are most consistent with internal hemorrhoids developing due to opioid induced constipation --will monitor Hgb to assure loss is not too severe --strict return precautions for lightheadedness, shortness of breath, or uncontrollable bleeding.  --if hemorrhoid regimen does not work (stool softeners, prn miralax) to help resolve his symptoms can consider referral to colorectal surgery to assure hemorrhoids are the cause and determine what intervention can be undertaken. --continue to monitor.   Orders Placed This Encounter  Procedures  . CBC with Differential ([facility] Only)    Standing Status:   Future    Number of Occurrences:   1    Standing Expiration Date:   04/11/2021  . CMP ([facility] only)    Standing Status:   Future    Number of Occurrences:   1    Standing Expiration Date:   04/11/2021  . Cardiolipin antibodies, IgG, IgM, IgA*    Standing Status:   Future    Number of Occurrences:   1    Standing  Expiration Date:   04/11/2021  . Beta-2-glycoprotein i abs, IgG/M/A    Standing Status:   Future    Number of Occurrences:   1    Standing Expiration Date:   04/11/2021   All questions were answered. The patient knows to call the clinic with any problems, questions or concerns.  A total of more than 60 minutes were spent on this encounter and over half of that time was spent on counseling and coordination of care as outlined above.   Ledell Peoples, MD Department of Hematology/Oncology [place] at [facility] Phone: [phone] Pager: [phone] Email: [email or web address]  04/12/2020 8:18 PM

## 2020-04-12 ENCOUNTER — Encounter: Payer: Self-pay | Admitting: Hematology and Oncology

## 2020-04-12 ENCOUNTER — Telehealth: Payer: Self-pay | Admitting: Hematology and Oncology

## 2020-04-12 LAB — BETA-2-GLYCOPROTEIN I ABS, IGG/M/A
Beta-2 Glyco I IgG: <9 GPI IgG units (ref 0–20)
Beta-2-Glycoprotein I IgA: <9 GPI IgA units (ref 0–25)
Beta-2-Glycoprotein I IgM: <9 GPI IgM units (ref 0–32)

## 2020-04-12 LAB — CARDIOLIPIN ANTIBODIES, IGG, IGM, IGA
Anticardiolipin IgA: <9 APL U/mL (ref 0–11)
Anticardiolipin IgG: <9 GPL U/mL (ref 0–14)
Anticardiolipin IgM: 9 MPL U/mL (ref 0–12)

## 2020-04-12 NOTE — Telephone Encounter (Signed)
Scheduled per los. Called and spoke with patient. Confirmed appt 

## 2020-04-29 ENCOUNTER — Other Ambulatory Visit: Payer: Self-pay | Admitting: *Deleted

## 2020-04-29 DIAGNOSIS — I824Z2 Acute embolism and thrombosis of unspecified deep veins of left distal lower extremity: Secondary | ICD-10-CM

## 2020-05-02 ENCOUNTER — Ambulatory Visit: Payer: 59 | Attending: Internal Medicine

## 2020-05-02 DIAGNOSIS — Z20822 Contact with and (suspected) exposure to covid-19: Secondary | ICD-10-CM

## 2020-05-03 ENCOUNTER — Ambulatory Visit (INDEPENDENT_AMBULATORY_CARE_PROVIDER_SITE_OTHER): Payer: 59 | Admitting: Vascular Surgery

## 2020-05-03 ENCOUNTER — Other Ambulatory Visit: Payer: Self-pay

## 2020-05-03 ENCOUNTER — Ambulatory Visit (HOSPITAL_COMMUNITY)
Admission: RE | Admit: 2020-05-03 | Discharge: 2020-05-03 | Disposition: A | Discharge status: Home / Self Care | Payer: 59 | Source: Ambulatory Visit | Attending: Vascular Surgery | Admitting: Vascular Surgery

## 2020-05-03 ENCOUNTER — Encounter: Payer: Self-pay | Admitting: Vascular Surgery

## 2020-05-03 VITALS — BP 114/76 | HR 82 | Temp 97.5°F | Resp 14 | Ht 76.0 in | Wt 292.0 lb

## 2020-05-03 DIAGNOSIS — I824Z2 Acute embolism and thrombosis of unspecified deep veins of left distal lower extremity: Secondary | ICD-10-CM

## 2020-05-03 LAB — SARS-COV-2, NAA 2 DAY TAT

## 2020-05-03 LAB — NOVEL CORONAVIRUS, NAA: SARS-CoV-2, NAA: NOT DETECTED

## 2020-05-03 NOTE — Progress Notes (Signed)
Patient name: Noah Moore MRN: [id number] DOB: [birth year] Sex: male  REASON FOR VISIT: F/U after left leg thrombectomy for DVT  HPI: Noah Moore is a 29 y.o. male that presents for follow-up after percutaneous mechanical thrombectomy of the left lower extremity with balloon angioplasty of the left femoral-popliteal vein on 03/30/2020.  He remains on Eliquis.  States he recently went to [employer] and had pretty progressive swelling after he walked around for a week and a half but since returning home this has improved.  He is wearing a thigh-high compression stocking and elevating his legs.  States has been to hematology and per notes felt to be provoked DVT and no further hypercoagulable work-up was indicated.  Was told 3 to 6 months of anticoagulation.  Past Medical History:  Diagnosis Date  . Allergy   . DVT (deep venous thrombosis) ([place])     Past Surgical History:  Procedure Laterality Date  . INTRAVASCULAR ULTRASOUND/IVUS N/A 03/30/2020   Procedure: INTRAVASCULAR ULTRASOUND/IVUS;  Surgeon: Marty Heck, MD;  Location: [facility];  Service: Cardiovascular;  Laterality: N/A;  . IVC VENOGRAPHY N/A 03/30/2020   Procedure: IVC Venography;  Surgeon: Marty Heck, MD;  Location: [facility];  Service: Cardiovascular;  Laterality: N/A;  . KNEE SURGERY Right   . LEG SURGERY     Reports history of right lower extremity tibia fracture in 2010 which was repaired surgically.  . LOWER EXTREMITY VENOGRAPHY Left 03/30/2020   Procedure: LOWER EXTREMITY VENOGRAPHY;  Surgeon: Marty Heck, MD;  Location: [facility];  Service: Cardiovascular;  Laterality: Left;  . PERIPHERAL VASCULAR THROMBECTOMY Left 03/30/2020   Procedure: PERIPHERAL VASCULAR THROMBECTOMY;  Surgeon: Marty Heck, MD;  Location: [facility];  Service: Cardiovascular;  Laterality: Left;    Family History  Adopted: Yes    SOCIAL HISTORY: Social History   Tobacco Use    . Smoking status: Former Smoker    Types: E-cigarettes, Cigarettes  . Smokeless tobacco: Never Used  Substance Use Topics  . Alcohol use: Not Currently    No Known Allergies  Current Outpatient Medications  Medication Sig Dispense Refill  . apixaban (ELIQUIS) 5 MG TABS tablet Take 1 tablet (5 mg total) by mouth 2 (two) times daily. 60 tablet 11  . aspirin EC 81 MG EC tablet Take 1 tablet (81 mg total) by mouth daily. 30 tablet 11  . cetirizine (ZYRTEC) 10 MG tablet Take 1 tablet by mouth daily as needed for allergies.      No current facility-administered medications for this visit.    REVIEW OF SYSTEMS:  [X]  denotes positive finding, [ ]  denotes negative finding Cardiac  Comments:  Chest pain or chest pressure:    Shortness of breath upon exertion:    Short of breath when lying flat:    Irregular heart rhythm:        Vascular    Pain in calf, thigh, or hip brought on by ambulation:    Pain in feet at night that wakes you up from your sleep:     Blood clot in your veins:    Leg swelling:  x Left - mild      Pulmonary    Oxygen at home:    Productive cough:     Wheezing:         Neurologic    Sudden weakness in arms or legs:     Sudden numbness in arms or legs:  Sudden onset of difficulty speaking or slurred speech:    Temporary loss of vision in one eye:     Problems with dizziness:         Gastrointestinal    Blood in stool:     Vomited blood:         Genitourinary    Burning when urinating:     Blood in urine:        Psychiatric    Major depression:         Hematologic    Bleeding problems:    Problems with blood clotting too easily:        Skin    Rashes or ulcers:        Constitutional    Fever or chills:      PHYSICAL EXAM: Vitals:   05/03/20 1602  BP: 114/76  Pulse: 82  Resp: 14  Temp: (!) 97.5 F (36.4 C)  TempSrc: Temporal  SpO2: 100%  Weight: 292 lb (132.5 kg)  Height: 6\' 4"  (1.93 m)    GENERAL: The patient is a  well-nourished male, in no acute distress. The vital signs are documented above. CARDIAC: There is a regular rate and rhythm.  VASCULAR:  No signs of left lower extremity phlegmasia. Swelling in left leg improved.  DATA:   Left lower extremity venous duplex shows chronic appearing thrombus and flow channel in the femoral-popliteal segment.  Assessment/Plan:  29 year old male presents for follow-up after left lower extremity percutaneous mechanical thrombectomy and angioplasty of the femoral-popliteal vein on 03/30/2020.  Overall think his leg looks better today.  Discussed that most of this was chronic thrombus and there would be no plans for further surgical intervention at this time.  He will need 74-month anticoagulation from my standpoint and appreciate heme-onc for further evaluation.  I will see him in 5 months once he completes 6 months anticoagulation for another duplex and then we can decide to take him off anticoagulation.   Marty Heck, MD [facility] Office: [phone]

## 2020-05-06 ENCOUNTER — Other Ambulatory Visit: Payer: Self-pay | Admitting: *Deleted

## 2020-05-06 DIAGNOSIS — I824Z2 Acute embolism and thrombosis of unspecified deep veins of left distal lower extremity: Secondary | ICD-10-CM

## 2020-06-14 ENCOUNTER — Other Ambulatory Visit: Payer: Self-pay | Admitting: Hematology and Oncology

## 2020-06-14 DIAGNOSIS — I824Z2 Acute embolism and thrombosis of unspecified deep veins of left distal lower extremity: Secondary | ICD-10-CM

## 2020-06-14 NOTE — Progress Notes (Signed)
[place] [phone]   [phone]) [phone]  PROGRESS NOTE  Patient Care Team: Patient, No Pcp Per as PCP - General (General Practice)  Hematological/Oncological History # Extensive Left Lower Extremity DVT, Provoked 1) 03/16/2020: presented to the [facility] ED with leg pain/swelling. [place] LE showed acute deep vein thrombosis involving the left common femoral vein, left femoral vein, and left popliteal vein. Started on anticoagulation therapy. Admitted 4/21-4/24/2021. D/c on Xarelto 2) 03/17/2020: CTA shows no evidence of PE 3) 03/30/2020: patient presented to ED with worsening pain. Admitted again and underwent peripheral vascular thrombectomy. Transitioned to apixaban 4) 04/11/2020: establish care with Dr. Lorenso Courier   Interval History:  Noah Moore 29 y.o. male with medical history significant for extensive provoked LLE DVT who presents for a follow up visit. The patient's last visit was on 04/11/2020 at which time he established care. In the interim since the last visit Noah Moore was seen by Dr. Carlis Abbott on 05/03/2020, who recommended x 6 months duration of anticoagulation.   On exam today Noah Moore notes that he feels much improved compared to his last visit 3 months ago.  He notes that he has been tolerating Xarelto well with no nosebleeds, bruising, or dark stools.  He does occasionally have bright red blood per rectum and most recently had this occur about 1 week ago.  He has known history of constipation and has been taking his stool softeners but reports that about 1 week ago he had a very firm stool which took several attempts to pass.  He notes on the third attempt there was "quite a lot of blood".  He notes that is bright red in nature and it was not painful.  He notes that these episodes of bleeding only occur with constipation.  On further discussion he notes that his energy levels have improved and he has no limitations in his physical activity.  He notes  that he is able to run walk, and do other outdoor activities without any difficulties.  He currently denies having any fevers, chills, sweats, nausea, vomiting or diarrhea.  A full 10 point ROS is listed below.  MEDICAL HISTORY:  Past Medical History:  Diagnosis Date   Allergy    DVT (deep venous thrombosis) ([place])     SURGICAL HISTORY: Past Surgical History:  Procedure Laterality Date   INTRAVASCULAR ULTRASOUND/IVUS N/A 03/30/2020   Procedure: INTRAVASCULAR ULTRASOUND/IVUS;  Surgeon: Marty Heck, MD;  Location: [place] [facility];  Service: Cardiovascular;  Laterality: N/A;   IVC VENOGRAPHY N/A 03/30/2020   Procedure: IVC Venography;  Surgeon: Marty Heck, MD;  Location: [place] [facility];  Service: Cardiovascular;  Laterality: N/A;   KNEE SURGERY Right    LEG SURGERY     Reports history of right lower extremity tibia fracture in 2010 which was repaired surgically.   LOWER EXTREMITY VENOGRAPHY Left 03/30/2020   Procedure: LOWER EXTREMITY VENOGRAPHY;  Surgeon: Marty Heck, MD;  Location: [place] [facility];  Service: Cardiovascular;  Laterality: Left;   PERIPHERAL VASCULAR THROMBECTOMY Left 03/30/2020   Procedure: PERIPHERAL VASCULAR THROMBECTOMY;  Surgeon: Marty Heck, MD;  Location: [place] [facility];  Service: Cardiovascular;  Laterality: Left;    SOCIAL HISTORY: Social History   Socioeconomic History   Marital status: Single    Spouse name: Not on file   Number of children: Not on file   Years of education: Not on file   Highest education level: Not on file  Occupational History  Not on file  Tobacco Use   Smoking status: Former Smoker    Types: E-cigarettes, Cigarettes   Smokeless tobacco: Never Used  Scientific laboratory technician Use: Former  Substance and Sexual Activity   Alcohol use: Not Currently   Drug use: Never   Sexual activity: Not on file  Other Topics Concern   Not on file  Social History Narrative   Not on  file   Social Determinants of Health   Financial Resource Strain:    Difficulty of Paying Living Expenses:   Food Insecurity:    Worried About Charity fundraiser in the Last Year:    Arboriculturist in the Last Year:   Transportation Needs:    Film/video editor (Medical):    Lack of Transportation (Non-Medical):   Physical Activity:    Days of Exercise per Week:    Minutes of Exercise per Session:   Stress:    Feeling of Stress :   Social Connections:    Frequency of Communication with Friends and Family:    Frequency of Social Gatherings with Friends and Family:    Attends Religious Services:    Active Member of Clubs or Organizations:    Attends Music therapist:    Marital Status:   Intimate Partner Violence:    Fear of Current or Ex-Partner:    Emotionally Abused:    Physically Abused:    Sexually Abused:     FAMILY HISTORY: Family History  Adopted: Yes    ALLERGIES:  has No Known Allergies.  MEDICATIONS:  Current Outpatient Medications  Medication Sig Dispense Refill   apixaban (ELIQUIS) 5 MG TABS tablet Take 1 tablet (5 mg total) by mouth 2 (two) times daily. 180 tablet 0   aspirin EC 81 MG EC tablet Take 1 tablet (81 mg total) by mouth daily. 30 tablet 11   cetirizine (ZYRTEC) 10 MG tablet Take 1 tablet by mouth daily as needed for allergies.      No current facility-administered medications for this visit.    REVIEW OF SYSTEMS:   Constitutional: ( - ) fevers, ( - )  chills , ( - ) night sweats Eyes: ( - ) blurriness of vision, ( - ) double vision, ( - ) watery eyes Ears, nose, mouth, throat, and face: ( - ) mucositis, ( - ) sore throat Respiratory: ( - ) cough, ( - ) dyspnea, ( - ) wheezes Cardiovascular: ( - ) palpitation, ( - ) chest discomfort, ( - ) lower extremity swelling Gastrointestinal:  ( - ) nausea, ( - ) heartburn, ( - ) change in bowel habits Skin: ( - ) abnormal skin rashes Lymphatics: ( - ) new  lymphadenopathy, ( - ) easy bruising Neurological: ( - ) numbness, ( - ) tingling, ( - ) new weaknesses Behavioral/Psych: ( - ) mood change, ( - ) new changes  All other systems were reviewed with the patient and are negative.  PHYSICAL EXAMINATION: ECOG PERFORMANCE STATUS: 0 - Asymptomatic  Vitals:   06/15/20 0830  BP: 127/83  Pulse: 100  Resp: 18  Temp: 99.1 F (37.3 C)  SpO2: 99%   Filed Weights   06/15/20 0830  Weight: 293 lb 3.2 oz (133 kg)    GENERAL: well appearing young male,  alert, no distress and comfortable SKIN: skin color, texture, turgor are normal, no rashes or significant lesions EYES: conjunctiva are pink and non-injected, sclera clear LUNGS: clear to auscultation and percussion  with normal breathing effort HEART: regular rate & rhythm and no murmurs and no lower extremity edema Musculoskeletal: no cyanosis of digits and no clubbing  PSYCH: alert & oriented x 3, fluent speech NEURO: no focal motor/sensory deficits  LABORATORY DATA:  I have reviewed the data as listed CBC Latest Ref Rng & Units 06/15/2020 04/11/2020 03/30/2020  WBC 4.0 - 10.5 K/uL 9.6 10.7(H) -  Hemoglobin 13.0 - 17.0 g/dL 13.9 12.2(L) 12.9(L)  Hematocrit 39 - 52 % 43.4 39.0 38.0(L)  Platelets 150 - 400 K/uL 316 439(H) -    CMP Latest Ref Rng & Units 06/15/2020 04/11/2020 03/30/2020  Glucose 70 - 99 mg/dL 93 85 87  BUN 6 - 20 mg/dL 5(L) 7 5(L)  Creatinine 0.61 - 1.24 mg/dL 0.98 0.85 0.70  Sodium 135 - 145 mmol/L 139 141 140  Potassium 3.5 - 5.1 mmol/L 4.0 4.1 4.1  Chloride 98 - 111 mmol/L 105 104 104  CO2 22 - 32 mmol/L 25 27 -  Calcium 8.9 - 10.3 mg/dL 9.8 9.3 -  Total Protein 6.5 - 8.1 g/dL 8.4(H) 8.2(H) -  Total Bilirubin 0.3 - 1.2 mg/dL 0.5 0.5 -  Alkaline Phos 38 - 126 U/L 115 119 -  AST 15 - 41 U/L 22 17 -  ALT 0 - 44 U/L 40 44 -    RADIOGRAPHIC STUDIES: No results found.  ASSESSMENT & PLAN Noah Moore 29 y.o. male with medical history significant for extensive provoked  LLE DVT who presents for a follow up visit.  After review the labs, discussion with the patient, and review of the prior records the patient's findings are most consistent with a provoked left lower extremity DVT.  The patient was seen by vascular surgery on 05/03/2020 at which time it was recommended that he continue anticoagulation for a full 6 months duration.  The patient on his visit today marks the third month of duration of therapy, so we will plan for an additional 3 months of of apixaban.  We will see the patient back in October 2021 in order to determine if he can stop anticoagulation therapy altogether.  # Extensive Left Lower Extremity DVT, Provoked --findings are most consistent with a provoked DVT in the setting of prolong car travel (5.5 hours to [place], [place]). Given this I would recommend anticoagulation therapy for 3-6 months. --given the provoked nature of this clot a full hypercoagulation workup is not merited.  --continue eliquis 5mg  BID as prescribed. Currently intend to continue this for a full 6 month course.  --continue to monitor rectal bleeding (detailed below) --f/u in 3 months time to assess at the 6 month mark to see if further therapy is required. Vascular surgery recommends a full 6 months of therapy.   #Rectal Bleeding --findings are most consistent with internal hemorrhoids developing due to opioid induced constipation --today will check iron panel, ferritin, retic panel, and Hgb to assure the patient's blood counts are stable.  --strict return precautions for lightheadedness, shortness of breath, or uncontrollable bleeding.  --if hemorrhoid regimen does not work (stool softeners, prn miralax) to help resolve his symptoms can consider referral to colorectal surgery to assure hemorrhoids are the cause and determine what intervention can be undertaken. --continue to monitor.   No orders of the defined types were placed in this encounter.   All questions were  answered. The patient knows to call the clinic with any problems, questions or concerns.  A total of more than 30 minutes were spent on this encounter  and over half of that time was spent on counseling and coordination of care as outlined above.   Ledell Peoples, MD Department of Hematology/Oncology [place] [facility] Phone: [phone] Pager: [phone] Email: [email or web address]  06/15/2020 10:08 AM

## 2020-06-15 ENCOUNTER — Inpatient Hospital Stay: Payer: 59

## 2020-06-15 ENCOUNTER — Encounter: Payer: Self-pay | Admitting: Hematology and Oncology

## 2020-06-15 ENCOUNTER — Other Ambulatory Visit: Payer: Self-pay

## 2020-06-15 ENCOUNTER — Inpatient Hospital Stay: Payer: 59 | Attending: Hematology and Oncology | Admitting: Hematology and Oncology

## 2020-06-15 VITALS — BP 127/83 | HR 100 | Temp 99.1°F | Resp 18 | Wt 293.2 lb

## 2020-06-15 DIAGNOSIS — K625 Hemorrhage of anus and rectum: Secondary | ICD-10-CM | POA: Insufficient documentation

## 2020-06-15 DIAGNOSIS — Z86718 Personal history of other venous thrombosis and embolism: Secondary | ICD-10-CM | POA: Insufficient documentation

## 2020-06-15 DIAGNOSIS — Z87891 Personal history of nicotine dependence: Secondary | ICD-10-CM | POA: Insufficient documentation

## 2020-06-15 DIAGNOSIS — Z7901 Long term (current) use of anticoagulants: Secondary | ICD-10-CM | POA: Diagnosis not present

## 2020-06-15 DIAGNOSIS — K648 Other hemorrhoids: Secondary | ICD-10-CM | POA: Insufficient documentation

## 2020-06-15 DIAGNOSIS — Z79899 Other long term (current) drug therapy: Secondary | ICD-10-CM | POA: Diagnosis not present

## 2020-06-15 DIAGNOSIS — I824Z2 Acute embolism and thrombosis of unspecified deep veins of left distal lower extremity: Secondary | ICD-10-CM

## 2020-06-15 DIAGNOSIS — I82412 Acute embolism and thrombosis of left femoral vein: Secondary | ICD-10-CM | POA: Insufficient documentation

## 2020-06-15 DIAGNOSIS — K5900 Constipation, unspecified: Secondary | ICD-10-CM | POA: Diagnosis not present

## 2020-06-15 LAB — IRON AND TIBC
Iron: 19 ug/dL — ABNORMAL LOW (ref 42–163)
Saturation Ratios: 7 % — ABNORMAL LOW (ref 20–55)
TIBC: 281 ug/dL (ref 202–409)
UIBC: 262 ug/dL (ref 117–376)

## 2020-06-15 LAB — CBC WITH DIFFERENTIAL (CANCER CENTER ONLY)
Abs Immature Granulocytes: 0.03 10*3/uL (ref 0.00–0.07)
Basophils Absolute: 0 10*3/uL (ref 0.0–0.1)
Basophils Relative: 0 %
Eosinophils Absolute: 0.1 10*3/uL (ref 0.0–0.5)
Eosinophils Relative: 1 %
HCT: 43.4 % (ref 39.0–52.0)
Hemoglobin: 13.9 g/dL (ref 13.0–17.0)
Immature Granulocytes: 0 %
Lymphocytes Relative: 19 %
Lymphs Abs: 1.8 10*3/uL (ref 0.7–4.0)
MCH: 28 pg (ref 26.0–34.0)
MCHC: 32 g/dL (ref 30.0–36.0)
MCV: 87.5 fL (ref 80.0–100.0)
Monocytes Absolute: 1.2 10*3/uL — ABNORMAL HIGH (ref 0.1–1.0)
Monocytes Relative: 12 %
Neutro Abs: 6.5 10*3/uL (ref 1.7–7.7)
Neutrophils Relative %: 68 %
Platelet Count: 316 10*3/uL (ref 150–400)
RBC: 4.96 MIL/uL (ref 4.22–5.81)
RDW: 13.4 % (ref 11.5–15.5)
WBC Count: 9.6 10*3/uL (ref 4.0–10.5)
nRBC: 0 % (ref 0.0–0.2)

## 2020-06-15 LAB — CMP (CANCER CENTER ONLY)
ALT: 40 U/L (ref 0–44)
AST: 22 U/L (ref 15–41)
Albumin: 3.6 g/dL (ref 3.5–5.0)
Alkaline Phosphatase: 115 U/L (ref 38–126)
Anion gap: 9 (ref 5–15)
BUN: 5 mg/dL — ABNORMAL LOW (ref 6–20)
CO2: 25 mmol/L (ref 22–32)
Calcium: 9.8 mg/dL (ref 8.9–10.3)
Chloride: 105 mmol/L (ref 98–111)
Creatinine: 0.98 mg/dL (ref 0.61–1.24)
GFR, Est AFR Am: >60 mL/min (ref >60)
GFR, Estimated: >60 mL/min (ref >60)
Glucose, Bld: 93 mg/dL (ref 70–99)
Potassium: 4 mmol/L (ref 3.5–5.1)
Sodium: 139 mmol/L (ref 135–145)
Total Bilirubin: 0.5 mg/dL (ref 0.3–1.2)
Total Protein: 8.4 g/dL — ABNORMAL HIGH (ref 6.5–8.1)

## 2020-06-15 LAB — RETIC PANEL
Immature Retic Fract: 4.9 % (ref 2.3–15.9)
RBC.: 4.88 MIL/uL (ref 4.22–5.81)
Retic Count, Absolute: 28.3 10*3/uL (ref 19.0–186.0)
Retic Ct Pct: 0.6 % (ref 0.4–3.1)
Reticulocyte Hemoglobin: 31.3 pg (ref >27.9)

## 2020-06-15 LAB — FERRITIN: Ferritin: 148 ng/mL (ref 24–336)

## 2020-06-15 MED ORDER — APIXABAN 5 MG PO TABS
5.0000 mg | ORAL_TABLET | Freq: Two times a day (BID) | ORAL | 0 refills | Status: DC
Start: 2020-06-15 — End: 2020-06-29

## 2020-06-16 ENCOUNTER — Telehealth: Payer: Self-pay | Admitting: Hematology and Oncology

## 2020-06-16 NOTE — Telephone Encounter (Signed)
Scheduled per los. Called and left msg. Mailed printout  °

## 2020-06-17 ENCOUNTER — Telehealth: Payer: Self-pay | Admitting: *Deleted

## 2020-06-17 NOTE — Telephone Encounter (Signed)
Received call back from pt. Reviewed labs with and advised that he does not need oral iron at this time. Advised to increase his intake of iron rich foods. Advised that he has a follow up appt with [place] in October. Provided date and time. Noah Moore voiced understanding

## 2020-06-17 NOTE — Telephone Encounter (Signed)
TCT patient regarding lab results.  No answer but was able to leave vm message for pt to return this call at his earliest convenience @ [phone]

## 2020-06-17 NOTE — Telephone Encounter (Signed)
-----   Message from Orson Slick, MD sent at 06/16/2020 10:14 AM EDT ----- Please let Mr. Noah Moore know that his Hgb is normal and that his iron levels are mildly low. He does not require iron pills at this time, but I would recommend eating iron rich foods. We will see him back in 3 months time to discuss stopping the bloodthinner. ----- Message ----- From: Buel Ream, Lab In [place] Sent: 06/15/2020   8:27 AM EDT To: Orson Slick, MD

## 2020-06-21 ENCOUNTER — Other Ambulatory Visit: Payer: Self-pay

## 2020-06-21 ENCOUNTER — Inpatient Hospital Stay (HOSPITAL_BASED_OUTPATIENT_CLINIC_OR_DEPARTMENT_OTHER)
Admission: EM | Admit: 2020-06-21 | Discharge: 2020-06-26 | DRG: 271 | Disposition: A | Discharge status: Home / Self Care | Payer: 59 | Attending: Internal Medicine | Admitting: Internal Medicine

## 2020-06-21 ENCOUNTER — Emergency Department (HOSPITAL_BASED_OUTPATIENT_CLINIC_OR_DEPARTMENT_OTHER): Payer: 59

## 2020-06-21 ENCOUNTER — Encounter (HOSPITAL_BASED_OUTPATIENT_CLINIC_OR_DEPARTMENT_OTHER): Payer: Self-pay | Admitting: Radiology

## 2020-06-21 DIAGNOSIS — Z20822 Contact with and (suspected) exposure to covid-19: Secondary | ICD-10-CM | POA: Diagnosis present

## 2020-06-21 DIAGNOSIS — I809 Phlebitis and thrombophlebitis of unspecified site: Secondary | ICD-10-CM | POA: Diagnosis not present

## 2020-06-21 DIAGNOSIS — I82422 Acute embolism and thrombosis of left iliac vein: Secondary | ICD-10-CM | POA: Diagnosis not present

## 2020-06-21 DIAGNOSIS — I82412 Acute embolism and thrombosis of left femoral vein: Secondary | ICD-10-CM | POA: Diagnosis present

## 2020-06-21 DIAGNOSIS — I82220 Acute embolism and thrombosis of inferior vena cava: Secondary | ICD-10-CM | POA: Diagnosis not present

## 2020-06-21 DIAGNOSIS — Z7901 Long term (current) use of anticoagulants: Secondary | ICD-10-CM

## 2020-06-21 DIAGNOSIS — I82409 Acute embolism and thrombosis of unspecified deep veins of unspecified lower extremity: Secondary | ICD-10-CM | POA: Diagnosis present

## 2020-06-21 DIAGNOSIS — K648 Other hemorrhoids: Secondary | ICD-10-CM | POA: Diagnosis present

## 2020-06-21 DIAGNOSIS — Z86718 Personal history of other venous thrombosis and embolism: Secondary | ICD-10-CM

## 2020-06-21 DIAGNOSIS — Z87891 Personal history of nicotine dependence: Secondary | ICD-10-CM

## 2020-06-21 LAB — CBC WITH DIFFERENTIAL/PLATELET
Abs Immature Granulocytes: 0.07 10*3/uL (ref 0.00–0.07)
Basophils Absolute: 0.1 10*3/uL (ref 0.0–0.1)
Basophils Relative: 0 %
Eosinophils Absolute: 0.1 10*3/uL (ref 0.0–0.5)
Eosinophils Relative: 0 %
HCT: 41.9 % (ref 39.0–52.0)
Hemoglobin: 13.5 g/dL (ref 13.0–17.0)
Immature Granulocytes: 1 %
Lymphocytes Relative: 16 %
Lymphs Abs: 2.5 10*3/uL (ref 0.7–4.0)
MCH: 28.4 pg (ref 26.0–34.0)
MCHC: 32.2 g/dL (ref 30.0–36.0)
MCV: 88.2 fL (ref 80.0–100.0)
Monocytes Absolute: 1.5 10*3/uL — ABNORMAL HIGH (ref 0.1–1.0)
Monocytes Relative: 10 %
Neutro Abs: 10.8 10*3/uL — ABNORMAL HIGH (ref 1.7–7.7)
Neutrophils Relative %: 73 %
Platelets: 410 10*3/uL — ABNORMAL HIGH (ref 150–400)
RBC: 4.75 MIL/uL (ref 4.22–5.81)
RDW: 13.5 % (ref 11.5–15.5)
WBC: 15 10*3/uL — ABNORMAL HIGH (ref 4.0–10.5)
nRBC: 0 % (ref 0.0–0.2)

## 2020-06-21 LAB — URINALYSIS, ROUTINE W REFLEX MICROSCOPIC
Bilirubin Urine: NEGATIVE
Glucose, UA: NEGATIVE mg/dL
Ketones, ur: NEGATIVE mg/dL
Leukocytes,Ua: NEGATIVE
Nitrite: NEGATIVE
Protein, ur: NEGATIVE mg/dL
Specific Gravity, Urine: <1.005 — ABNORMAL LOW (ref 1.005–1.030)
pH: 6 (ref 5.0–8.0)

## 2020-06-21 LAB — URINALYSIS, MICROSCOPIC (REFLEX)

## 2020-06-21 LAB — COMPREHENSIVE METABOLIC PANEL
ALT: 33 U/L (ref 0–44)
AST: 20 U/L (ref 15–41)
Albumin: 3.8 g/dL (ref 3.5–5.0)
Alkaline Phosphatase: 123 U/L (ref 38–126)
Anion gap: 13 (ref 5–15)
BUN: 5 mg/dL — ABNORMAL LOW (ref 6–20)
CO2: 24 mmol/L (ref 22–32)
Calcium: 8.8 mg/dL — ABNORMAL LOW (ref 8.9–10.3)
Chloride: 98 mmol/L (ref 98–111)
Creatinine, Ser: 0.93 mg/dL (ref 0.61–1.24)
GFR calc Af Amer: >60 mL/min (ref >60)
GFR calc non Af Amer: >60 mL/min (ref >60)
Glucose, Bld: 94 mg/dL (ref 70–99)
Potassium: 3.8 mmol/L (ref 3.5–5.1)
Sodium: 135 mmol/L (ref 135–145)
Total Bilirubin: 0.6 mg/dL (ref 0.3–1.2)
Total Protein: 8.7 g/dL — ABNORMAL HIGH (ref 6.5–8.1)

## 2020-06-21 LAB — LIPASE, BLOOD: Lipase: 24 U/L (ref 11–51)

## 2020-06-21 MED ORDER — ONDANSETRON HCL 4 MG/2ML IJ SOLN
4.0000 mg | Freq: Once | INTRAMUSCULAR | Status: AC
  Administered 2020-06-21: 4 mg via INTRAVENOUS
  Filled 2020-06-21: qty 2

## 2020-06-21 MED ORDER — IOHEXOL 300 MG/ML  SOLN
100.0000 mL | Freq: Once | INTRAMUSCULAR | Status: AC | PRN
  Administered 2020-06-21: 100 mL via INTRAVENOUS

## 2020-06-21 MED ORDER — MORPHINE SULFATE (PF) 4 MG/ML IV SOLN
4.0000 mg | Freq: Once | INTRAVENOUS | Status: AC
  Administered 2020-06-21: 4 mg via INTRAVENOUS
  Filled 2020-06-21: qty 1

## 2020-06-21 MED ORDER — HEPARIN (PORCINE) 25000 UT/250ML-% IV SOLN
2400.0000 [IU]/h | INTRAVENOUS | Status: DC
  Administered 2020-06-21: 1900 [IU]/h via INTRAVENOUS
  Administered 2020-06-22 (×2): 2200 [IU]/h via INTRAVENOUS
  Administered 2020-06-23: 2400 [IU]/h via INTRAVENOUS
  Filled 2020-06-21 (×4): qty 250

## 2020-06-21 MED ORDER — SODIUM CHLORIDE 0.9 % IV BOLUS
1000.0000 mL | Freq: Once | INTRAVENOUS | Status: AC
  Administered 2020-06-21: 1000 mL via INTRAVENOUS

## 2020-06-21 MED ORDER — HEPARIN BOLUS VIA INFUSION
5000.0000 [IU] | Freq: Once | INTRAVENOUS | Status: AC
  Administered 2020-06-21: 5000 [IU] via INTRAVENOUS

## 2020-06-21 NOTE — ED Triage Notes (Signed)
Pain in his lower abdomen and upper legs. He was seen for same at [facility] yesterday and had a normal exam. He is taking Eloquis  for hx of DVT.

## 2020-06-21 NOTE — ED Provider Notes (Signed)
[place] EMERGENCY DEPARTMENT Provider Note   CSN: [id number] Arrival date & time: 06/21/20  1818     History Chief Complaint  Patient presents with   Abdominal Pain    Noah Moore is a 29 y.o. male.  Patient is a 29 year old male with history of DVT presenting with complaints of lower abdominal pain. This has been present for the past 5 days. He was seen at urgent care yesterday and had a urinalysis and x-rays performed, both of which were unremarkable. He describes ongoing pain in the suprapubic and left lower quadrant. It is worse when he moves and presses on the area. He denies any bowel or bladder complaints. He has felt feverish but has not taken his temperature.  Patient also has history of DVT in the left leg. He is currently taking Eliquis. He tells me that his leg feels "sluggish" and is concerned he may be forming another clot. He denies any chest pain or difficulty breathing.  The history is provided by the patient.  Abdominal Pain Pain location:  Suprapubic and LLQ Pain quality: stabbing   Pain radiates to:  L flank Pain severity:  Moderate Onset quality:  Sudden Duration:  5 days Timing:  Constant Progression:  Worsening Chronicity:  New      No past medical history on file.  There are no problems to display for this patient.      No family history on file.  Social History   Tobacco Use   Smoking status: Not on file  Substance Use Topics   Alcohol use: Not on file   Drug use: Not on file    Home Medications Prior to Admission medications   Not on File    Allergies    Patient has no known allergies.  Review of Systems   Review of Systems  Gastrointestinal: Positive for abdominal pain.  All other systems reviewed and are negative.   Physical Exam Updated Vital Signs BP 120/78    Pulse (!) 127    Temp 99.3 F (37.4 C) (Oral)    Resp 20    Ht 6\' 4"  (1.93 m)    Wt (!) 131.5 kg    SpO2 99%    BMI 35.30 kg/m    Physical Exam Vitals and nursing note reviewed.  Constitutional:      General: He is not in acute distress.    Appearance: He is well-developed. He is not diaphoretic.  HENT:     Head: Normocephalic and atraumatic.  Cardiovascular:     Rate and Rhythm: Normal rate and regular rhythm.     Heart sounds: No murmur heard.  No friction rub.  Pulmonary:     Effort: Pulmonary effort is normal. No respiratory distress.     Breath sounds: Normal breath sounds. No wheezing or rales.  Abdominal:     General: Bowel sounds are normal. There is no distension.     Palpations: Abdomen is soft.     Tenderness: There is abdominal tenderness in the suprapubic area and left lower quadrant. There is no right CVA tenderness, left CVA tenderness, guarding or rebound.  Genitourinary:    Testes:        Right: Mass, tenderness or swelling not present.        Left: Mass, tenderness or swelling not present.  Musculoskeletal:        General: Normal range of motion.     Cervical back: Normal range of motion and neck supple.  Skin:  General: Skin is warm and dry.  Neurological:     Mental Status: He is alert and oriented to person, place, and time.     Coordination: Coordination normal.     ED Results / Procedures / Treatments   Labs (all labs ordered are listed, but only abnormal results are displayed) Labs Reviewed  COMPREHENSIVE METABOLIC PANEL  CBC WITH DIFFERENTIAL/PLATELET  LIPASE, BLOOD  URINALYSIS, ROUTINE W REFLEX MICROSCOPIC    EKG None  Radiology No results found.  Procedures Procedures (including critical care time)  Medications Ordered in ED Medications  sodium chloride 0.9 % bolus 1,000 mL (has no administration in time range)    ED Course  I have reviewed the triage vital signs and the nursing notes.  Pertinent labs & imaging results that were available during my care of the patient were reviewed by me and considered in my medical decision making (see chart for  details).    MDM Rules/Calculators/A&P  Patient is a 29 year old male with history of prior DVT to the left lower extremity.  This required thrombectomy and patient has been taking Eliquis.  He returns with left lower quadrant abdominal pain and a sensation in his left leg that feels as though the DVT is back.  Patient's work-up today shows a leukocytosis with white count of 15,000.  He does have some tenderness in the left lower quadrant.  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  This finding was discussed with Dr. Scot Dock from vascular surgery.  He is recommending admission to the hospitalist service for anticoagulation and possible thrombectomy.  I have spoken with the hospitalist who agrees to admit.  IV heparin initiated.  CRITICAL CARE Performed by: Veryl Speak Total critical care time: 35 minutes Critical care time was exclusive of separately billable procedures and treating other patients. Critical care was necessary to treat or prevent imminent or life-threatening deterioration. Critical care was time spent personally by me on the following activities: development of treatment plan with patient and/or surrogate as well as nursing, discussions with consultants, evaluation of patient's response to treatment, examination of patient, obtaining history from patient or surrogate, ordering and performing treatments and interventions, ordering and review of laboratory studies, ordering and review of radiographic studies, pulse oximetry and re-evaluation of patient's condition.   Final Clinical Impression(s) / ED Diagnoses Final diagnoses:  None    Rx / DC Orders ED Discharge Orders    None       Veryl Speak, MD 06/22/20 1535

## 2020-06-21 NOTE — Progress Notes (Signed)
[place] for Heparin Indication: DVT  No Known Allergies  Patient Measurements: Height: 6\' 4"  (193 cm) Weight: (!) 131.5 kg (290 lb) IBW/kg (Calculated) : 86.8 Heparin Dosing Weight: 115.4 kg  Vital Signs: Temp: 99.3 F (37.4 C) (07/27 1932) Temp Source: Oral (07/27 1932) BP: 112/68 (07/27 2214) Pulse Rate: 92 (07/27 2214)  Labs: Recent Labs    06/21/20 2120  HGB 13.5  HCT 41.9  PLT 410*  CREATININE 0.93    Estimated Creatinine Clearance: 176.7 mL/min (by C-G formula based on SCr of 0.93 mg/dL).   Medical History: History reviewed. No pertinent past medical history.  Medications:  Scheduled:  . heparin  5,000 Units Intravenous Once    Assessment: Patient is a 40 yom that presents to the ED with leg and abd pain. The patient is on Apixaban for previous DVT at home. However it appears that the patient has developed an extensive DVT while on Apixaban. Pharmacy has been asked to dose heparin for this DVT.   Last dose of Apixaban was at 0900 on 7/27 Goal of Therapy:  aPTT 66-102 seconds Monitor platelets by anticoagulation protocol: Yes   Plan:  -  Will bolus despite patient being on Apixaban PTA and last dose being at 0900 on 7/27 due to extensive DVT and occurring while on Apixaban. Will however do a reduced bolus - Heparin 5000 units IV x 1 dose  - Followed by heparin drip @ 1900 units/hr  - Will monitor heparin using aPTT until aPTT and Heparin levels correlate - aPTT level in ~ 6 hours  - Monitor patient for s/s of bleeding and CBC while on heparin   Duanne Limerick PharmD. BCPS  06/21/2020,10:53 PM

## 2020-06-21 NOTE — ED Notes (Signed)
PT to CT.

## 2020-06-22 ENCOUNTER — Encounter (HOSPITAL_COMMUNITY): Payer: Self-pay | Admitting: Family Medicine

## 2020-06-22 DIAGNOSIS — Z20822 Contact with and (suspected) exposure to covid-19: Secondary | ICD-10-CM | POA: Diagnosis present

## 2020-06-22 DIAGNOSIS — I82402 Acute embolism and thrombosis of unspecified deep veins of left lower extremity: Secondary | ICD-10-CM

## 2020-06-22 DIAGNOSIS — I809 Phlebitis and thrombophlebitis of unspecified site: Secondary | ICD-10-CM | POA: Diagnosis present

## 2020-06-22 DIAGNOSIS — Z86718 Personal history of other venous thrombosis and embolism: Secondary | ICD-10-CM | POA: Diagnosis not present

## 2020-06-22 DIAGNOSIS — I82220 Acute embolism and thrombosis of inferior vena cava: Secondary | ICD-10-CM | POA: Diagnosis present

## 2020-06-22 DIAGNOSIS — K648 Other hemorrhoids: Secondary | ICD-10-CM | POA: Diagnosis present

## 2020-06-22 DIAGNOSIS — M7989 Other specified soft tissue disorders: Secondary | ICD-10-CM | POA: Diagnosis not present

## 2020-06-22 DIAGNOSIS — Z87891 Personal history of nicotine dependence: Secondary | ICD-10-CM | POA: Diagnosis not present

## 2020-06-22 DIAGNOSIS — I82422 Acute embolism and thrombosis of left iliac vein: Secondary | ICD-10-CM | POA: Diagnosis present

## 2020-06-22 DIAGNOSIS — Z7901 Long term (current) use of anticoagulants: Secondary | ICD-10-CM | POA: Diagnosis not present

## 2020-06-22 DIAGNOSIS — I82412 Acute embolism and thrombosis of left femoral vein: Secondary | ICD-10-CM | POA: Diagnosis present

## 2020-06-22 LAB — BASIC METABOLIC PANEL
Anion gap: 11 (ref 5–15)
BUN: <5 mg/dL — ABNORMAL LOW (ref 6–20)
CO2: 23 mmol/L (ref 22–32)
Calcium: 8.7 mg/dL — ABNORMAL LOW (ref 8.9–10.3)
Chloride: 102 mmol/L (ref 98–111)
Creatinine, Ser: 0.81 mg/dL (ref 0.61–1.24)
GFR calc Af Amer: >60 mL/min (ref >60)
GFR calc non Af Amer: >60 mL/min (ref >60)
Glucose, Bld: 84 mg/dL (ref 70–99)
Potassium: 3.7 mmol/L (ref 3.5–5.1)
Sodium: 136 mmol/L (ref 135–145)

## 2020-06-22 LAB — HIV ANTIBODY (ROUTINE TESTING W REFLEX): HIV Screen 4th Generation wRfx: NONREACTIVE

## 2020-06-22 LAB — APTT
aPTT: 55 seconds — ABNORMAL HIGH (ref 24–36)
aPTT: 90 seconds — ABNORMAL HIGH (ref 24–36)

## 2020-06-22 LAB — HEPARIN LEVEL (UNFRACTIONATED)
Heparin Unfractionated: 1.7 IU/mL — ABNORMAL HIGH (ref 0.30–0.70)
Heparin Unfractionated: 1.06 IU/mL — ABNORMAL HIGH (ref 0.30–0.70)

## 2020-06-22 LAB — PROTIME-INR
INR: 1.2 (ref 0.8–1.2)
Prothrombin Time: 14.9 seconds (ref 11.4–15.2)

## 2020-06-22 LAB — LACTIC ACID, PLASMA: Lactic Acid, Venous: 1.3 mmol/L (ref 0.5–1.9)

## 2020-06-22 LAB — SARS CORONAVIRUS 2 BY RT PCR (HOSPITAL ORDER, PERFORMED IN ~~LOC~~ HOSPITAL LAB): SARS Coronavirus 2: NEGATIVE

## 2020-06-22 MED ORDER — MORPHINE SULFATE (PF) 4 MG/ML IV SOLN
4.0000 mg | Freq: Once | INTRAVENOUS | Status: AC
  Administered 2020-06-22: 4 mg via INTRAVENOUS
  Filled 2020-06-22: qty 1

## 2020-06-22 MED ORDER — ONDANSETRON HCL 4 MG/2ML IJ SOLN: 4.0000 mg | Freq: Four times a day (QID) | INTRAMUSCULAR | Status: DC | PRN

## 2020-06-22 MED ORDER — SODIUM CHLORIDE 0.9 % IV SOLN
1.0000 g | INTRAVENOUS | Status: DC
  Administered 2020-06-22: 1 g via INTRAVENOUS
  Filled 2020-06-22: qty 10

## 2020-06-22 MED ORDER — HYDROCODONE-ACETAMINOPHEN 5-325 MG PO TABS
1.0000 | ORAL_TABLET | ORAL | Status: DC | PRN
  Administered 2020-06-22 (×2): 2 via ORAL
  Administered 2020-06-23: 1 via ORAL
  Administered 2020-06-23 – 2020-06-26 (×12): 2 via ORAL
  Filled 2020-06-22 (×7): qty 2
  Filled 2020-06-22: qty 1
  Filled 2020-06-22 (×8): qty 2

## 2020-06-22 MED ORDER — METRONIDAZOLE IN NACL 5-0.79 MG/ML-% IV SOLN
500.0000 mg | Freq: Three times a day (TID) | INTRAVENOUS | Status: DC
  Administered 2020-06-22: 500 mg via INTRAVENOUS
  Filled 2020-06-22: qty 100

## 2020-06-22 MED ORDER — BISACODYL 5 MG PO TBEC
5.0000 mg | DELAYED_RELEASE_TABLET | Freq: Every day | ORAL | Status: DC | PRN
  Administered 2020-06-25: 5 mg via ORAL
  Filled 2020-06-22 (×2): qty 1

## 2020-06-22 MED ORDER — SODIUM CHLORIDE 0.9 % IV SOLN: INTRAVENOUS | Status: AC

## 2020-06-22 MED ORDER — SENNOSIDES-DOCUSATE SODIUM 8.6-50 MG PO TABS: 1.0000 | ORAL_TABLET | Freq: Every evening | ORAL | Status: DC | PRN

## 2020-06-22 MED ORDER — ONDANSETRON HCL 4 MG PO TABS: 4.0000 mg | ORAL_TABLET | Freq: Four times a day (QID) | ORAL | Status: DC | PRN

## 2020-06-22 MED ORDER — ACETAMINOPHEN 650 MG RE SUPP: 650.0000 mg | Freq: Four times a day (QID) | RECTAL | Status: DC | PRN

## 2020-06-22 MED ORDER — ACETAMINOPHEN 325 MG PO TABS
650.0000 mg | ORAL_TABLET | Freq: Four times a day (QID) | ORAL | Status: DC | PRN
  Administered 2020-06-23: 650 mg via ORAL
  Filled 2020-06-22: qty 2

## 2020-06-22 MED ORDER — MORPHINE SULFATE (PF) 4 MG/ML IV SOLN
4.0000 mg | INTRAVENOUS | Status: DC | PRN
  Administered 2020-06-22 – 2020-06-25 (×14): 4 mg via INTRAVENOUS
  Filled 2020-06-22 (×14): qty 1

## 2020-06-22 NOTE — Progress Notes (Signed)
ANTICOAGULATION CONSULT NOTE  Pharmacy Consult for Heparin Indication: DVT  No Known Allergies  Patient Measurements: Height: 6\' 4"  (193 cm) Weight: (!) 131.5 kg (290 lb) IBW/kg (Calculated) : 86.8 Heparin Dosing Weight: 115.4 kg  Vital Signs: Temp: 98.4 F (36.9 C) (07/28 1155) Temp Source: Oral (07/28 1155) BP: 110/68 (07/28 1155) Pulse Rate: 79 (07/28 1155)  Labs: Recent Labs    06/21/20 2120 06/21/20 2319 06/22/20 0332 06/22/20 0652 06/22/20 1504  HGB 13.5  --   --   --   --   HCT 41.9  --   --   --   --   PLT 410*  --   --   --   --   APTT  --   --   --  55* 90*  LABPROT  --   --   --   --  14.9  INR  --   --   --   --  1.2  HEPARINUNFRC  --  1.70*  --  1.06*  --   CREATININE 0.93  --  0.81  --   --     Estimated Creatinine Clearance: 202.9 mL/min (by C-G formula based on SCr of 0.81 mg/dL).   Medical History: Past Medical History:  Diagnosis Date  . DVT (deep venous thrombosis) ([place]) 02/2020     Assessment: 75 YOM presenting with leg and abd pain. Patient was originally diagnosed with DVT in April 2021 and started on Xarelto. In May 2021, pt noted increased pain and underwent thrombectomy and was transitioned to Eliquis. Patient reports strict adherence to Eliquis, but CT shows extensive DVT in femoral and iliac veins. Pharmacy consulted to dose heparin.   Last dose of Apixaban PTA at 0900 on 7/27. Baseline Heparin level 1.7, reflecting recent apixaban use. Given elevated baseline heparin level and recent apixaban use, will utilize aPTTs to guide heparin dosing.  APTT now therapeutic after rate increase this AM. Heparin level remained elevated today, as expected. Hg wnl, plt elevated. No active bleed issues reported.  Goal of Therapy:  aPTT 66-102 seconds Heparin level 0.3-0.7 Monitor platelets by anticoagulation protocol: Yes   Plan:  Continue heparin IV at 2200 units/hr  Will monitor heparin using aPTT until aPTT and Heparin level correlate F/u  6hr aPTT to confirm Monitor daily heparin level/aPTT/CBC, s/sx bleeding   Arturo Morton, PharmD, BCPS Please check AMION for all [place] contact numbers Clinical Pharmacist 06/22/2020 3:57 PM

## 2020-06-22 NOTE — Plan of Care (Signed)
  Problem: Clinical Measurements: Goal: Diagnostic test results will improve Outcome: Progressing   Problem: Coping: Goal: Level of anxiety will decrease Outcome: Progressing   Problem: Safety: Goal: Ability to remain free from injury will improve Outcome: Progressing   

## 2020-06-22 NOTE — H&P (Signed)
History and Physical    Noah Moore SWN:[id number] DOB: [birth year] DOA: 06/21/2020  PCP: Patient, No Pcp Per   Patient coming from: Home   Chief Complaint: Lower abdominal and proximal left leg pain, fever/chills   HPI: Noah Moore is a 29 y.o. male with medical history significant for left lower extremity DVT, now presenting to the emergency department for evaluation of lower abdominal and proximal left leg pain, fevers, and chills.  Patient reports strict adherence with his Eliquis but developed pain in the lower abdomen and proximal left leg approximately 5 days ago, has been having subjective fevers/chills since then, was seen on 06/20/2020 for the symptoms and told that he did not have a UTI or kidney stones.  Pain persisted, becoming unbearable, prompting him to present to the ED.  He denies any chest pain or shortness of breath, and denies hemoptysis.  He has not noticed any swelling in the lower extremities.  Historically, the patient was diagnosed with left lower extremity DVT in late April 2021, started on Xarelto, but returned with increasing pain and underwent thrombectomy in early May 2021 and was transitioned to Eliquis.  Patient was seen by hematologist, it was felt that the DVT was provoked by prolonged immobilization in a vehicle, and therefore no further work-up was recommended and the patient was planned to continue anticoagulation for 3 to 6 months.  [facility] ED Course: Upon arrival to the ED, patient is found to be afebrile, saturating well on room air, tachycardic to the 120s, and with stable blood pressure.  Chemistry panel is unremarkable and CBC notable for leukocytosis to 15,000 and thrombocytosis to 410,000.  COVID-19 PCR is negative.  CT the abdomen pelvis is concerning for large DVT extending from left profundus femoral vein to junction of common iliac veins and likely inferior IVC.  CT is also notable for stranding about the left iliac vein which  could reflect thrombophlebitis.  Patient was given a liter of saline, morphine, and Zofran in the emergency department, and was started on IV heparin.  Vascular surgery was consulted by the ED physician.  Hospitalists were asked to admit.  Review of Systems:  All other systems reviewed and apart from HPI, are negative.  Past Medical History:  Diagnosis Date  . DVT (deep venous thrombosis) ([place]) 02/2020    Past Surgical History:  Procedure Laterality Date  . THROMBECTOMY  03/2020    Social History:   reports that he has quit smoking. His smoking use included e-cigarettes. He has never used smokeless tobacco. He reports previous alcohol use. He reports previous drug use.  No Known Allergies  Family History  Problem Relation Age of Onset  . Venous thrombosis Neg Hx      Prior to Admission medications   Not on File    Physical Exam: Vitals:   06/21/20 1932 06/21/20 2214 06/22/20 0020 06/22/20 0113  BP: 120/78 112/68 (!) 127/102 127/79  Pulse: (!) 127 92 101 (!) 110  Resp: 20 13 17 20   Temp: 99.3 F (37.4 C)   99.8 F (37.7 C)  TempSrc: Oral   Oral  SpO2: 99% 100% 97% 100%  Weight:      Height:        Constitutional: NAD, calm  Eyes: PERTLA, lids and conjunctivae normal ENMT: Mucous membranes are moist. Posterior pharynx clear of any exudate or lesions.   Neck: normal, supple, no masses, no thyromegaly Respiratory:  no wheezing, no crackles. No accessory muscle use.  Cardiovascular: S1 &  S2 heard, regular rate and rhythm. No extremity edema.   Abdomen: No distension, no tenderness, soft. Bowel sounds active.  Musculoskeletal: no clubbing / cyanosis. No joint deformity upper and lower extremities.   Skin: Several erythematous patches overlying lower left leg. Warm, dry, well-perfused. Neurologic: CN 2-12 grossly intact. Sensation intact. Moving all extremities.  Psychiatric: Alert and oriented to person, place, and situation. Pleasant and cooperative.    Labs and  Imaging on Admission: I have personally reviewed following labs and imaging studies  CBC: Recent Labs  Lab 06/21/20 2120  WBC 15.0*  NEUTROABS 10.8*  HGB 13.5  HCT 41.9  MCV 88.2  PLT 779*   Basic Metabolic Panel: Recent Labs  Lab 06/21/20 2120  NA 135  K 3.8  CL 98  CO2 24  GLUCOSE 94  BUN 5*  CREATININE 0.93  CALCIUM 8.8*   GFR: Estimated Creatinine Clearance: 176.7 mL/min (by C-G formula based on SCr of 0.93 mg/dL). Liver Function Tests: Recent Labs  Lab 06/21/20 2120  AST 20  ALT 33  ALKPHOS 123  BILITOT 0.6  PROT 8.7*  ALBUMIN 3.8   Recent Labs  Lab 06/21/20 2120  LIPASE 24   No results for input(s): AMMONIA in the last 168 hours. Coagulation Profile: No results for input(s): INR, PROTIME in the last 168 hours. Cardiac Enzymes: No results for input(s): CKTOTAL, CKMB, CKMBINDEX, TROPONINI in the last 168 hours. BNP (last 3 results) No results for input(s): PROBNP in the last 8760 hours. HbA1C: No results for input(s): HGBA1C in the last 72 hours. CBG: No results for input(s): GLUCAP in the last 168 hours. Lipid Profile: No results for input(s): CHOL, HDL, LDLCALC, TRIG, CHOLHDL, LDLDIRECT in the last 72 hours. Thyroid Function Tests: No results for input(s): TSH, T4TOTAL, FREET4, T3FREE, THYROIDAB in the last 72 hours. Anemia Panel: No results for input(s): VITAMINB12, FOLATE, FERRITIN, TIBC, IRON, RETICCTPCT in the last 72 hours. Urine analysis:    Component Value Date/Time   COLORURINE YELLOW 06/21/2020 2120   APPEARANCEUR CLEAR 06/21/2020 2120   LABSPEC <1.005 (L) 06/21/2020 2120   PHURINE 6.0 06/21/2020 2120   GLUCOSEU NEGATIVE 06/21/2020 2120   HGBUR TRACE (A) 06/21/2020 2120   BILIRUBINUR NEGATIVE 06/21/2020 2120   KETONESUR NEGATIVE 06/21/2020 2120   PROTEINUR NEGATIVE 06/21/2020 2120   NITRITE NEGATIVE 06/21/2020 2120   LEUKOCYTESUR NEGATIVE 06/21/2020 2120   Sepsis Labs: @LABRCNTIP (procalcitonin:4,lacticidven:4) ) Recent  Results (from the past 240 hour(s))  SARS Coronavirus 2 by RT PCR (hospital order, performed in [facility] hospital lab) Nasopharyngeal Nasopharyngeal Swab     Status: None   Collection Time: 06/21/20 11:13 PM   Specimen: Nasopharyngeal Swab  Result Value Ref Range Status   SARS Coronavirus 2 NEGATIVE NEGATIVE Final    Comment: (NOTE) SARS-CoV-2 target nucleic acids are NOT DETECTED.  The SARS-CoV-2 RNA is generally detectable in upper and lower respiratory specimens during the acute phase of infection. The lowest concentration of SARS-CoV-2 viral copies this assay can detect is 250 copies / mL. A negative result does not preclude SARS-CoV-2 infection and should not be used as the sole basis for treatment or other patient management decisions.  A negative result may occur with improper specimen collection / handling, submission of specimen other than nasopharyngeal swab, presence of viral mutation(s) within the areas targeted by this assay, and inadequate number of viral copies (<250 copies / mL). A negative result must be combined with clinical observations, patient history, and epidemiological information.  Fact Sheet for Patients:  StrictlyIdeas.no  Fact Sheet for Healthcare Providers: BankingDealers.co.za  This test is not yet approved or  cleared by the [place] FDA and has been authorized for detection and/or diagnosis of SARS-CoV-2 by FDA under an Emergency Use Authorization (EUA).  This EUA will remain in effect (meaning this test can be used) for the duration of the COVID-19 declaration under Section 564(b)(1) of the Act, 21 U.S.C. section 360bbb-3(b)(1), unless the authorization is terminated or revoked sooner.  Performed at [facility], [address] [place], [place] [address]      Radiological Exams on Admission: CT ABDOMEN PELVIS W CONTRAST  Result Date: 06/21/2020 CLINICAL DATA:  29 year old  male with lower abdominal pain. Concern for acute diverticulitis. EXAM: CT ABDOMEN AND PELVIS WITH CONTRAST TECHNIQUE: Multidetector CT imaging of the abdomen and pelvis was performed using the standard protocol following bolus administration of intravenous contrast. CONTRAST:  113mL OMNIPAQUE IOHEXOL 300 MG/ML  SOLN COMPARISON:  None. FINDINGS: Lower chest: The visualized lung bases are clear. No intra-abdominal free air or free fluid. Hepatobiliary: Apparent mild fatty liver. No intrahepatic biliary ductal dilatation. The gallbladder is unremarkable. Pancreas: Unremarkable. No pancreatic ductal dilatation or surrounding inflammatory changes. Spleen: Normal in size without focal abnormality. Adrenals/Urinary Tract: The adrenal glands are unremarkable. There is no hydronephrosis on either side. Punctate nonobstructing left renal upper pole calculus. Subcentimeter left renal hypodense lesion is too small to characterize. The visualized ureters and urinary bladder appear unremarkable. Stomach/Bowel: There is no bowel obstruction or active inflammation. The appendix is normal. Vascular/Lymphatic: The abdominal aorta is unremarkable. There is large thrombus extending from the left profundus femoral vein to the junction of the common iliac veins and likely inferior aspect of the IVC. There is distension of the left common iliac vein, left internal and external iliac veins, and left femoral vein with surrounding inflammatory changes in keeping with large thrombus. The thrombus appears almost occlusive in the left common iliac vein. There is edema surrounding the left iliac venous system which may represent thrombophlebitis. Clinical correlation is recommended. The main portal vein appears patent. No portal venous gas. There is no adenopathy. Reproductive: The prostate and seminal vesicles are grossly unremarkable. Other: Edema of the left lateral pelvic sidewall and presacral space secondary to large DVT. Musculoskeletal:  No acute or significant osseous findings. IMPRESSION: 1. Large DVT extending from the LEFT profundus femoral vein to the junction of the common iliac veins and likely into the inferior aspect of the IVC. There is stranding surrounding the left iliac venous system which may be reactive or represent thrombophlebitis. Clinical correlation is recommended. 2. No bowel obstruction. Normal appendix. 3. Punctate nonobstructing left renal upper pole calculus. No hydronephrosis. These results were called by telephone at the time of interpretation on 06/21/2020 at 10:23 pm to provider Veryl Speak , who verbally acknowledged these results. Electronically Signed   By: Anner Crete M.D.   On: 06/21/2020 22:23    Assessment/Plan   1. Acute DVT; ?thrombophlebitis  - Patient with hx of LLE DVT in April 2021 s/p thrombectomy in early May 2021, reports strict adherence with Eliquis but presents with severe pain in lower abdomen and proximal LLE, also reports subjective fevers/chills, and is found on CT to have large DVT involving proximal LLE veins and likely distal IVF with question of thrombophlebitis  - In the ED, he was started on IV heparin and vascular surgery was consulted  - Compartments are soft and pulses intact  - Continue IV heparin,  obtain blood cultures and start empiric antibiotics, continue pain-control    DVT prophylaxis: IV heparin  Code Status: Full  Family Communication: Discussed with patient  Disposition Plan:  Patient is from: Home  Anticipated d/c is to: Home  Anticipated d/c date is: 06/24/20 Patient currently: Pending vascular surgery consultation, pain-control  Consults called: Vascular surgery consulted by ED physician  Admission status: Inpatient     Vianne Bulls, MD [facility]  06/22/2020, 3:18 AM

## 2020-06-22 NOTE — ED Notes (Signed)
Report given to [facility]. 

## 2020-06-22 NOTE — Consult Note (Addendum)
Hospital Consult    Reason for Consult:  DVT Requesting Physician:  Dr. Aileen Fass MRN #:  [id number]  History of Present Illness: This is a 29 y.o. 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 He initially was diagnosed with a DVT in April 2021 and had thrombectomy by Dr. Carlis Abbott in Mar 30 2020. He has been following up with Hem/ Onc.This DVT was thought to be provoked from a long drive (>5 hours) so he did not have a full hypercoagulable work up. He has unknown family history as he is adopted.  He was initially put on Xarelto but was transitioned to Eliquis due to bleeding gums. He has been relatively compliant with this. States that maybe over the past several months he has missed 3-4 doses and none have been in the past two weeks or consecutively.  Patient explains that on Thursday he began to not feel very well and felt feverish over the weekend. He also began to have lower abdominal and left groin pain. He thought maybe he had a UTI or kidney stone. He presented to Urgent care on Monday but workup did not show anything abnormal but he was instructed that if it did not get better that he should go to the ER. He went home that night and pain was so significant that he presented to the ER on Tuesday at [facility]. He was subsequently transferred here. CT of the abdomen and pelvis with contrast has shown extensive DVT of profunda femoral vein to the junction of the common iliac veins and likely into the inferior aspect of the IVC. He has was started on IV heparin. He continues to have left lower abdominal pain and groin pain. He also has macular rash on the left leg which he had at time of previous DVT. He has mild swelling of left lower extremity. He has motor and sensation in tact to left leg.   Past Medical History:  Diagnosis Date  . DVT (deep venous thrombosis) ([place]) 02/2020    Past Surgical History:  Procedure Laterality Date  . THROMBECTOMY  03/2020    No Known  Allergies  Prior to Admission medications   Medication Sig Start Date End Date Taking? Authorizing Provider  cetirizine (ZYRTEC) 10 MG tablet Take 10 mg by mouth daily.   Yes [provider]  docusate sodium (COLACE) 100 MG capsule Take 100 mg by mouth daily as needed for mild constipation.   Yes [provider]  ELIQUIS 5 MG TABS tablet Take 5 mg by mouth 2 (two) times daily. 06/15/20  Yes [provider]    Social History   Socioeconomic History  . Marital status: Single    Spouse name: Not on file  . Number of children: Not on file  . Years of education: Not on file  . Highest education level: Not on file  Occupational History  . Not on file  Tobacco Use  . Smoking status: Former Smoker    Types: E-cigarettes  . Smokeless tobacco: Never Used  Substance and Sexual Activity  . Alcohol use: Not Currently  . Drug use: Not Currently  . Sexual activity: Not on file  Other Topics Concern  . Not on file  Social History Narrative  . Not on file   Social Determinants of Health   Financial Resource Strain:   . Difficulty of Paying Living Expenses:   Food Insecurity:   . Worried About Charity fundraiser in the  Last Year:   . Goff in the Last Year:   Transportation Needs:   . Film/video editor (Medical):   Marland Kitchen Lack of Transportation (Non-Medical):   Physical Activity:   . Days of Exercise per Week:   . Minutes of Exercise per Session:   Stress:   . Feeling of Stress :   Social Connections:   . Frequency of Communication with Friends and Family:   . Frequency of Social Gatherings with Friends and Family:   . Attends Religious Services:   . Active Member of Clubs or Organizations:   . Attends Archivist Meetings:   Marland Kitchen Marital Status:   Intimate Partner Violence:   . Fear of Current or Ex-Partner:   . Emotionally Abused:   Marland Kitchen Physically Abused:   . Sexually Abused:     Family History  Problem Relation Age of Onset  .  Venous thrombosis Neg Hx     ROS: Otherwise negative unless mentioned in HPI  Physical Examination  Vitals:   06/22/20 0730 06/22/20 1155  BP: 107/67 110/68  Pulse: (!) 109 79  Resp: 16 16  Temp: 98.7 F (37.1 C) 98.4 F (36.9 C)  SpO2: 97% 99%   Body mass index is 35.3 kg/m.  General:  WDWN in NAD Gait: Not observed HENT: WNL, normocephalic Pulmonary: normal non-labored breathing, without Rales, rhonchi,  wheezing Cardiac: regular rate and rhythm Abdomen:full, soft, NT/ND, no masses Skin: with left medial leg macular rashes Vascular Exam/Pulses: 2+ femoral pulse, 2+ popliteal and DP pulse in left foot. Left leg warm. Motor and sensation in tact. Left groin tenderness Extremities: without ischemic changes, without Gangrene , without cellulitis; without open wounds;  Musculoskeletal: no muscle wasting or atrophy  Neurologic: A&O X 3;  No focal weakness or paresthesias are detected; speech is fluent/normal Psychiatric:  The pt has Normal affect. Lymph:  Unremarkable  CBC    Component Value Date/Time   WBC 15.0 (H) 06/21/2020 2120   RBC 4.75 06/21/2020 2120   HGB 13.5 06/21/2020 2120   HCT 41.9 06/21/2020 2120   PLT 410 (H) 06/21/2020 2120   MCV 88.2 06/21/2020 2120   MCH 28.4 06/21/2020 2120   MCHC 32.2 06/21/2020 2120   RDW 13.5 06/21/2020 2120   LYMPHSABS 2.5 06/21/2020 2120   MONOABS 1.5 (H) 06/21/2020 2120   EOSABS 0.1 06/21/2020 2120   BASOSABS 0.1 06/21/2020 2120    BMET    Component Value Date/Time   NA 136 06/22/2020 0332   K 3.7 06/22/2020 0332   CL 102 06/22/2020 0332   CO2 23 06/22/2020 0332   GLUCOSE 84 06/22/2020 0332   BUN <5 (L) 06/22/2020 0332   CREATININE 0.81 06/22/2020 0332   CALCIUM 8.7 (L) 06/22/2020 0332   GFRNONAA >60 06/22/2020 0332   GFRAA >60 06/22/2020 0332    COAGS: Lab Results  Component Value Date   INR 1.2 06/22/2020     Non-Invasive Vascular Imaging:   EXAM: CT ABDOMEN AND PELVIS WITH  CONTRAST  TECHNIQUE: Multidetector CT imaging of the abdomen and pelvis was performed using the standard protocol following bolus administration of intravenous contrast.  CONTRAST:  160mL OMNIPAQUE IOHEXOL 300 MG/ML  SOLN  COMPARISON:  None.  FINDINGS: Lower chest: The visualized lung bases are clear.  No intra-abdominal free air or free fluid.  Hepatobiliary: Apparent mild fatty liver. No intrahepatic biliary ductal dilatation. The gallbladder is unremarkable.  Pancreas: Unremarkable. No pancreatic ductal dilatation or surrounding inflammatory changes.  Spleen:  Normal in size without focal abnormality.  Adrenals/Urinary Tract: The adrenal glands are unremarkable. There is no hydronephrosis on either side. Punctate nonobstructing left renal upper pole calculus. Subcentimeter left renal hypodense lesion is too small to characterize. The visualized ureters and urinary bladder appear unremarkable.  Stomach/Bowel: There is no bowel obstruction or active inflammation. The appendix is normal.  Vascular/Lymphatic: The abdominal aorta is unremarkable. There is large thrombus extending from the left profundus femoral vein to the junction of the common iliac veins and likely inferior aspect of the IVC. There is distension of the left common iliac vein, left internal and external iliac veins, and left femoral vein with surrounding inflammatory changes in keeping with large thrombus. The thrombus appears almost occlusive in the left common iliac vein. There is edema surrounding the left iliac venous system which may represent thrombophlebitis. Clinical correlation is recommended. The main portal vein appears patent. No portal venous gas. There is no adenopathy.  Reproductive: The prostate and seminal vesicles are grossly unremarkable.  Other: Edema of the left lateral pelvic sidewall and presacral space secondary to large DVT.  Musculoskeletal: No acute or  significant osseous findings.  IMPRESSION: 1. Large DVT extending from the LEFT profundus femoral vein to the junction of the common iliac veins and likely into the inferior aspect of the IVC. There is stranding surrounding the left iliac venous system which may be reactive or represent thrombophlebitis. Clinical correlation is recommended. 2. No bowel obstruction. Normal appendix. 3. Punctate nonobstructing left renal upper pole calculus. No hydronephrosis.   Statin:  No. Beta Blocker:  No. Aspirin:  Yes.   ACEI:  No. ARB:  No. CCB use:  No Other antiplatelets/anticoagulants:  Yes.   Eliquis   ASSESSMENT/PLAN: This is a 29 y.o. 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 Previous thrombectomy by Dr. Carlis Abbott in May. CT showing Large DVT extending from the LEFT profundus femoral vein to the junction of the common iliac veins and likely into the inferior aspect of the IVC. Currently on IV heparin. His left lower extremity is well perfused and warm with motor and sensory intact. He does not have any signs of phlegmasia. He will benefit from evaluation by Hem/ Onc as this is now an unprovoked DVT in a young male on anticoagulation. He previously did not have a full hypercoagulable workup but would recommend this at this point. I have placed order for venous duplex of IVC/ Iliac veins as well as venous duplex of his left lower extremity.  I have seen and evaluated patient with Dr. Carlis Abbott. Plan is to continue IV heparin over night. He will be NPO after midnight. He is scheduled for thrombolysis tomorrow with Dr. Tracey Harries PA-C [facility] [phone] 06/22/2020  4:17 PM   I have seen and evaluated the patient. I agree with the PA note as documented above.  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  At that time on IVUS there was no evidence of May Thurner and he had no thrombus in the IVC, common iliac and external iliac vein on the left.  He states he has been mostly compliant with his Eliquis except for missing 3-4 doses sporadically.  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  Unclear to me as to the etiology and I think we should engage hematology oncology who was previously following him and certainly raises other questions about the etiology.  I have recommended taking him to the Cath Lab tomorrow for left leg venous intervention with likely IVUS and possible thrombolysis versus additional percutaneous mechanical thrombectomy.  We have ordered left leg venous duplex as well as IVC and iliac vein duplex.  He can remain on heparin for now.  NPO after midnight.  Marty Heck, MD [facility] [place] Office: [phone]

## 2020-06-22 NOTE — Progress Notes (Addendum)
ANTICOAGULATION CONSULT NOTE  Pharmacy Consult for Heparin Indication: DVT  No Known Allergies  Patient Measurements: Height: 6\' 4"  (193 cm) Weight: (!) 131.5 kg (290 lb) IBW/kg (Calculated) : 86.8 Heparin Dosing Weight: 115.4 kg  Vital Signs: Temp: 98.7 F (37.1 C) (07/28 0730) Temp Source: Oral (07/28 0730) BP: 107/67 (07/28 0730) Pulse Rate: 109 (07/28 0730)  Labs: Recent Labs    06/21/20 2120 06/21/20 2319 06/22/20 0332 06/22/20 0652  HGB 13.5  --   --   --   HCT 41.9  --   --   --   PLT 410*  --   --   --   APTT  --   --   --  55*  HEPARINUNFRC  --  1.70*  --   --   CREATININE 0.93  --  0.81  --     Estimated Creatinine Clearance: 202.9 mL/min (by C-G formula based on SCr of 0.81 mg/dL).   Medical History: Past Medical History:  Diagnosis Date   DVT (deep venous thrombosis) ([place]) 02/2020      Assessment: Patient is a 63 YOM that presented with leg and abd pain. Patient was originally diagnosed with DVT in April 2021 and started on Xarelto.  In May 2021, pt noted increased pain and underwent thrombectomy and was transitioned to Eliquis. The patient reports strict adherence to Eliquis but now CT scan shows extensive DVT in femoral and iliac veins. Pharmacy has been consulted to dose heparin.   Last dose of Apixaban was at 0900 on 7/27. Baseline Heparin level 1.7, reflecting recent apixaban use. Given elevated baseline heparin level and recent apixaban use, will utilize aPTTs to guide heparin dosing.  aPTT subtherapeutic on heparin 1900 units/hr. CBC stable and no report of bleeding.   Goal of Therapy:  aPTT 66-102 seconds HL 0.3-0.7 Monitor platelets by anticoagulation protocol: Yes   Plan:  - Increase heparin drip to 2200 units/hr  - Will monitor heparin using aPTT until aPTT and Heparin levels correlate - aPTT level in ~ 6 hours  - Daily heparin level - Baseline INR, follow up if transitioning to warfarin - Monitor patient for s/s of bleeding and CBC  while on heparin   Romilda Garret, PharmD PGY1 Acute Care Pharmacy Resident Phone: [phone] 06/22/2020 8:10 AM  Please check [email or web address] for unit specific pharmacy phone numbers.

## 2020-06-22 NOTE — Progress Notes (Signed)
[facility] PROGRESS NOTE    Progress Note  Noah Moore  JSH:[id number] DOB: 12/07/1990 DOA: 06/21/2020 PCP: Patient, No Pcp Per     Brief Narrative:   Noah Moore is an 29 y.o. male past medical history significant for left lower extremity DVT now presents to the emergency room with lower lower abdominal pain and proximal left leg pain fever and chills.  He reports 3 conditions to Eliquis, he relates his symptoms t started about 5 days prior to admission.  Assessment/Plan:   Large left lower ext DVT (deep venous thrombosis) ([place]): Had a recent thrombectomy in May 2021, he was supposed to continue his to his anticoagulation.  CT of the abdomen and pelvis with contrast showed a large DVT. On IV heparin vascular was consulted. D/c IV empiric antibiotics continue narcotics for pain. He has remained afebrile but has a leukocytosis probably due to large DVT  Thrombophlebitis    DVT prophylaxis: heparin Family Communication:none Status is: Inpatient  Remains inpatient appropriate because:Hemodynamically unstable   Dispo: The patient is from: Home              Anticipated d/c is to: Home              Anticipated d/c date is: > 3 days              Patient currently is not medically stable to d/c.        Code Status:     Code Status Orders  (From admission, onward)         Start     Ordered   06/22/20 0252  Full code  Continuous        06/22/20 0254        Code Status History    This patient has a current code status but no historical code status.   Advance Care Planning Activity        IV Access:    Peripheral IV   Procedures and diagnostic studies:   CT ABDOMEN PELVIS W CONTRAST  Result Date: 06/21/2020 CLINICAL DATA:  29 year old male with lower abdominal pain. Concern for acute diverticulitis. EXAM: CT ABDOMEN AND PELVIS WITH CONTRAST TECHNIQUE: Multidetector CT imaging of the abdomen and pelvis was performed using the standard  protocol following bolus administration of intravenous contrast. CONTRAST:  189mL OMNIPAQUE IOHEXOL 300 MG/ML  SOLN COMPARISON:  None. FINDINGS: Lower chest: The visualized lung bases are clear. No intra-abdominal free air or free fluid. Hepatobiliary: Apparent mild fatty liver. No intrahepatic biliary ductal dilatation. The gallbladder is unremarkable. Pancreas: Unremarkable. No pancreatic ductal dilatation or surrounding inflammatory changes. Spleen: Normal in size without focal abnormality. Adrenals/Urinary Tract: The adrenal glands are unremarkable. There is no hydronephrosis on either side. Punctate nonobstructing left renal upper pole calculus. Subcentimeter left renal hypodense lesion is too small to characterize. The visualized ureters and urinary bladder appear unremarkable. Stomach/Bowel: There is no bowel obstruction or active inflammation. The appendix is normal. Vascular/Lymphatic: The abdominal aorta is unremarkable. There is large thrombus extending from the left profundus femoral vein to the junction of the common iliac veins and likely inferior aspect of the IVC. There is distension of the left common iliac vein, left internal and external iliac veins, and left femoral vein with surrounding inflammatory changes in keeping with large thrombus. The thrombus appears almost occlusive in the left common iliac vein. There is edema surrounding the left iliac venous system which may represent thrombophlebitis. Clinical correlation is recommended. The main portal vein appears  patent. No portal venous gas. There is no adenopathy. Reproductive: The prostate and seminal vesicles are grossly unremarkable. Other: Edema of the left lateral pelvic sidewall and presacral space secondary to large DVT. Musculoskeletal: No acute or significant osseous findings. IMPRESSION: 1. Large DVT extending from the LEFT profundus femoral vein to the junction of the common iliac veins and likely into the inferior aspect of the  IVC. There is stranding surrounding the left iliac venous system which may be reactive or represent thrombophlebitis. Clinical correlation is recommended. 2. No bowel obstruction. Normal appendix. 3. Punctate nonobstructing left renal upper pole calculus. No hydronephrosis. These results were called by telephone at the time of interpretation on 06/21/2020 at 10:23 pm to provider Veryl Speak , who verbally acknowledged these results. Electronically Signed   By: Anner Crete M.D.   On: 06/21/2020 22:23     Medical Consultants:    None.  Anti-Infectives:   none  Subjective:    Noah Moore continues to complain of left lower extremity pain.  Objective:    Vitals:   06/22/20 0020 06/22/20 0113 06/22/20 0408 06/22/20 0730  BP: (!) 127/102 127/79 119/69 107/67  Pulse: 101 (!) 110 (!) 110 (!) 109  Resp: 17 20 20 16   Temp:  99.8 F (37.7 C) 99 F (37.2 C) 98.7 F (37.1 C)  TempSrc:  Oral Oral Oral  SpO2: 97% 100% 100% 97%  Weight:      Height:       SpO2: 97 %   Intake/Output Summary (Last 24 hours) at 06/22/2020 0940 Last data filed at 06/22/2020 0927 Gross per 24 hour  Intake 1253.2 ml  Output 600 ml  Net 653.2 ml   Filed Weights   06/21/20 1930  Weight: (!) 131.5 kg    Exam: General exam: In no acute distress. Respiratory system: Good air movement and clear to auscultation. Cardiovascular system: S1 & S2 heard, RRR. No JVD. Gastrointestinal system: Abdomen is nondistended, soft and nontender.  Extremities: No pedal edema. Skin: No rashes, lesions or ulcers Psychiatry: Judgement and insight appear normal. Mood & affect appropriate.    Data Reviewed:    Labs: Basic Metabolic Panel: Recent Labs  Lab 06/21/20 2120 06/22/20 0332  NA 135 136  K 3.8 3.7  CL 98 102  CO2 24 23  GLUCOSE 94 84  BUN 5* <5*  CREATININE 0.93 0.81  CALCIUM 8.8* 8.7*   GFR Estimated Creatinine Clearance: 202.9 mL/min (by C-G formula based on SCr of 0.81 mg/dL). Liver  Function Tests: Recent Labs  Lab 06/21/20 2120  AST 20  ALT 33  ALKPHOS 123  BILITOT 0.6  PROT 8.7*  ALBUMIN 3.8   Recent Labs  Lab 06/21/20 2120  LIPASE 24   No results for input(s): AMMONIA in the last 168 hours. Coagulation profile No results for input(s): INR, PROTIME in the last 168 hours. COVID-19 Labs  No results for input(s): DDIMER, FERRITIN, LDH, CRP in the last 72 hours.  Lab Results  Component Value Date   [place] NEGATIVE 06/21/2020    CBC: Recent Labs  Lab 06/21/20 2120  WBC 15.0*  NEUTROABS 10.8*  HGB 13.5  HCT 41.9  MCV 88.2  PLT 410*   Cardiac Enzymes: No results for input(s): CKTOTAL, CKMB, CKMBINDEX, TROPONINI in the last 168 hours. BNP (last 3 results) No results for input(s): PROBNP in the last 8760 hours. CBG: No results for input(s): GLUCAP in the last 168 hours. D-Dimer: No results for input(s): DDIMER in the last 72  hours. Hgb A1c: No results for input(s): HGBA1C in the last 72 hours. Lipid Profile: No results for input(s): CHOL, HDL, LDLCALC, TRIG, CHOLHDL, LDLDIRECT in the last 72 hours. Thyroid function studies: No results for input(s): TSH, T4TOTAL, T3FREE, THYROIDAB in the last 72 hours.  Invalid input(s): FREET3 Anemia work up: No results for input(s): VITAMINB12, FOLATE, FERRITIN, TIBC, IRON, RETICCTPCT in the last 72 hours. Sepsis Labs: Recent Labs  Lab 06/21/20 2120 06/22/20 0332  WBC 15.0*  --   LATICACIDVEN  --  1.3   Microbiology Recent Results (from the past 240 hour(s))  SARS Coronavirus 2 by RT PCR (hospital order, performed in [facility] hospital lab) Nasopharyngeal Nasopharyngeal Swab     Status: None   Collection Time: 06/21/20 11:13 PM   Specimen: Nasopharyngeal Swab  Result Value Ref Range Status   SARS Coronavirus 2 NEGATIVE NEGATIVE Final    Comment: (NOTE) SARS-CoV-2 target nucleic acids are NOT DETECTED.  The SARS-CoV-2 RNA is generally detectable in upper and lower respiratory specimens  during the acute phase of infection. The lowest concentration of SARS-CoV-2 viral copies this assay can detect is 250 copies / mL. A negative result does not preclude SARS-CoV-2 infection and should not be used as the sole basis for treatment or other patient management decisions.  A negative result may occur with improper specimen collection / handling, submission of specimen other than nasopharyngeal swab, presence of viral mutation(s) within the areas targeted by this assay, and inadequate number of viral copies (<250 copies / mL). A negative result must be combined with clinical observations, patient history, and epidemiological information.  Fact Sheet for Patients:   StrictlyIdeas.no  Fact Sheet for Healthcare Providers: BankingDealers.co.za  This test is not yet approved or  cleared by the [place] FDA and has been authorized for detection and/or diagnosis of SARS-CoV-2 by FDA under an Emergency Use Authorization (EUA).  This EUA will remain in effect (meaning this test can be used) for the duration of the COVID-19 declaration under Section 564(b)(1) of the Act, 21 U.S.C. section 360bbb-3(b)(1), unless the authorization is terminated or revoked sooner.  Performed at [facility], [address] [place], [place] [address]      Medications:    Continuous Infusions: . sodium chloride 115 mL/hr at 06/22/20 0406  . cefTRIAXone (ROCEPHIN)  IV 1 g (06/22/20 0417)  . heparin 2,200 Units/hr (06/22/20 0927)  . metronidazole 500 mg (06/22/20 0410)      LOS: 0 days   Charlynne Cousins  [facility]  06/22/2020, 9:40 AM

## 2020-06-23 ENCOUNTER — Inpatient Hospital Stay (HOSPITAL_COMMUNITY): Payer: 59

## 2020-06-23 ENCOUNTER — Encounter (HOSPITAL_COMMUNITY): Payer: Self-pay | Admitting: Family Medicine

## 2020-06-23 ENCOUNTER — Encounter (HOSPITAL_COMMUNITY)
Admission: EM | Disposition: A | Discharge status: Home / Self Care | Payer: Self-pay | Source: Home / Self Care | Attending: Internal Medicine

## 2020-06-23 DIAGNOSIS — M7989 Other specified soft tissue disorders: Secondary | ICD-10-CM

## 2020-06-23 HISTORY — PX: LOWER EXTREMITY VENOGRAPHY: CATH118253

## 2020-06-23 LAB — CBC
HCT: 39.1 % (ref 39.0–52.0)
HCT: 42.2 % (ref 39.0–52.0)
Hemoglobin: 12.2 g/dL — ABNORMAL LOW (ref 13.0–17.0)
Hemoglobin: 13.3 g/dL (ref 13.0–17.0)
MCH: 27.6 pg (ref 26.0–34.0)
MCH: 28.1 pg (ref 26.0–34.0)
MCHC: 31.2 g/dL (ref 30.0–36.0)
MCHC: 31.5 g/dL (ref 30.0–36.0)
MCV: 88.5 fL (ref 80.0–100.0)
MCV: 89 fL (ref 80.0–100.0)
Platelets: 397 10*3/uL (ref 150–400)
Platelets: 402 10*3/uL — ABNORMAL HIGH (ref 150–400)
RBC: 4.42 MIL/uL (ref 4.22–5.81)
RBC: 4.74 MIL/uL (ref 4.22–5.81)
RDW: 13.7 % (ref 11.5–15.5)
RDW: 13.7 % (ref 11.5–15.5)
WBC: 10.2 10*3/uL (ref 4.0–10.5)
WBC: 10.4 10*3/uL (ref 4.0–10.5)
nRBC: 0 % (ref 0.0–0.2)
nRBC: 0 % (ref 0.0–0.2)

## 2020-06-23 LAB — APTT
aPTT: 65 seconds — ABNORMAL HIGH (ref 24–36)
aPTT: 121 seconds — ABNORMAL HIGH (ref 24–36)
aPTT: 36 seconds (ref 24–36)

## 2020-06-23 LAB — BASIC METABOLIC PANEL
Anion gap: 9 (ref 5–15)
BUN: <5 mg/dL — ABNORMAL LOW (ref 6–20)
CO2: 26 mmol/L (ref 22–32)
Calcium: 8.8 mg/dL — ABNORMAL LOW (ref 8.9–10.3)
Chloride: 102 mmol/L (ref 98–111)
Creatinine, Ser: 0.87 mg/dL (ref 0.61–1.24)
GFR calc Af Amer: >60 mL/min (ref >60)
GFR calc non Af Amer: >60 mL/min (ref >60)
Glucose, Bld: 124 mg/dL — ABNORMAL HIGH (ref 70–99)
Potassium: 3.8 mmol/L (ref 3.5–5.1)
Sodium: 137 mmol/L (ref 135–145)

## 2020-06-23 LAB — HEPARIN LEVEL (UNFRACTIONATED)
Heparin Unfractionated: 0.39 IU/mL (ref 0.30–0.70)
Heparin Unfractionated: <0.1 IU/mL — ABNORMAL LOW (ref 0.30–0.70)

## 2020-06-23 LAB — FIBRINOGEN: Fibrinogen: 782 mg/dL — ABNORMAL HIGH (ref 210–475)

## 2020-06-23 SURGERY — LOWER EXTREMITY VENOGRAPHY
Anesthesia: LOCAL

## 2020-06-23 MED ORDER — SODIUM CHLORIDE 0.9% FLUSH
3.0000 mL | Freq: Two times a day (BID) | INTRAVENOUS | Status: DC
  Administered 2020-06-23 – 2020-06-26 (×3): 3 mL via INTRAVENOUS

## 2020-06-23 MED ORDER — HEPARIN (PORCINE) IN NACL 1000-0.9 UT/500ML-% IV SOLN
INTRAVENOUS | Status: DC | PRN
  Administered 2020-06-23: 500 mL

## 2020-06-23 MED ORDER — LORAZEPAM 2 MG/ML IJ SOLN
1.0000 mg | Freq: Once | INTRAMUSCULAR | Status: AC
  Administered 2020-06-23: 1 mg via INTRAVENOUS
  Filled 2020-06-23: qty 1

## 2020-06-23 MED ORDER — MIDAZOLAM HCL 2 MG/2ML IJ SOLN
INTRAMUSCULAR | Status: AC
  Filled 2020-06-23: qty 2

## 2020-06-23 MED ORDER — SODIUM CHLORIDE 0.9% FLUSH: 10.0000 mL | INTRAVENOUS | Status: DC | PRN

## 2020-06-23 MED ORDER — SODIUM CHLORIDE 0.9 % IV SOLN
INTRAVENOUS | Status: AC | PRN
  Administered 2020-06-23: 10 mL/h via INTRAVENOUS

## 2020-06-23 MED ORDER — CHLORHEXIDINE GLUCONATE CLOTH 2 % EX PADS
6.0000 | MEDICATED_PAD | Freq: Every day | CUTANEOUS | Status: DC
  Administered 2020-06-23: 6 via TOPICAL

## 2020-06-23 MED ORDER — SODIUM CHLORIDE 0.9 % IV SOLN
0.2500 mg/h | INTRAVENOUS | Status: DC
  Administered 2020-06-23: 0.25 mg/h
  Filled 2020-06-23 (×2): qty 10

## 2020-06-23 MED ORDER — LIDOCAINE HCL (PF) 1 % IJ SOLN
INTRAMUSCULAR | Status: AC
  Filled 2020-06-23: qty 30

## 2020-06-23 MED ORDER — LIDOCAINE HCL (PF) 1 % IJ SOLN
INTRAMUSCULAR | Status: DC | PRN
  Administered 2020-06-23: 15 mL
  Administered 2020-06-23: 30 mL
  Administered 2020-06-23: 15 mL

## 2020-06-23 MED ORDER — SODIUM CHLORIDE 0.9% FLUSH: 10.0000 mL | Freq: Two times a day (BID) | INTRAVENOUS | Status: DC

## 2020-06-23 MED ORDER — FENTANYL CITRATE (PF) 100 MCG/2ML IJ SOLN
INTRAMUSCULAR | Status: DC | PRN
  Administered 2020-06-23: 25 ug via INTRAVENOUS
  Administered 2020-06-23 (×3): 50 ug via INTRAVENOUS

## 2020-06-23 MED ORDER — SODIUM CHLORIDE 0.9 % IV SOLN: INTRAVENOUS | Status: DC

## 2020-06-23 MED ORDER — MIDAZOLAM HCL 2 MG/2ML IJ SOLN: 1.0000 mg | INTRAMUSCULAR | Status: DC | PRN

## 2020-06-23 MED ORDER — SODIUM CHLORIDE 0.9 % IV SOLN: 250.0000 mL | INTRAVENOUS | Status: DC | PRN

## 2020-06-23 MED ORDER — SODIUM CHLORIDE 0.9 % IV SOLN
0.2500 mg/h | INTRAVENOUS | Status: DC
Start: 2020-06-23 — End: 2020-06-23

## 2020-06-23 MED ORDER — IODIXANOL 320 MG/ML IV SOLN
INTRAVENOUS | Status: DC | PRN
  Administered 2020-06-23: 55 mL via INTRAVENOUS

## 2020-06-23 MED ORDER — MORPHINE SULFATE (PF) 4 MG/ML IV SOLN: 5.0000 mg | INTRAVENOUS | Status: DC | PRN

## 2020-06-23 MED ORDER — FENTANYL CITRATE (PF) 100 MCG/2ML IJ SOLN
INTRAMUSCULAR | Status: AC
  Filled 2020-06-23: qty 2

## 2020-06-23 MED ORDER — HEPARIN (PORCINE) 25000 UT/250ML-% IV SOLN
2550.0000 [IU]/h | INTRAVENOUS | Status: DC
  Administered 2020-06-23: 2200 [IU]/h via INTRAVENOUS
  Administered 2020-06-24: 2500 [IU]/h via INTRAVENOUS
  Administered 2020-06-24: 2550 [IU]/h via INTRAVENOUS
  Filled 2020-06-23 (×3): qty 250

## 2020-06-23 MED ORDER — SODIUM CHLORIDE 0.9% FLUSH: 3.0000 mL | INTRAVENOUS | Status: DC | PRN

## 2020-06-23 MED ORDER — ONDANSETRON HCL 4 MG/2ML IJ SOLN: 4.0000 mg | Freq: Four times a day (QID) | INTRAMUSCULAR | Status: DC | PRN

## 2020-06-23 MED ORDER — HEPARIN (PORCINE) IN NACL 1000-0.9 UT/500ML-% IV SOLN
INTRAVENOUS | Status: AC
  Filled 2020-06-23: qty 500

## 2020-06-23 MED ORDER — MIDAZOLAM HCL 2 MG/2ML IJ SOLN
INTRAMUSCULAR | Status: DC | PRN
  Administered 2020-06-23 (×4): 2 mg via INTRAVENOUS

## 2020-06-23 SURGICAL SUPPLY — 16 items
BAG SNAP BAND KOVER 36X36 (MISCELLANEOUS) ×3 IMPLANT
CATH ANGIO 5F BER 100CM (CATHETERS) ×3 IMPLANT
CATH PULSE SPRAY 45CMX15CM 5FR (CATHETERS) ×3 IMPLANT
CATH VISIONS PV .035 IVUS (CATHETERS) ×3 IMPLANT
COVER DOME SNAP 22 D (MISCELLANEOUS) ×3 IMPLANT
GLIDEWIRE ADV .035X260CM (WIRE) ×6 IMPLANT
KIT MICROPUNCTURE NIT STIFF (SHEATH) ×3 IMPLANT
PROTECTION STATION PRESSURIZED (MISCELLANEOUS) ×3
SHEATH PINNACLE 5F 10CM (SHEATH) IMPLANT
SHEATH PINNACLE 8F 10CM (SHEATH) ×6 IMPLANT
SHEATH PROBE COVER 6X72 (BAG) ×6 IMPLANT
STATION PROTECTION PRESSURIZED (MISCELLANEOUS) ×2 IMPLANT
TRAY PV CATH (CUSTOM PROCEDURE TRAY) ×3 IMPLANT
TUBE CONN 8.8X1320 FR HP M-F (CONNECTOR) IMPLANT
WIRE BENTSON .035X145CM (WIRE) ×3 IMPLANT
WIRE TORQFLEX AUST .018X40CM (WIRE) ×9 IMPLANT

## 2020-06-23 NOTE — Progress Notes (Addendum)
[place] for Heparin Indication: DVT  No Known Allergies  Patient Measurements: Height: 6\' 4"  (193 cm) Weight: (!) 131.5 kg (290 lb) IBW/kg (Calculated) : 86.8 Heparin Dosing Weight: 115.4 kg  Vital Signs: Temp: 99.5 F (37.5 C) (07/29 0101) Temp Source: Oral (07/29 0101) BP: 113/61 (07/29 0101) Pulse Rate: 108 (07/29 0101)  Labs: Recent Labs    06/21/20 2120 06/21/20 2319 06/22/20 0332 06/22/20 0652 06/22/20 1504 06/23/20 0036  HGB 13.5  --   --   --   --  12.2*  HCT 41.9  --   --   --   --  39.1  PLT 410*  --   --   --   --  397  APTT  --   --   --  55* 90* 65*  LABPROT  --   --   --   --  14.9  --   INR  --   --   --   --  1.2  --   HEPARINUNFRC  --  1.70*  --  1.06*  --   --   CREATININE 0.93  --  0.81  --   --  0.87    Estimated Creatinine Clearance: 188.9 mL/min (by C-G formula based on SCr of 0.87 mg/dL).   Assessment: 29 y.o. male admitted with recurrent DVT, h/o DVT and Eliquis on hold, for heparin  APTT slightly subtherapeutic tonight  Goal of Therapy:  aPTT 66-102 seconds Heparin level 0.3-0.7 Monitor platelets by anticoagulation protocol: Yes   Plan:  Increase Heparin 2400 units/hr F/U after procedure in AM  Phillis Knack, PharmD, BCPS  06/23/2020 1:27 AM

## 2020-06-23 NOTE — Op Note (Signed)
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  Ultrasound-guided access of left popliteal vein 2.  Ultrasound-guided access of left common femoral vein 3.  Left femoral and iliac venogram 4.  IVUS of left external iliac vein, left common iliac vein and IVC 5.  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  He recently presented here with now worsening symptoms and evidence of acute DVT in the left iliac and IVC that is new.  He presents for planned evaluation with venogram IVUS and possible thrombolysis.  Findings:  Initially accessed the left popliteal in the prone position and ultimately I could not get across the left common femoral vein occlusion from popliteal access and this appeared chronic with large collaterals.  I left my sheath and flipped him over and accessed left common femoral vein and again could not get a wire to track.  I was able to stick higher on the left common femoral0 vein and finally got a wire into the external iliac common iliac vein and into the IVC.  On IVUS there appears to be acute on chronic thrombus in the left external and common iliac vein and distal IVC.  A thrombolytic's catheter was placed.   Procedure:  The patient was identified in the holding area and taken to room 8.  The patient was then placed prone on the table and prepped and draped in the usual sterile fashion.  A time out was called.  Ultrasound was used to evaluate the left popliteal  vein.  Initially accessed the left popliteal vein with a micro needle placed a microwire.  I could not get this wire to track across the popliteal thrombus and suspected this was chronic and met significant resistance.  Finally I was able to get in the small saphenous and got a wire and micro sheath in place and finally placed an 8 French sheath and tried to get a Glidewire advantage up the femoral vein but could not get across the common femoral vein occlusion.  Venogram showed large collateral and this appeared chronic.  I then flipped him over both groins were prepped and draped and accessed the left common femoral vein and got venous backbleeding but again could not get a wire to track and suspect this was chronically occluded and it was not until I accessed the vein much higher that I got a wire into the iliac vein without significant resistance.  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  Subsequently did perform venogram to assure there was no extravasation prior to initiation of lysis.  I then placed a thrombolytics catheter in the left external and common iliac vein into the IVC for a total treatment length of 30 cm.  All sheaths were secured and be taken to ICU.  Plan: Lytics check tomorrow. Run TPA at 0.25 mg/hr and heparin at 800 units/hr through the  sheath.      Marty Heck, MD [facility] Office: [phone]

## 2020-06-23 NOTE — Progress Notes (Signed)
ANTICOAGULATION CONSULT NOTE  Pharmacy Consult for Heparin Indication: DVT  No Known Allergies  Patient Measurements: Height: 6\' 4"  (193 cm) Weight: (!) 131.5 kg (290 lb) IBW/kg (Calculated) : 86.8 Heparin Dosing Weight: 115.4 kg  Vital Signs: Temp: 99.2 F (37.3 C) (07/29 2000) Temp Source: Oral (07/29 2000) BP: 128/88 (07/29 1900) Pulse Rate: 106 (07/29 1900)  Labs: Recent Labs    06/21/20 2120 06/21/20 2120 06/21/20 2319 06/22/20 0332 06/22/20 1610 06/22/20 9604 06/22/20 1504 06/23/20 0036 06/23/20 0042 06/23/20 0816 06/23/20 2102  HGB 13.5   < >  --   --   --   --   --  12.2*  --   --  13.3  HCT 41.9  --   --   --   --   --   --  39.1  --   --  42.2  PLT 410*  --   --   --   --   --   --  397  --   --  402*  APTT  --    < >  --   --  55*   < > 90* 65*  --  121* 36  LABPROT  --   --   --   --   --   --  14.9  --   --   --   --   INR  --   --   --   --   --   --  1.2  --   --   --   --   HEPARINUNFRC  --   --    < >  --  1.06*  --   --   --  0.39  --  <0.10*  CREATININE 0.93  --   --  0.81  --   --   --  0.87  --   --   --    < > = values in this interval not displayed.    Estimated Creatinine Clearance: 188.9 mL/min (by C-G formula based on SCr of 0.87 mg/dL).   Assessment: 38 YOM with history of LLE DVT in April 2021 - started on Xarelto however required thrombectomy in May 2021 and transitioned to Apixaban at that time. Imaging shows recurrent LLE DVT this admission and pharmacy consulted to bridge with Hepari. Last dose of apixaban was 7/27 at 8am  He is now s/p OR with alteplase at 12.5 ml/hr and heparin at 2000 units/hr through the sheath.  APTT and heparin level are both sub-therapeutic; fibrinogen level is elevated.  No issue with heparin infusion nor bleeding per RN.  Goal of Therapy:  aPTT 66-102 seconds Heparin level 0.3-0.7 Monitor platelets by anticoagulation protocol: Yes   Plan:  Increase heparin gtt to 2200 units/hr Alteplase per  Vascular F/U AM labs  Samarie Pinder D. Mina Marble, PharmD, BCPS, [place] 06/23/2020, 10:06 PM

## 2020-06-23 NOTE — Progress Notes (Signed)
[facility] PROGRESS NOTE    Progress Note  Kruze Atchley  NFA:[id number] DOB: 02-Apr-1991 DOA: 06/21/2020 PCP: Patient, No Pcp Per     Brief Narrative:   Noah Moore is an 29 y.o. male past medical history significant for left lower extremity DVT now presents to the emergency room with lower lower abdominal pain and proximal left leg pain fever and chills.  He reports 3 conditions to Eliquis, he relates his symptoms t started about 5 days prior to admission.  Assessment/Plan:   Large left lower ext DVT (deep venous thrombosis) ([place]): Had a recent thrombectomy in May 2021, he was supposed to continue his to his anticoagulation.  CT of the abdomen and pelvis with contrast showed a large DVT. Surgery was consulted recommended IVUS possible thrombolysis versus percutaneous thrombectomy on 06/23/2020. We will consult hematology for further evaluation.   Thrombophlebitis    DVT prophylaxis: heparin Family Communication:none Status is: Inpatient  Remains inpatient appropriate because:Hemodynamically unstable   Dispo: The patient is from: Home              Anticipated d/c is to: Home              Anticipated d/c date is: > 3 days              Patient currently is not medically stable to d/c.        Code Status:     Code Status Orders  (From admission, onward)         Start     Ordered   06/22/20 0252  Full code  Continuous        06/22/20 0254        Code Status History    This patient has a current code status but no historical code status.   Advance Care Planning Activity        IV Access:    Peripheral IV   Procedures and diagnostic studies:   CT ABDOMEN PELVIS W CONTRAST  Result Date: 06/21/2020 CLINICAL DATA:  29 year old male with lower abdominal pain. Concern for acute diverticulitis. EXAM: CT ABDOMEN AND PELVIS WITH CONTRAST TECHNIQUE: Multidetector CT imaging of the abdomen and pelvis was performed using the standard protocol  following bolus administration of intravenous contrast. CONTRAST:  152mL OMNIPAQUE IOHEXOL 300 MG/ML  SOLN COMPARISON:  None. FINDINGS: Lower chest: The visualized lung bases are clear. No intra-abdominal free air or free fluid. Hepatobiliary: Apparent mild fatty liver. No intrahepatic biliary ductal dilatation. The gallbladder is unremarkable. Pancreas: Unremarkable. No pancreatic ductal dilatation or surrounding inflammatory changes. Spleen: Normal in size without focal abnormality. Adrenals/Urinary Tract: The adrenal glands are unremarkable. There is no hydronephrosis on either side. Punctate nonobstructing left renal upper pole calculus. Subcentimeter left renal hypodense lesion is too small to characterize. The visualized ureters and urinary bladder appear unremarkable. Stomach/Bowel: There is no bowel obstruction or active inflammation. The appendix is normal. Vascular/Lymphatic: The abdominal aorta is unremarkable. There is large thrombus extending from the left profundus femoral vein to the junction of the common iliac veins and likely inferior aspect of the IVC. There is distension of the left common iliac vein, left internal and external iliac veins, and left femoral vein with surrounding inflammatory changes in keeping with large thrombus. The thrombus appears almost occlusive in the left common iliac vein. There is edema surrounding the left iliac venous system which may represent thrombophlebitis. Clinical correlation is recommended. The main portal vein appears patent. No portal venous gas. There is  no adenopathy. Reproductive: The prostate and seminal vesicles are grossly unremarkable. Other: Edema of the left lateral pelvic sidewall and presacral space secondary to large DVT. Musculoskeletal: No acute or significant osseous findings. IMPRESSION: 1. Large DVT extending from the LEFT profundus femoral vein to the junction of the common iliac veins and likely into the inferior aspect of the IVC. There  is stranding surrounding the left iliac venous system which may be reactive or represent thrombophlebitis. Clinical correlation is recommended. 2. No bowel obstruction. Normal appendix. 3. Punctate nonobstructing left renal upper pole calculus. No hydronephrosis. These results were called by telephone at the time of interpretation on 06/21/2020 at 10:23 pm to provider Veryl Speak , who verbally acknowledged these results. Electronically Signed   By: Anner Crete M.D.   On: 06/21/2020 22:23     Medical Consultants:    None.  Anti-Infectives:   none  Subjective:    Abelino Tippin relates his pain is controlled.  Objective:    Vitals:   06/22/20 1155 06/22/20 2008 06/23/20 0101 06/23/20 0434  BP: 110/68 120/84 (!) 113/61 119/78  Pulse: 79 85 (!) 108 77  Resp: 16 20 20 20   Temp: 98.4 F (36.9 C) 97.6 F (36.4 C) 99.5 F (37.5 C) 98.2 F (36.8 C)  TempSrc: Oral Oral Oral Oral  SpO2: 99% 97% 99% 99%  Weight:   (!) 131.5 kg   Height:       SpO2: 99 %   Intake/Output Summary (Last 24 hours) at 06/23/2020 1015 Last data filed at 06/23/2020 0950 Gross per 24 hour  Intake 1760.64 ml  Output 1000 ml  Net 760.64 ml   Filed Weights   06/21/20 1930 06/23/20 0101  Weight: (!) 131.5 kg (!) 131.5 kg    Exam: General exam: In no acute distress. Respiratory system: Good air movement and clear to auscultation. Cardiovascular system: S1 & S2 heard, RRR. No JVD. Gastrointestinal system: Abdomen is nondistended, soft and nontender.  Extremities: No pedal edema. Skin: No rashes, lesions or ulcers Psychiatry: Judgement and insight appear normal. Mood & affect appropriate.   Data Reviewed:    Labs: Basic Metabolic Panel: Recent Labs  Lab 06/21/20 2120 06/21/20 2120 06/22/20 0332 06/23/20 0036  NA 135  --  136 137  K 3.8   < > 3.7 3.8  CL 98  --  102 102  CO2 24  --  23 26  GLUCOSE 94  --  84 124*  BUN 5*  --  <5* <5*  CREATININE 0.93  --  0.81 0.87  CALCIUM  8.8*  --  8.7* 8.8*   < > = values in this interval not displayed.   GFR Estimated Creatinine Clearance: 188.9 mL/min (by C-G formula based on SCr of 0.87 mg/dL). Liver Function Tests: Recent Labs  Lab 06/21/20 2120  AST 20  ALT 33  ALKPHOS 123  BILITOT 0.6  PROT 8.7*  ALBUMIN 3.8   Recent Labs  Lab 06/21/20 2120  LIPASE 24   No results for input(s): AMMONIA in the last 168 hours. Coagulation profile Recent Labs  Lab 06/22/20 1504  INR 1.2   COVID-19 Labs  No results for input(s): DDIMER, FERRITIN, LDH, CRP in the last 72 hours.  Lab Results  Component Value Date   [place] NEGATIVE 06/21/2020    CBC: Recent Labs  Lab 06/21/20 2120 06/23/20 0036  WBC 15.0* 10.2  NEUTROABS 10.8*  --   HGB 13.5 12.2*  HCT 41.9 39.1  MCV 88.2 88.5  PLT 410* 397   Cardiac Enzymes: No results for input(s): CKTOTAL, CKMB, CKMBINDEX, TROPONINI in the last 168 hours. BNP (last 3 results) No results for input(s): PROBNP in the last 8760 hours. CBG: No results for input(s): GLUCAP in the last 168 hours. D-Dimer: No results for input(s): DDIMER in the last 72 hours. Hgb A1c: No results for input(s): HGBA1C in the last 72 hours. Lipid Profile: No results for input(s): CHOL, HDL, LDLCALC, TRIG, CHOLHDL, LDLDIRECT in the last 72 hours. Thyroid function studies: No results for input(s): TSH, T4TOTAL, T3FREE, THYROIDAB in the last 72 hours.  Invalid input(s): FREET3 Anemia work up: No results for input(s): VITAMINB12, FOLATE, FERRITIN, TIBC, IRON, RETICCTPCT in the last 72 hours. Sepsis Labs: Recent Labs  Lab 06/21/20 2120 06/22/20 0332 06/23/20 0036  WBC 15.0*  --  10.2  LATICACIDVEN  --  1.3  --    Microbiology Recent Results (from the past 240 hour(s))  SARS Coronavirus 2 by RT PCR (hospital order, performed in [facility] hospital lab) Nasopharyngeal Nasopharyngeal Swab     Status: None   Collection Time: 06/21/20 11:13 PM   Specimen: Nasopharyngeal Swab  Result  Value Ref Range Status   SARS Coronavirus 2 NEGATIVE NEGATIVE Final    Comment: (NOTE) SARS-CoV-2 target nucleic acids are NOT DETECTED.  The SARS-CoV-2 RNA is generally detectable in upper and lower respiratory specimens during the acute phase of infection. The lowest concentration of SARS-CoV-2 viral copies this assay can detect is 250 copies / mL. A negative result does not preclude SARS-CoV-2 infection and should not be used as the sole basis for treatment or other patient management decisions.  A negative result may occur with improper specimen collection / handling, submission of specimen other than nasopharyngeal swab, presence of viral mutation(s) within the areas targeted by this assay, and inadequate number of viral copies (<250 copies / mL). A negative result must be combined with clinical observations, patient history, and epidemiological information.  Fact Sheet for Patients:   StrictlyIdeas.no  Fact Sheet for Healthcare Providers: BankingDealers.co.za  This test is not yet approved or  cleared by the [place] FDA and has been authorized for detection and/or diagnosis of SARS-CoV-2 by FDA under an Emergency Use Authorization (EUA).  This EUA will remain in effect (meaning this test can be used) for the duration of the COVID-19 declaration under Section 564(b)(1) of the Act, 21 U.S.C. section 360bbb-3(b)(1), unless the authorization is terminated or revoked sooner.  Performed at [facility], [address] [place], [place] [address]      Medications:    Continuous Infusions: . sodium chloride 100 mL/hr at 06/23/20 3159  . heparin 2,400 Units/hr (06/23/20 0908)      LOS: 1 day   Charlynne Cousins  [facility]  06/23/2020, 10:15 AM

## 2020-06-23 NOTE — Progress Notes (Signed)
IVC/LEV studies been completed. Refer to [facility] under chart review to view preliminary results.   06/23/2020  10:58 AM Noah Moore, Bonnye Fava

## 2020-06-23 NOTE — Progress Notes (Signed)
ANTICOAGULATION CONSULT NOTE  Pharmacy Consult for Heparin Indication: DVT  No Known Allergies  Patient Measurements: Height: 6\' 4"  (193 cm) Weight: (!) 131.5 kg (290 lb) IBW/kg (Calculated) : 86.8 Heparin Dosing Weight: 115.4 kg  Vital Signs: Temp: 98.2 F (36.8 C) (07/29 0434) Temp Source: Oral (07/29 0434) BP: 119/78 (07/29 0434) Pulse Rate: 77 (07/29 0434)  Labs: Recent Labs    06/21/20 2120 06/21/20 2319 06/22/20 0332 06/22/20 3646 06/22/20 1504 06/23/20 0036 06/23/20 0042  HGB 13.5  --   --   --   --  12.2*  --   HCT 41.9  --   --   --   --  39.1  --   PLT 410*  --   --   --   --  397  --   APTT  --   --   --  55* 90* 65*  --   LABPROT  --   --   --   --  14.9  --   --   INR  --   --   --   --  1.2  --   --   HEPARINUNFRC  --  1.70*  --  1.06*  --   --  0.39  CREATININE 0.93  --  0.81  --   --  0.87  --     Estimated Creatinine Clearance: 188.9 mL/min (by C-G formula based on SCr of 0.87 mg/dL).   Assessment: 40 YOM with history of LLE DVT in April 2021 - started on Xarelto however required thrombectomy in May 2021 and transitioned to Apixaban at that time. Imaging shows recurrent LLE DVT this admission and pharmacy consulted to bridge with Heparin while awaiting [facility] plans for procedures/interventions.   Dosing Heparin based off of aPTTs due to false elevation of heparin in setting of recent Apixaban. The patient's aPTT this morning is SUPRAtherapeutic after a rate adjustment earlier today (aPTT 121 << 65, goal of 66-102).   The patient had already left for the OR today - will plan to make a rate adjustment and f/u on anticoagulation plans post-op.   Goal of Therapy:  aPTT 66-102 seconds Heparin level 0.3-0.7 Monitor platelets by anticoagulation protocol: Yes   Plan:  - F/u on anticoagulation plans post-op - Will need a rate reduction from AM rate if/when continued  Thank you for allowing pharmacy to be a part of this patient's care.  Alycia Rossetti, PharmD, BCPS Clinical Pharmacist Clinical phone for 06/23/2020: [phone] 06/23/2020 1:47 PM   **Pharmacist phone directory can now be found on [email or web address] ([email or web address]).  Listed under [place].

## 2020-06-23 NOTE — Progress Notes (Signed)
[facility]  Subjective  - no acute events.   Objective 119/78 77 98.2 F (36.8 C) (Oral) 20 99%  Intake/Output Summary (Last 24 hours) at 06/23/2020 1032 Last data filed at 06/23/2020 0950 Gross per 24 hour  Intake 1760.64 ml  Output 1000 ml  Net 760.64 ml    Left leg edema  Laboratory Lab Results: Recent Labs    06/21/20 2120 06/23/20 0036  WBC 15.0* 10.2  HGB 13.5 12.2*  HCT 41.9 39.1  PLT 410* 397   BMET Recent Labs    06/22/20 0332 06/23/20 0036  NA 136 137  K 3.7 3.8  CL 102 102  CO2 23 26  GLUCOSE 84 124*  BUN <5* <5*  CREATININE 0.81 0.87  CALCIUM 8.7* 8.8*    COAG Lab Results  Component Value Date   INR 1.2 06/22/2020   No results found for: PTT  Assessment/Planning:  Plan left leg IVUS, possible venogram, possible lysis, possible percutaneous mechanical thrombectomy.  Extensive left leg DVT.  As noted yesterday -  underwent left lower extremity percutaneous mechanical thrombectomy in May 2021 with mostly chronic thrombus in the popliteal and superficial femoral vein.  At that time on IVUS there was no evidence of May Thurner and he had no thrombus in the IVC, common iliac and external iliac vein on the left.  He states he has been mostly compliant with his Eliquis except for missing 3-4 doses sporadically.  He had left lower abdominal pain that started on Thursday last week and now has what looks like a fairly large acute on chronic DVT extending into the left iliac vein and IVC.  Unclear to me as to the etiology and I think we should engage hematology oncology who was previously following him and certainly raises other questions about the etiology.   Marty Heck 06/23/2020 10:32 AM --

## 2020-06-23 NOTE — Progress Notes (Addendum)
ANTICOAGULATION CONSULT NOTE  Pharmacy Consult for Heparin Indication: DVT  No Known Allergies  Patient Measurements: Height: 6\' 4"  (193 cm) Weight: (!) 131.5 kg (290 lb) IBW/kg (Calculated) : 86.8 Heparin Dosing Weight: 115.4 kg  Vital Signs: Temp: 98.2 F (36.8 C) (07/29 0434) Temp Source: Oral (07/29 0434) BP: 139/89 (07/29 1400) Pulse Rate: 88 (07/29 1400)  Labs: Recent Labs    06/21/20 2120 06/21/20 2319 06/22/20 0332 06/22/20 1121 06/22/20 6244 06/22/20 1504 06/23/20 0036 06/23/20 0042 06/23/20 0816  HGB 13.5  --   --   --   --   --  12.2*  --   --   HCT 41.9  --   --   --   --   --  39.1  --   --   PLT 410*  --   --   --   --   --  397  --   --   APTT  --   --   --  55*   < > 90* 65*  --  121*  LABPROT  --   --   --   --   --  14.9  --   --   --   INR  --   --   --   --   --  1.2  --   --   --   HEPARINUNFRC  --  1.70*  --  1.06*  --   --   --  0.39  --   CREATININE 0.93  --  0.81  --   --   --  0.87  --   --    < > = values in this interval not displayed.    Estimated Creatinine Clearance: 188.9 mL/min (by C-G formula based on SCr of 0.87 mg/dL).  . sodium chloride flush  3 mL Intravenous Q12H   Assessment: 79 YOM with history of LLE DVT in April 2021 - started on Xarelto however required thrombectomy in May 2021 and transitioned to Apixaban at that time. Imaging shows recurrent LLE DVT this admission and pharmacy consulted to bridge with Heparin while awaiting VVS plans for procedures/interventions. Last dose of apixaban was 7/27 at 8am  He is now s/p OR with TPA at 0.25 mg/hr and heparin at 800 units/hr through the sheath. Heparin rate prior to procedure was 2400 units/hr  Goal of Therapy:  aPTT 66-102 seconds Heparin level 0.3-0.7 Monitor platelets by anticoagulation protocol: Yes   Plan:  -Increase heparin to 2000 units/hr (I spoke with Dr. Carlis Abbott) -aPTT and heparin level in 6 hours -Daily heparin level, CBC  -Plans for lysis check 7/30

## 2020-06-23 NOTE — Consult Note (Signed)
Noah Moore  [phone] [phone]) [place]  Referring MD: Dr. 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 [place] LE showed acute deep vein thrombosis involving the left common femoral vein, left femoral vein, and left popliteal vein. Started on anticoagulation therapy. Admitted 4/21-4/24/2021. D/c on Xarelto 2) 03/17/2020: CTA shows no evidence of PE 3) 03/30/2020: patient presented to ED with worsening pain. Admitted again and underwent peripheral vascular thrombectomy. Transitioned to apixaban 4) 04/11/2020: establish care with Dr. Lorenso Courier  5) 06/21/2020: Hospital admission for recurrent, extensive DVT of left leg  HPI: Mr. Noah Moore is a 29 year old male with a past medical history significant only for a left lower extremity DVT.  He presented to the emergency room for evaluation of lower abdominal and proximal left leg pain, fevers, and chills.  The patient has been on Eliquis for his diagnosis of left lower extremity DVT.  He reported a vascular surgery that he may have missed 3-4 doses over the past few months but none of these missed doses have been in the past 2 weeks or consecutively. He developed pain in the lower abdomen and proximal left leg pain about 5 days prior to admission.  He reported subjective fevers and chills and was seen on 06/20/2020 and was told that he had not had a UTI or kidney stones.  Since the pain was persistent and worsening, he came to the emergency room for further evaluation.  He has not been having any chest pain, shortness of breath, hemoptysis, or swelling in his lower extremities.  In the emergency room, his CBC was remarkable for a WBC of 15,000 and platelet count of 410,000.  COVID-19 testing negative.  CT of the abdomen pelvis showed a large DVT  extending from the left profundus femoral vein to the junction of the common iliac veins and likely into the inferior aspect of the IVC.  There was stranding surrounding the left iliac venous system which may be reactive or represent thrombophlebitis.  Doppler ultrasound of the bilateral lower extremities showed no DVT on the right but did show findings consistent with acute DVT involving the left common femoral vein and left popliteal vein as well as findings consistent with age-indeterminate DVT involving the left femoral vein and left proximal profunda vein.  There was no evidence of thrombus involving the IVC but there was acute thrombus involving the left common iliac vein and the left external iliac vein.  The patient has been started on IV heparin.  He was taken to the Cath Lab on 06/23/2020 for left leg IVUS of left external iliac vein, left common iliac vein, and IVC and thrombolysis of the left external iliac vein, common iliac vein, and IVC.  When seen today, the patient reports ongoing abdominal discomfort.  He has not been having any chest pain or shortness of breath.  He reports no swelling of his left leg.  He does report that it is more uncomfortable to walk on his left leg and he notices a rash on his left lower extremity which is tender.  He has had this rash before and it was present around the time that he was first diagnosed with a DVT.  He is not having any headaches or dizziness.Friend at the bedside indicates that he has had recurrent mouth sores over the past few months as  well as a decreased energy level and more fatigue.  He reports having drenching night sweats most nights. Reports that he has loose stools that are typically bloody.  He states that he is not straining.  He denies any other bleeding such as epistaxis, hemoptysis, hematemesis, hematuria.  He again tells me today that he has missed about 4 doses of his Eliquis over the roughly 52-monthperiod of time that he has been on the  medication.  He states that he has not missed any doses in the past 2 weeks.  He has not had any recent prolonged travel or immobility.  Hematology was asked to see the patient to make recommendations regarding recurrent DVT.   Past Medical History:  Diagnosis Date   DVT (deep venous thrombosis) (HClive 02/2020  :    Past Surgical History:  Procedure Laterality Date   THROMBECTOMY  03/2020  :   CURRENT MEDS: Current Facility-Administered Medications  Medication Dose Route Frequency Provider Last Rate Last Admin   0.9 %  sodium chloride infusion   Intravenous Continuous CMarty Heck MD 100 mL/hr at 06/23/20 0642 New Bag at 06/23/20 0642   0.9 %  sodium chloride infusion    Continuous PRN CMarty Heck MD 10 mL/hr at 06/23/20 1216 10 mL/hr at 06/23/20 1216   [MAR Hold] acetaminophen (TYLENOL) tablet 650 mg  650 mg Oral Q6H PRN Opyd, TIlene Qua MD       Or   [Doug SouHold] acetaminophen (TYLENOL) suppository 650 mg  650 mg Rectal Q6H PRN Opyd, TIlene Qua MD       alteplase (LIMB ISCHEMIA) 10 mg in normal saline (0.02 mg/mL) infusion  0.25 mg/hr Intracatheter Continuous CMarty Heck MD 12.5 mL/hr at 06/23/20 1314 0.25 mg/hr at 06/23/20 1314   [MAR Hold] bisacodyl (DULCOLAX) EC tablet 5 mg  5 mg Oral Daily PRN Opyd, TIlene Qua MD       fentaNYL (SUBLIMAZE) injection    PRN CMarty Heck MD   25 mcg at 06/23/20 1238   Heparin (Porcine) in NaCl 1000-0.9 UT/500ML-% SOLN    PRN CMarty Heck MD   500 mL at 06/23/20 1116   heparin ADULT infusion 100 units/mL (25000 units/2517msodium chloride 0.45%)  2,400 Units/hr Intravenous Continuous FeCharlynne CousinsMD 8 mL/hr at 06/23/20 1256 800 Units/hr at 06/23/20 1256   [MAR Hold] HYDROcodone-acetaminophen (NORCO/VICODIN) 5-325 MG per tablet 1-2 tablet  1-2 tablet Oral Q4H PRN Opyd, TiIlene QuaMD   2 tablet at 06/23/20 0905   iodixanol (VISIPAQUE) 320 MG/ML injection    PRN ClMarty HeckMD    55 mL at 06/23/20 1252   lidocaine (PF) (XYLOCAINE) 1 % injection    PRN ClMarty HeckMD   15 mL at 06/23/20 1219   midazolam (VERSED) injection    PRN ClMarty HeckMD   2 mg at 06/23/20 1238   [MAR Hold] morphine 4 MG/ML injection 4 mg  4 mg Intravenous Q4H PRN Opyd, TiIlene QuaMD   4 mg at 06/23/20 0642   [MAR Hold] ondansetron (ZOFRAN) tablet 4 mg  4 mg Oral Q6H PRN Opyd, TiIlene QuaMD       Or   [MDoug Souold] ondansetron (ZOFRAN) injection 4 mg  4 mg Intravenous Q6H PRN Opyd, TiIlene QuaMD       [MAR Hold] senna-docusate (Senokot-S) tablet 1 tablet  1 tablet Oral QHS PRN Opyd, TiIlene QuaMD  No Known Allergies:  Family History  Problem Relation Age of Onset   Venous thrombosis Neg Hx   :  Social History   Socioeconomic History   Marital status: Single    Spouse name: Not on file   Number of children: Not on file   Years of education: Not on file   Highest education level: Not on file  Occupational History   Not on file  Tobacco Use   Smoking status: Former Smoker    Types: E-cigarettes   Smokeless tobacco: Never Used  Substance and Sexual Activity   Alcohol use: Not Currently   Drug use: Not Currently   Sexual activity: Not on file  Other Topics Concern   Not on file  Social History Narrative   Not on file   Social Determinants of Health   Financial Resource Strain:    Difficulty of Paying Living Expenses:   Food Insecurity:    Worried About Charity fundraiser in the Last Year:    Arboriculturist in the Last Year:   Transportation Needs:    Film/video editor (Medical):    Lack of Transportation (Non-Medical):   Physical Activity:    Days of Exercise per Week:    Minutes of Exercise per Session:   Stress:    Feeling of Stress :   Social Connections:    Frequency of Communication with Friends and Family:    Frequency of Social Gatherings with Friends and Family:    Attends Religious Services:     Active Member of Clubs or Organizations:    Attends Music therapist:    Marital Status:   Intimate Partner Violence:    Fear of Current or Ex-Partner:    Emotionally Abused:    Physically Abused:    Sexually Abused:   :  REVIEW OF SYSTEMS:  A comprehensive 14 point review of systems was negative except as noted in the HPI.    Exam: Patient Vitals for the past 24 hrs:  BP Temp Temp src Pulse Resp SpO2 Weight  06/23/20 1311 -- -- -- (!) 0 (!) 0 (!) 0 % --  06/23/20 1306 -- -- -- (!) 112 (!) 0 (!) 0 % --  06/23/20 1256 (!) 136/88 -- -- 88 20 100 % --  06/23/20 1251 (!) 136/83 -- -- 93 17 99 % --  06/23/20 1246 (!) 143/91 -- -- 90 14 99 % --  06/23/20 1241 (!) 149/94 -- -- 89 19 100 % --  06/23/20 1236 (!) 143/90 -- -- 98 19 98 % --  06/23/20 1232 (!) 168/141 -- -- (!) 111 18 100 % --  06/23/20 1231 -- -- -- (!) 112 23 98 % --  06/23/20 1228 (!) 129/89 -- -- 103 17 98 % --  06/23/20 1227 (!) 156/82 -- -- 95 16 99 % --  06/23/20 1223 (!) 130/92 -- -- 97 17 100 % --  06/23/20 1218 (!) 138/87 -- -- 97 16 100 % --  06/23/20 1213 (!) 140/88 -- -- 93 -- 100 % --  06/23/20 1208 -- -- -- 97 15 (!) 0 % --  06/23/20 1204 -- -- -- (!) 0 (!) 9 (!) 0 % --  06/23/20 1159 (!) 130/100 -- -- 99 19 100 % --  06/23/20 1159 -- -- -- 105 20 100 % --  06/23/20 1154 (!) 146/104 -- -- 104 -- 98 % --  06/23/20 1149 (!) 136/106 -- -- (!) 113 -- 100 % --  06/23/20 1144 (!) 142/96 -- -- 98 17 100 % --  06/23/20 1139 (!) 135/100 -- -- 96 -- 100 % --  06/23/20 1129 (!) 137/99 -- -- 98 (!) 11 100 % --  06/23/20 1124 (!) 127/89 -- -- 90 17 100 % --  06/23/20 1119 (!) 131/90 -- -- 94 (!) 9 100 % --  06/23/20 1115 -- -- -- -- -- 100 % --  06/23/20 1115 (!) 137/97 -- -- 86 -- 100 % --  06/23/20 1114 -- -- -- 95 -- -- --  06/23/20 0434 119/78 98.2 F (36.8 C) Oral 77 20 99 % --  06/23/20 0101 (!) 113/61 99.5 F (37.5 C) Oral (!) 108 20 99 % (!) 131.5 kg  06/22/20 2008 120/84 97.6 F (36.4  C) Oral 85 20 97 % --    General:  well-nourished in no acute distress.   Eyes:  no scleral icterus.   ENT:  There were no oropharyngeal lesions.   Neck was without thyromegaly.   Lymphatics:  Negative cervical, supraclavicular or axillary adenopathy.   Respiratory: lungs were clear bilaterally without wheezing or crackles.   Cardiovascular:  Regular rate and rhythm, S1/S2, without murmur, rub or gallop.  There was no pedal edema.   GI:  abdomen was soft, flat, nontender, nondistended, without organomegaly.     Skin: Faint rash to left lower extremity, no ecchymoses or petechiae. Neuro exam was nonfocal.  Patient was alert and oriented.  Attention was good.   Language was appropriate.  Mood was normal without depression.  Speech was not pressured.  Thought content was not tangential.    LABS:  Lab Results  Component Value Date   WBC 10.2 06/23/2020   HGB 12.2 (L) 06/23/2020   HCT 39.1 06/23/2020   PLT 397 06/23/2020   GLUCOSE 124 (H) 06/23/2020   ALT 33 06/21/2020   AST 20 06/21/2020   NA 137 06/23/2020   K 3.8 06/23/2020   CL 102 06/23/2020   CREATININE 0.87 06/23/2020   BUN <5 (L) 06/23/2020   CO2 26 06/23/2020   INR 1.2 06/22/2020    CT ABDOMEN PELVIS W CONTRAST  Result Date: 06/21/2020 CLINICAL DATA:  29 year old male with lower abdominal pain. Concern for acute diverticulitis. EXAM: CT ABDOMEN AND PELVIS WITH CONTRAST TECHNIQUE: Multidetector CT imaging of the abdomen and pelvis was performed using the standard protocol following bolus administration of intravenous contrast. CONTRAST:  117m OMNIPAQUE IOHEXOL 300 MG/ML  SOLN COMPARISON:  None. FINDINGS: Lower chest: The visualized lung bases are clear. No intra-abdominal free air or free fluid. Hepatobiliary: Apparent mild fatty liver. No intrahepatic biliary ductal dilatation. The gallbladder is unremarkable. Pancreas: Unremarkable. No pancreatic ductal dilatation or surrounding inflammatory changes. Spleen: Normal in size  without focal abnormality. Adrenals/Urinary Tract: The adrenal glands are unremarkable. There is no hydronephrosis on either side. Punctate nonobstructing left renal upper pole calculus. Subcentimeter left renal hypodense lesion is too small to characterize. The visualized ureters and urinary bladder appear unremarkable. Stomach/Bowel: There is no bowel obstruction or active inflammation. The appendix is normal. Vascular/Lymphatic: The abdominal aorta is unremarkable. There is large thrombus extending from the left profundus femoral vein to the junction of the common iliac veins and likely inferior aspect of the IVC. There is distension of the left common iliac vein, left internal and external iliac veins, and left femoral vein with surrounding inflammatory changes in keeping with large thrombus. The thrombus appears almost occlusive in the left common iliac vein. There is edema  surrounding the left iliac venous system which may represent thrombophlebitis. Clinical correlation is recommended. The main portal vein appears patent. No portal venous gas. There is no adenopathy. Reproductive: The prostate and seminal vesicles are grossly unremarkable. Other: Edema of the left lateral pelvic sidewall and presacral space secondary to large DVT. Musculoskeletal: No acute or significant osseous findings. IMPRESSION: 1. Large DVT extending from the LEFT profundus femoral vein to the junction of the common iliac veins and likely into the inferior aspect of the IVC. There is stranding surrounding the left iliac venous system which may be reactive or represent thrombophlebitis. Clinical correlation is recommended. 2. No bowel obstruction. Normal appendix. 3. Punctate nonobstructing left renal upper pole calculus. No hydronephrosis. These results were called by telephone at the time of interpretation on 06/21/2020 at 10:23 pm to provider Veryl Speak , who verbally acknowledged these results. Electronically Signed   By: Anner Crete M.D.   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  Ultrasound-guided access of left popliteal vein 2.  Ultrasound-guided access of left common femoral vein 3.  Left femoral and iliac venogram 4.  IVUS of left external iliac vein, left common iliac vein and IVC 5.  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  He recently presented here with now worsening symptoms and evidence of acute DVT in the left iliac and IVC that is new.  He presents for planned evaluation with venogram IVUS and possible thrombolysis.  Findings:  Initially accessed the left popliteal in the prone position and ultimately I could not get across the left common femoral vein occlusion from popliteal access and this appeared chronic with large collaterals.  I left my sheath and flipped him over and accessed left common femoral vein and again could not get a wire to track.  I was able to stick higher on the left common femoral0 vein and finally got a wire into the external iliac common iliac vein and into the IVC.  On IVUS there appears to be acute on chronic thrombus in the left external and common iliac vein and distal IVC.  A thrombolytic's catheter was placed.             Procedure:  The patient was identified in the holding area and taken to room 8.  The patient was then placed prone on the table and  prepped and draped in the usual sterile fashion.  A time out was called.  Ultrasound was used to evaluate the left popliteal vein.  Initially accessed the left popliteal vein with a micro needle placed a microwire.  I could not get this wire to track across the popliteal thrombus and suspected this was chronic and met significant resistance.  Finally I was able to get in the small saphenous and got a wire and micro sheath in place and finally placed an  8 French sheath and tried to get a Glidewire advantage up the femoral vein but could not get across the common femoral vein occlusion.  Venogram showed large collateral and this appeared chronic.  I then flipped him over both groins were prepped and draped and accessed the left common femoral vein and got venous backbleeding but again could not get a wire to track and suspect this was chronically occluded and it was not until I accessed the vein much higher that I got a wire into the iliac vein without significant resistance.  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  Subsequently did perform venogram to assure there was no extravasation prior to initiation of lysis.  I then placed a thrombolytics catheter in the left external and common iliac vein into the IVC for a total treatment length of 30 cm.  All sheaths were secured and be taken to ICU.  Plan: Lytics check tomorrow. Run TPA at 0.25 mg/hr and heparin at 800 units/hr through the sheath.     Marty Heck, MD [facility] Office: [phone]   VAS [place] IVC/ILIAC (VENOUS ONLY)  Result Date: 06/23/2020 IVC/ILIAC STUDY Indications: History of left lower ext. DVT extending into the iliac veins and              possible IVC on CT scan dated 06/21/20  Performing Technologist: Oda Cogan RDMS, RVT   Examination Guidelines: A complete evaluation includes B-mode imaging, spectral Doppler, color Doppler, and power Doppler as needed of all accessible portions of each vessel. Bilateral testing is considered an integral part of a complete examination. Limited examinations for reoccurring indications may be performed as noted.  IVC/Iliac Findings: +----------+------+--------+------------------------------------------------+     IVC     Patent Thrombus                     Comments                      +----------+------+--------+------------------------------------------------+  IVC Prox   patent                                                            +----------+------+--------+------------------------------------------------+  IVC Mid    patent                                                            +----------+------+--------+------------------------------------------------+  IVC Distal patent          appears patent, cannot exclude partial thrombus.  +----------+------+--------+------------------------------------------------+  +-------------------+---------+-----------+---------+-----------+--------+          CIV         RT-Patent RT-Thrombus LT-Patent LT-Thrombus Comments  +-------------------+---------+-----------+---------+-----------+--------+  Common Iliac Prox    patent                patent                         +-------------------+---------+-----------+---------+-----------+--------+  Common Iliac Mid     patent                patent                         +-------------------+---------+-----------+---------+-----------+--------+  Common Iliac Distal  patent                patent                         +-------------------+---------+-----------+---------+-----------+--------+  +-------------------------+---------+-----------+---------+-----------+--------+             EIV            RT-Patent RT-Thrombus LT-Patent LT-Thrombus Comments   +-------------------------+---------+-----------+---------+-----------+--------+  External Iliac Vein Prox               acute                 acute              +-------------------------+---------+-----------+---------+-----------+--------+  External Iliac Vein Mid                acute                 acute              +-------------------------+---------+-----------+---------+-----------+--------+  External Iliac Vein                    acute                 acute               Distal                                                                          +-------------------------+---------+-----------+---------+-----------+--------+   Summary: IVC/Iliac: There is no evidence of thrombus involving the IVC. There is evidence of acute thrombus involving the left common iliac vein. There is evidence of acute thrombus involving the left external iliac vein.  *See table(s) above for measurements and observations.   Preliminary    VAS [place] LOWER EXTREMITY VENOUS (DVT)  Result Date: 06/23/2020  Lower Venous DVTStudy Indications: Swelling, and History of left leg DVT found on CT scan dated 7/27.  Risk Factors: DVT left. Comparison Study: No prior. Performing Technologist: Oda Cogan RDMS, RVT  Examination Guidelines: A complete evaluation includes B-mode imaging, spectral Doppler, color Doppler, and power Doppler as needed of all accessible portions of each vessel. Bilateral testing is considered an integral part of a complete examination. Limited examinations for reoccurring indications may be performed as noted. The reflux portion of the exam is performed with the patient in reverse Trendelenburg.  +-----+---------------+---------+-----------+----------+--------------+  RIGHT Compressibility Phasicity Spontaneity Properties Thrombus Aging  +-----+---------------+---------+-----------+----------+--------------+  CFV   Full            No        No                                      +-----+---------------+---------+-----------+----------+--------------+   +---------+---------------+---------+-----------+----------+-----------------+  LEFT  Compressibility Phasicity Spontaneity Properties Thrombus Aging     +---------+---------------+---------+-----------+----------+-----------------+  CFV       None            No        No                     Acute              +---------+---------------+---------+-----------+----------+-----------------+  SFJ       Full                                                                +---------+---------------+---------+-----------+----------+-----------------+  FV Prox   Partial                                          Chronic            +---------+---------------+---------+-----------+----------+-----------------+  FV Mid    Full                                                                +---------+---------------+---------+-----------+----------+-----------------+  FV Distal Full                                                                +---------+---------------+---------+-----------+----------+-----------------+  PFV       Partial                                          Age Indeterminate  +---------+---------------+---------+-----------+----------+-----------------+  POP       None            No        No                     Age Indeterminate  +---------+---------------+---------+-----------+----------+-----------------+  PTV       Full                                                                +---------+---------------+---------+-----------+----------+-----------------+  PERO      Full                                                                +---------+---------------+---------+-----------+----------+-----------------+  Summary: RIGHT: - No evidence of common femoral vein obstruction.  LEFT: - Findings consistent with acute deep vein thrombosis involving the left common femoral vein, and left popliteal vein. - Findings  consistent with age indeterminate deep vein thrombosis involving the left femoral vein, and left proximal profunda vein.  *See table(s) above for measurements and observations.    Preliminary      ASSESSMENT AND PLAN:  Noah Moore 29 y.o. male with medical history significant for extensive provoked LLE DVT who is currently on Eliquis 5 mg twice daily.  Now presenting with recurrent, extensive DVT of the left leg.  The patient has been mostly adherent to his Eliquis and has missed about 4 doses over the past 3 months, but none in the past 2 weeks.  No recent prolonged travel or immobility.  He underwent IVUS of the left external iliac vein, left common iliac vein, and IVC as well as initiation of thrombolysis of the left external iliac vein, common iliac vein, and IVC earlier today.  Still having left lower abdominal pain.  #Recurrent Extensive Left Lower Extremity DVT --The patient was initially diagnosed with a provoked left leg DVT and has been on Eliquis with no recent missed doses --Now with significant left lower abdominal pain and found to have extensive, recurrent DVT in the left leg --Hypercoagulable work-up was performed on 03/17/2020 and was unrevealing except for an elevated PTT lupus anticoagulant and DRVVT.  Recommendation is to repeat this at least 12 weeks later to confirm.  Will discuss with Dr. Lorenso Courier need for repeat lab. --For now, continue heparin.  We will need to switch anticoagulation pending further work-up.  #Rectal Bleeding --findings are most consistent with internal hemorrhoids  --Hemoglobin only mildly decreased, monitor --Iron studies, ferritin, reticulocytes were checked in our office less than a week ago  and were unrevealing --It was recommended for him to eat more iron rich foods.  Thank you for this referral.  Mikey Bussing, DNP, AGPCNP-BC, AOCNP Mon/Tues/Thurs/Fri 7am-5pm; Off Wednesdays Cell: [phone]

## 2020-06-24 ENCOUNTER — Encounter (HOSPITAL_COMMUNITY)
Admission: EM | Disposition: A | Discharge status: Home / Self Care | Payer: Self-pay | Source: Home / Self Care | Attending: Internal Medicine

## 2020-06-24 ENCOUNTER — Encounter (HOSPITAL_COMMUNITY): Payer: Self-pay | Admitting: Vascular Surgery

## 2020-06-24 HISTORY — PX: PERIPHERAL VASCULAR THROMBECTOMY: CATH118306

## 2020-06-24 HISTORY — PX: INTRAVASCULAR ULTRASOUND/IVUS: CATH118244

## 2020-06-24 LAB — HEPARIN LEVEL (UNFRACTIONATED)
Heparin Unfractionated: 0.1 IU/mL — ABNORMAL LOW (ref 0.30–0.70)
Heparin Unfractionated: 0.31 IU/mL (ref 0.30–0.70)

## 2020-06-24 LAB — CBC
HCT: 38.8 % — ABNORMAL LOW (ref 39.0–52.0)
Hemoglobin: 12.1 g/dL — ABNORMAL LOW (ref 13.0–17.0)
MCH: 27.4 pg (ref 26.0–34.0)
MCHC: 31.2 g/dL (ref 30.0–36.0)
MCV: 87.8 fL (ref 80.0–100.0)
Platelets: 426 10*3/uL — ABNORMAL HIGH (ref 150–400)
RBC: 4.42 MIL/uL (ref 4.22–5.81)
RDW: 13.9 % (ref 11.5–15.5)
WBC: 10.6 10*3/uL — ABNORMAL HIGH (ref 4.0–10.5)
nRBC: 0 % (ref 0.0–0.2)

## 2020-06-24 LAB — BASIC METABOLIC PANEL
Anion gap: 11 (ref 5–15)
BUN: <5 mg/dL — ABNORMAL LOW (ref 6–20)
CO2: 22 mmol/L (ref 22–32)
Calcium: 8.7 mg/dL — ABNORMAL LOW (ref 8.9–10.3)
Chloride: 98 mmol/L (ref 98–111)
Creatinine, Ser: 0.84 mg/dL (ref 0.61–1.24)
GFR calc Af Amer: >60 mL/min (ref >60)
GFR calc non Af Amer: >60 mL/min (ref >60)
Glucose, Bld: 115 mg/dL — ABNORMAL HIGH (ref 70–99)
Potassium: 3.7 mmol/L (ref 3.5–5.1)
Sodium: 131 mmol/L — ABNORMAL LOW (ref 135–145)

## 2020-06-24 LAB — SURGICAL PCR SCREEN
MRSA, PCR: NEGATIVE
Staphylococcus aureus: NEGATIVE

## 2020-06-24 LAB — FIBRINOGEN: Fibrinogen: 770 mg/dL — ABNORMAL HIGH (ref 210–475)

## 2020-06-24 LAB — POCT ACTIVATED CLOTTING TIME
Activated Clotting Time: 180 seconds
Activated Clotting Time: 0 seconds
Activated Clotting Time: 219 seconds

## 2020-06-24 LAB — APTT: aPTT: 50 seconds — ABNORMAL HIGH (ref 24–36)

## 2020-06-24 SURGERY — PERIPHERAL VASCULAR THROMBECTOMY
Anesthesia: LOCAL

## 2020-06-24 MED ORDER — HEPARIN SODIUM (PORCINE) 1000 UNIT/ML IJ SOLN
INTRAMUSCULAR | Status: DC | PRN
  Administered 2020-06-24: 8000 [IU] via INTRAVENOUS
  Administered 2020-06-24: 5000 [IU] via INTRAVENOUS

## 2020-06-24 MED ORDER — FENTANYL CITRATE (PF) 100 MCG/2ML IJ SOLN
INTRAMUSCULAR | Status: DC | PRN
  Administered 2020-06-24 (×3): 50 ug via INTRAVENOUS

## 2020-06-24 MED ORDER — HEPARIN (PORCINE) IN NACL 1000-0.9 UT/500ML-% IV SOLN
INTRAVENOUS | Status: AC
  Filled 2020-06-24: qty 500

## 2020-06-24 MED ORDER — HEPARIN SODIUM (PORCINE) 1000 UNIT/ML IJ SOLN
INTRAMUSCULAR | Status: AC
  Filled 2020-06-24: qty 1

## 2020-06-24 MED ORDER — HYDROMORPHONE HCL 1 MG/ML IJ SOLN
INTRAMUSCULAR | Status: AC
  Filled 2020-06-24: qty 0.5

## 2020-06-24 MED ORDER — MIDAZOLAM HCL 2 MG/2ML IJ SOLN
INTRAMUSCULAR | Status: AC
  Filled 2020-06-24: qty 2

## 2020-06-24 MED ORDER — MIDAZOLAM HCL 2 MG/2ML IJ SOLN
INTRAMUSCULAR | Status: DC | PRN
  Administered 2020-06-24 (×2): 2 mg via INTRAVENOUS

## 2020-06-24 MED ORDER — FENTANYL CITRATE (PF) 100 MCG/2ML IJ SOLN
INTRAMUSCULAR | Status: AC
  Filled 2020-06-24: qty 2

## 2020-06-24 MED ORDER — HYDROXYZINE HCL 10 MG PO TABS
10.0000 mg | ORAL_TABLET | Freq: Three times a day (TID) | ORAL | Status: DC | PRN
  Administered 2020-06-24 – 2020-06-26 (×3): 10 mg via ORAL
  Filled 2020-06-24 (×6): qty 1

## 2020-06-24 MED ORDER — ONDANSETRON HCL 4 MG/2ML IJ SOLN
INTRAMUSCULAR | Status: AC
  Filled 2020-06-24: qty 2

## 2020-06-24 MED ORDER — IODIXANOL 320 MG/ML IV SOLN
INTRAVENOUS | Status: DC | PRN
  Administered 2020-06-24: 60 mL via INTRAVENOUS

## 2020-06-24 MED ORDER — HYDROMORPHONE HCL 1 MG/ML IJ SOLN
INTRAMUSCULAR | Status: DC | PRN
  Administered 2020-06-24 (×2): 0.5 mg via INTRAVENOUS

## 2020-06-24 MED ORDER — HEPARIN (PORCINE) IN NACL 1000-0.9 UT/500ML-% IV SOLN
INTRAVENOUS | Status: DC | PRN
  Administered 2020-06-24 (×2): 500 mL

## 2020-06-24 MED ORDER — HYDROXYZINE HCL 10 MG/5ML PO SYRP
10.0000 mg | ORAL_SOLUTION | Freq: Three times a day (TID) | ORAL | Status: DC | PRN
  Filled 2020-06-24: qty 5

## 2020-06-24 MED ORDER — ONDANSETRON HCL 4 MG/2ML IJ SOLN
INTRAMUSCULAR | Status: DC | PRN
  Administered 2020-06-24: 4 mg via INTRAVENOUS

## 2020-06-24 MED ORDER — LIDOCAINE HCL (PF) 1 % IJ SOLN
INTRAMUSCULAR | Status: DC | PRN
  Administered 2020-06-24 (×2): 20 mL

## 2020-06-24 SURGICAL SUPPLY — 12 items
BAG SNAP BAND KOVER 36X36 (MISCELLANEOUS) ×2 IMPLANT
BALLN MUSTANG 12X80X75 (BALLOONS) ×2
BALLOON MUSTANG 12X80X75 (BALLOONS) ×1 IMPLANT
CATH RETRIEVER CLOT 16MMX105CM (CATHETERS) ×2 IMPLANT
CATH VISIONS PV .035 IVUS (CATHETERS) ×2 IMPLANT
COVER DOME SNAP 22 D (MISCELLANEOUS) ×2 IMPLANT
GLIDEWIRE ADV .035X260CM (WIRE) ×2 IMPLANT
KIT ENCORE 26 ADVANTAGE (KITS) ×2 IMPLANT
PROTECTION STATION PRESSURIZED (MISCELLANEOUS) ×2
SHEATH CLOT RETRIEVER (SHEATH) ×2 IMPLANT
STATION PROTECTION PRESSURIZED (MISCELLANEOUS) ×1 IMPLANT
TRAY PV CATH (CUSTOM PROCEDURE TRAY) ×2 IMPLANT

## 2020-06-24 NOTE — Progress Notes (Addendum)
ANTICOAGULATION CONSULT NOTE  Pharmacy Consult for Heparin Indication: DVT  No Known Allergies  Patient Measurements: Height: 6\' 4"  (193 cm) Weight: (!) 131.4 kg (289 lb 11 oz) IBW/kg (Calculated) : 86.8 Heparin Dosing Weight: 115.4 kg  Vital Signs: Temp: 98.6 F (37 C) (07/30 1419) Temp Source: Oral (07/30 1419) BP: 124/84 (07/30 1419) Pulse Rate: 92 (07/30 1419)  Labs: Recent Labs    06/21/20 2120 06/22/20 0332 06/22/20 0277 06/22/20 1504 06/23/20 0036 06/23/20 0042 06/23/20 0816 06/23/20 2102 06/24/20 0058 06/24/20 1521  HGB   < >  --   --   --  12.2*   < >  --  13.3 12.1*  --   HCT   < >  --   --   --  39.1  --   --  42.2 38.8*  --   PLT   < >  --   --   --  397  --   --  402* 426*  --   APTT   < >  --    < > 90* 65*   < > 121* 36 50*  --   LABPROT  --   --   --  14.9  --   --   --   --   --   --   INR  --   --   --  1.2  --   --   --   --   --   --   HEPARINUNFRC  --   --    < >  --   --    < >  --  <0.10* 0.10* 0.31  CREATININE  --  0.81  --   --  0.87  --   --   --  0.84  --    < > = values in this interval not displayed.    Estimated Creatinine Clearance: 195.4 mL/min (by C-G formula based on SCr of 0.84 mg/dL).   Assessment: 44 YOM with history of LLE DVT in April 2021 - started on Xarelto however required thrombectomy in May 2021 and transitioned to apixaban at that time. 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 Last dose of apixaban was 7/27 at 8am  Now s/p alteplase infusion and mechanical thrombectomy.  Heparin level is therapeutic and low normal.  No bleeding reported.  Goal of Therapy:  Heparin level 0.2-0.5 units/mL Monitor platelets by anticoagulation protocol: Yes   Plan:  Increase heparin gtt slightly to 2550 units/hr F/U AM labs  Makiyla Linch D. Mina Marble, PharmD, BCPS, [place] 06/24/2020, 4:02 PM

## 2020-06-24 NOTE — Op Note (Signed)
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 Thrombolytics catheter check of lytics catheter in the left external iliac vein, common iliac vein, and distal IVC 2. Percutaneous mechanical thrombectomy of left external iliac vein, common iliac vein and distal IVC (Innari ClotTriever) 3. IVUS of left external iliac vein, common iliac vein and distal IVC 4. Venogram of left external iliac vein, common iliac vein and IVC 5. Angioplasty of left external iliac vein, common iliac vein and IVC (12 mm x 80 mm Mustang) 6. 55 minutes of monitored moderate conscious sedation time  Indications: 29 year old male who previously presented with extensive left leg DVT in the popliteal, superficial femoral vein and common femoral vein. Back in May he underwent left leg intervention with percutaneous mechanical thrombectomy and at the time had no iliac vein involvement and no signs of May Thurner and the thrombus appeared chronic. He recently presented earlier this week with worsening leg symptoms and new iliac vein involvement with clot into the distal IVC.  Findings:   IVUS was initially performed after the lytics catheter was removed and there was persistent thrombus in the left external iliac, common iliac and distal IVC that appeared both acute and chronic. Given poor outcome with lysis elected to perform percutaneous mechanical thrombectomy and five passes were made from the IVC through the left iliac vein retrieving large volumes of both acute and chronic appearing thrombus. Ultimately elected to angioplasty the left external and common iliac vein into the distal IVC with a 12 mm Mustang. I did not elect to place a stent given there is no inflow with common femoral vein occlusion as noted  on imaging yesterday and high risk for thrombosiss.  He now has patent left iliac vein with flow into the IVC and widely patent on venogram.    Procedure:  The patient was identified in the holding area and taken to room 8.  The patient was then placed supine on the table and prepped and draped in the usual sterile fashion.  A time out was called. Initially placed a Glidewire advantage and removed the UniFuse thrombolysis catheter from the left groin sheath. I then performed IVUS of the left external iliac, common iliac and IVC. There was a small flow channel around our lysis catheter but overall there was no significant improvement with large volumes of both acute and chronic thrombus throughout the left iliac vein into the IVC. Given poor response with lysis, I ultimately placed the Glidewire advantage into the right subclavian vein and elected to perform percutaneous mechanical thrombectomy with Innari ClotTriever and we made a total of five passes retrieving large volumes of both acute and chronic thrombus. IVUS again and it was apparent that he had fair amount of chronic thrombus in the wall the vein throughout the left iliac system that was not retrieved with percutaneous thrombectomy. I then got an updated venogram just to confirm that there was no extravasation from our intervention. I then elected to perform balloon angioplasty of the left iliac vein with a 12 mm Mustang throughout the entire segment to nominal pressure for 2 minutes. Final hand-injection showed patent left iliac vein system with flow into the IVC and no residual stenosis. I think he is going to be high risk for this recurring given no good inflow into the left iliac  system with an occluded common femoral vein. Given the left common femoral vein chronic occlusion, I did not want a place a stent because I think this will be high risk for thrombosis. That point time a 4-0 Monocryl pursestring was tied down around the sheath and the sheath  was removed and the left groin with good hemostasis. The sheath behind his left knee was also removed.   Plan: Patient will need to stay on heparin at therapeutic dose and this was continued during procedure. Will defer to heme-onc for discharge on anticoagulation.  Marty Heck, MD [facility] Office: [phone]

## 2020-06-24 NOTE — Progress Notes (Signed)
[facility]  Subjective  - no acute events overnight.   Objective 127/85 92 98.7 F (37.1 C) (Oral) 14 100%  Intake/Output Summary (Last 24 hours) at 06/24/2020 0836 Last data filed at 06/24/2020 0800 Gross per 24 hour  Intake 3552.74 ml  Output 750 ml  Net 2802.74 ml    Lytics catheter in left common femoral vein - no bleeding  Laboratory Lab Results: Recent Labs    06/23/20 2102 06/24/20 0058  WBC 10.4 10.6*  HGB 13.3 12.1*  HCT 42.2 38.8*  PLT 402* 426*   BMET Recent Labs    06/23/20 0036 06/24/20 0058  NA 137 131*  K 3.8 3.7  CL 102 98  CO2 26 22  GLUCOSE 124* 115*  BUN <5* <5*  CREATININE 0.87 0.84  CALCIUM 8.8* 8.7*    COAG Lab Results  Component Value Date   INR 1.2 06/22/2020   No results found for: PTT  Assessment/Planning:  Lytics catheter check in left iliac vein and IVC.  Thrombolysis overnight in ICU.    Marty Heck 06/24/2020 8:36 AM --

## 2020-06-24 NOTE — Progress Notes (Signed)
ANTICOAGULATION CONSULT NOTE  Pharmacy Consult for Heparin Indication: DVT  No Known Allergies  Patient Measurements: Height: 6\' 4"  (193 cm) Weight: (!) 131.5 kg (290 lb) IBW/kg (Calculated) : 86.8 Heparin Dosing Weight: 115.4 kg  Vital Signs: Temp: 100.4 F (38 C) (07/30 0005) Temp Source: Oral (07/30 0005) BP: 116/77 (07/30 0200) Pulse Rate: 98 (07/30 0200)  Labs: Recent Labs    06/21/20 2120 06/21/20 2120 06/21/20 2319 06/22/20 0332 06/22/20 0488 06/22/20 1504 06/23/20 0036 06/23/20 0036 06/23/20 0042 06/23/20 0816 06/23/20 2102 06/24/20 0058  HGB 13.5   < >  --   --   --   --  12.2*   < >  --   --  13.3 12.1*  HCT 41.9   < >  --   --   --   --  39.1  --   --   --  42.2 38.8*  PLT 410*   < >  --   --   --   --  397  --   --   --  402* 426*  APTT  --    < >  --   --    < > 90* 65*   < >  --  121* 36 50*  LABPROT  --   --   --   --   --  14.9  --   --   --   --   --   --   INR  --   --   --   --   --  1.2  --   --   --   --   --   --   HEPARINUNFRC  --   --    < >  --    < >  --   --   --  0.39  --  <0.10* 0.10*  CREATININE 0.93  --   --  0.81  --   --  0.87  --   --   --   --   --    < > = values in this interval not displayed.    Estimated Creatinine Clearance: 188.9 mL/min (by C-G formula based on SCr of 0.87 mg/dL).  . Chlorhexidine Gluconate Cloth  6 each Topical Daily  . sodium chloride flush  10-40 mL Intracatheter Q12H  . sodium chloride flush  3 mL Intravenous Q12H   Assessment: 29 y.o. male admitted with recurrent DVT, undergoing thrombolysis for iliac vein thrombosis,  for heparin   Goal of Therapy:  aPTT 66-102 seconds Heparin level 0.3-0.7 Monitor platelets by anticoagulation protocol: Yes   Plan:  Increase Heparin 2500 units/hr F/U after procedure in AM  Phillis Knack, PharmD, BCPS  06/24/2020 2:11 AM

## 2020-06-24 NOTE — Progress Notes (Signed)
06/24/2020 1415 Received pt to room 4E-05 from Cath Lab.  Pt is A&O, no c/o voiced at this time.  Tele monitor applied and CCMD notified.  CHG bath given.  Groins sites are WNL, both Level 0.  Oriented to room, call light and bed.  Call bell in reach, family at bedside. [place]

## 2020-06-24 NOTE — Progress Notes (Signed)
[facility] PROGRESS NOTE    Progress Note  Watson Robarge  CLE:[id number] DOB: [birth year] DOA: 06/21/2020 PCP: Patient, No Pcp Per     Brief Narrative:   Clovis Mankins is an 29 y.o. male past medical history significant for left lower extremity DVT now presents to the emergency room with lower lower abdominal pain and proximal left leg pain fever and chills.  He reports 3 conditions to [facility], he relates his symptoms t started about 5 days prior to admission.  Assessment/Plan:   Large left lower ext DVT (deep venous thrombosis) ([place]): Had a recent thrombectomy in May 2021, he was supposed to continue his to his anticoagulation.  CT of the abdomen and pelvis with contrast showed a large DVT. Surgery was consulted recommended thrombolytic overnight with percutaneous mechanical thrombectomy of left external iliac,common iliac and distal IVC on 7.30.2021. Cont heparin oral anticoagulation per hematology.  Thrombophlebitis    DVT prophylaxis: heparin Family Communication:none Status is: Inpatient  Remains inpatient appropriate because:Hemodynamically unstable   Dispo: The patient is from: Home              Anticipated d/c is to: Home              Anticipated d/c date is: > 3 days              Patient currently is not medically stable to d/c.        Code Status:     Code Status Orders  (From admission, onward)         Start     Ordered   06/22/20 0252  Full code  Continuous        06/22/20 0254        Code Status History    This patient has a current code status but no historical code status.   Advance Care Planning Activity        IV Access:    Peripheral IV   Procedures and diagnostic studies:   CARDIAC CATHETERIZATION  Result Date: 06/24/2020 Patient name: Ryan Ogborn         MRN: [id number]        DOB: 04/03/1991            Sex: male  06/24/2020 Pre-operative Diagnosis: Extensive left leg DVT with new iliac vein and distal IVC  involvement Post-operative diagnosis:  Same Surgeon:  Marty Heck, MD Procedure Performed: 1. Thrombolytics catheter check of lytics catheter in the left external iliac vein, common iliac vein, and distal IVC 2. Percutaneous mechanical thrombectomy of left external iliac vein, common iliac vein and distal IVC (Innari ClotTriever) 3. IVUS of left external iliac vein, common iliac vein and distal IVC 4. Venogram of left external iliac vein, common iliac vein and IVC 5. Angioplasty of left external iliac vein, common iliac vein and IVC (12 mm x 80 mm Mustang) 6. 55 minutes of monitored moderate conscious sedation time  Indications: 29 year old male who previously presented with extensive left leg DVT in the popliteal, superficial femoral vein and common femoral vein. Back in May he underwent left leg intervention with percutaneous mechanical thrombectomy and at the time had no iliac vein involvement and no signs of May Thurner and the thrombus appeared chronic. He recently presented earlier this week with worsening leg symptoms and new iliac vein involvement with clot into the distal IVC.  Findings:  IVUS was initially performed after the lytics catheter was removed and there was persistent thrombus in the left external  iliac, common iliac and distal IVC that appeared both acute and chronic. Given poor outcome with lysis elected to perform percutaneous mechanical thrombectomy and five passes were made from the IVC through the left iliac vein retrieving large volumes of both acute and chronic appearing thrombus. Ultimately elected to angioplasty the left external and common iliac vein into the distal IVC with a 12 mm Mustang. I did not elect to place a stent given there is no inflow with common femoral vein occlusion as noted on imaging yesterday and high risk for thrombosiss.  He now has patent left iliac vein with flow into the IVC and widely patent on venogram.             Procedure:  The patient was  identified in the holding area and taken to room 8.  The patient was then placed supine on the table and prepped and draped in the usual sterile fashion.  A time out was called. Initially placed a Glidewire advantage and removed the UniFuse thrombolysis catheter from the left groin sheath. I then performed IVUS of the left external iliac, common iliac and IVC. There was a small flow channel around our lysis catheter but overall there was no significant improvement with large volumes of both acute and chronic thrombus throughout the left iliac vein into the IVC. Given poor response with lysis, I ultimately placed the Glidewire advantage into the right subclavian vein and elected to perform percutaneous mechanical thrombectomy with Innari ClotTriever and we made a total of five passes retrieving large volumes of both acute and chronic thrombus. IVUS again and it was apparent that he had fair amount of chronic thrombus in the wall the vein throughout the left iliac system that was not retrieved with percutaneous thrombectomy. I then got an updated venogram just to confirm that there was no extravasation from our intervention. I then elected to perform balloon angioplasty of the left iliac vein with a 12 mm Mustang throughout the entire segment to nominal pressure for 2 minutes. Final hand-injection showed patent left iliac vein system with flow into the IVC and no residual stenosis. I think he is going to be high risk for this recurring given no good inflow into the left iliac system with an occluded common femoral vein. Given the left common femoral vein chronic occlusion, I did not want a place a stent because I think this will be high risk for thrombosis. That point time a 4-0 Monocryl pursestring was tied down around the sheath and the sheath was removed and the left groin with good hemostasis. The sheath behind his left knee was also removed.   Plan: Patient will need to stay on heparin at therapeutic dose and  this was continued during procedure. Will defer to heme-onc for discharge on anticoagulation.  Marty Heck, MD [facility] [place] Office: [phone]   PERIPHERAL VASCULAR CATHETERIZATION  Result Date: 06/24/2020 Patient name: Suyash Amory         MRN: [id number]        DOB: [birth year]            Sex: male  06/24/2020 Pre-operative Diagnosis: Extensive left leg DVT with new iliac vein and distal IVC involvement Post-operative diagnosis:  Same Surgeon:  Marty Heck, MD Procedure Performed: 1. Thrombolytics catheter check of lytics catheter in the left external iliac vein, common iliac vein, and distal IVC 2. Percutaneous mechanical thrombectomy of left external iliac vein, common iliac vein and distal IVC (Innari ClotTriever)  3. IVUS of left external iliac vein, common iliac vein and distal IVC 4. Venogram of left external iliac vein, common iliac vein and IVC 5. Angioplasty of left external iliac vein, common iliac vein and IVC (12 mm x 80 mm Mustang) 6. 55 minutes of monitored moderate conscious sedation time  Indications: 29 year old male who previously presented with extensive left leg DVT in the popliteal, superficial femoral vein and common femoral vein. Back in May he underwent left leg intervention with percutaneous mechanical thrombectomy and at the time had no iliac vein involvement and no signs of May Thurner and the thrombus appeared chronic. He recently presented earlier this week with worsening leg symptoms and new iliac vein involvement with clot into the distal IVC.  Findings:  IVUS was initially performed after the lytics catheter was removed and there was persistent thrombus in the left external iliac, common iliac and distal IVC that appeared both acute and chronic. Given poor outcome with lysis elected to perform percutaneous mechanical thrombectomy and five passes were made from the IVC through the left iliac vein retrieving large volumes of  both acute and chronic appearing thrombus. Ultimately elected to angioplasty the left external and common iliac vein into the distal IVC with a 12 mm Mustang. I did not elect to place a stent given there is no inflow with common femoral vein occlusion as noted on imaging yesterday and high risk for thrombosiss.  He now has patent left iliac vein with flow into the IVC and widely patent on venogram.             Procedure:  The patient was identified in the holding area and taken to room 8.  The patient was then placed supine on the table and prepped and draped in the usual sterile fashion.  A time out was called. Initially placed a Glidewire advantage and removed the UniFuse thrombolysis catheter from the left groin sheath. I then performed IVUS of the left external iliac, common iliac and IVC. There was a small flow channel around our lysis catheter but overall there was no significant improvement with large volumes of both acute and chronic thrombus throughout the left iliac vein into the IVC. Given poor response with lysis, I ultimately placed the Glidewire advantage into the right subclavian vein and elected to perform percutaneous mechanical thrombectomy with Innari ClotTriever and we made a total of five passes retrieving large volumes of both acute and chronic thrombus. IVUS again and it was apparent that he had fair amount of chronic thrombus in the wall the vein throughout the left iliac system that was not retrieved with percutaneous thrombectomy. I then got an updated venogram just to confirm that there was no extravasation from our intervention. I then elected to perform balloon angioplasty of the left iliac vein with a 12 mm Mustang throughout the entire segment to nominal pressure for 2 minutes. Final hand-injection showed patent left iliac vein system with flow into the IVC and no residual stenosis. I think he is going to be high risk for this recurring given no good inflow into the left iliac system  with an occluded common femoral vein. Given the left common femoral vein chronic occlusion, I did not want a place a stent because I think this will be high risk for thrombosis. That point time a 4-0 Monocryl pursestring was tied down around the sheath and the sheath was removed and the left groin with good hemostasis. The sheath behind his left knee was also removed.  Plan: Patient will need to stay on heparin at therapeutic dose and this was continued during procedure. Will defer to heme-onc for discharge on anticoagulation.  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  Ultrasound-guided access of left popliteal vein 2.  Ultrasound-guided access of left common femoral vein 3.  Left femoral and iliac venogram 4.  IVUS of left external iliac vein, left common iliac vein and IVC 5.  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  He recently presented here with now worsening symptoms and evidence of acute DVT in the left iliac and IVC that is new.  He presents for planned evaluation with venogram IVUS and possible thrombolysis.  Findings:  Initially accessed the left popliteal in the prone position and ultimately I could not get across the left common femoral vein occlusion from popliteal access and this appeared  chronic with large collaterals.  I left my sheath and flipped him over and accessed left common femoral vein and again could not get a wire to track.  I was able to stick higher on the left common femoral0 vein and finally got a wire into the external iliac common iliac vein and into the IVC.  On IVUS there appears to be acute on chronic thrombus in the left external and common iliac vein and distal IVC.  A thrombolytic's catheter was placed.             Procedure:  The patient was identified in the holding area and taken to room 8.  The patient was then placed prone on the table and prepped and draped in the usual sterile fashion.  A time out was called.  Ultrasound was used to evaluate the left popliteal vein.  Initially accessed the left popliteal vein with a micro needle placed a microwire.  I could not get this wire to track across the popliteal thrombus and suspected this was chronic and met significant resistance.  Finally I was able to get in the small saphenous and got a wire and micro sheath in place and finally placed an 8 French sheath and tried to get a Glidewire advantage up the femoral vein but could not get across the common femoral vein occlusion.  Venogram showed large collateral and this appeared chronic.  I then flipped him over both groins were prepped and draped and accessed the left common femoral vein and got venous backbleeding but again could not get a wire to track and suspect this was chronically occluded and it was not until I accessed the vein much higher that I got a wire into the iliac vein without significant resistance.  Finally I was able to IVUS over this once I confirmed I was in the IVC and there appeared to be a significant amount of acute on chronic thrombus in the  left external and common iliac vein as well as the distal IVC.  Subsequently did perform venogram to assure there was no extravasation prior to initiation of lysis.  I then placed a thrombolytics catheter in the  left external and common iliac vein into the IVC for a total treatment length of 30 cm.  All sheaths were secured and be taken to ICU.  Plan: Lytics check tomorrow. Run TPA at 0.25 mg/hr and heparin at 800 units/hr through the sheath.     Marty Heck, MD [facility] [place] Office: [phone]   VAS [place] IVC/ILIAC (VENOUS ONLY)  Result Date: 06/23/2020 IVC/ILIAC STUDY Indications: History of left lower ext. DVT extending into the iliac veins and              possible IVC on CT scan dated 06/21/20 Limitations: Obesity and air/bowel gas.  Performing Technologist: Oda Cogan RDMS, RVT  Examination Guidelines: A complete evaluation includes B-mode imaging, spectral Doppler, color Doppler, and power Doppler as needed of all accessible portions of each vessel. Bilateral testing is considered an integral part of a complete examination. Limited examinations for reoccurring indications may be performed as noted.  IVC/Iliac Findings: +----------+------+--------+------------------------------------------------+    IVC    PatentThrombus                    Comments                     +----------+------+--------+------------------------------------------------+ IVC Prox  patent                                                         +----------+------+--------+------------------------------------------------+ IVC Mid   patent                                                         +----------+------+--------+------------------------------------------------+ IVC Distalpatent        appears patent, cannot exclude partial thrombus. +----------+------+--------+------------------------------------------------+  +-------------------+---------+-----------+---------+-----------+--------+         CIV        RT-PatentRT-ThrombusLT-PatentLT-ThrombusComments +-------------------+---------+-----------+---------+-----------+--------+ Common Iliac Prox   patent               patent                      +-------------------+---------+-----------+---------+-----------+--------+ Common Iliac Mid    patent              patent                      +-------------------+---------+-----------+---------+-----------+--------+ Common Iliac Distal patent              patent                      +-------------------+---------+-----------+---------+-----------+--------+  +-------------------------+---------+-----------+---------+-----------+--------+            EIV           RT-PatentRT-ThrombusLT-PatentLT-ThrombusComments +-------------------------+---------+-----------+---------+-----------+--------+ External Iliac Vein Prox             acute               acute            +-------------------------+---------+-----------+---------+-----------+--------+  External Iliac Vein Mid              acute               acute            +-------------------------+---------+-----------+---------+-----------+--------+ External Iliac Vein                  acute               acute            Distal                                                                    +-------------------------+---------+-----------+---------+-----------+--------+   Summary: IVC/Iliac: There is no evidence of thrombus involving the IVC. There is evidence of acute thrombus involving the left common iliac vein. There is evidence of acute thrombus involving the left external iliac vein.  *See table(s) above for measurements and observations.  Electronically signed by Monica Martinez MD on 06/23/2020 at 3:47:19 PM.    Final    VAS [place] LOWER EXTREMITY VENOUS (DVT)  Result Date: 06/23/2020  Lower Venous DVTStudy Indications: Swelling, and History of left leg DVT found on CT scan dated 7/27.  Risk Factors: DVT left. Comparison Study: No prior. Performing Technologist: Oda Cogan RDMS, RVT  Examination Guidelines: A complete evaluation includes B-mode imaging, spectral  Doppler, color Doppler, and power Doppler as needed of all accessible portions of each vessel. Bilateral testing is considered an integral part of a complete examination. Limited examinations for reoccurring indications may be performed as noted. The reflux portion of the exam is performed with the patient in reverse Trendelenburg.  +-----+---------------+---------+-----------+----------+--------------+ RIGHTCompressibilityPhasicitySpontaneityPropertiesThrombus Aging +-----+---------------+---------+-----------+----------+--------------+ CFV  Full           No       No                                  +-----+---------------+---------+-----------+----------+--------------+   +---------+---------------+---------+-----------+----------+-----------------+ LEFT     CompressibilityPhasicitySpontaneityPropertiesThrombus Aging    +---------+---------------+---------+-----------+----------+-----------------+ CFV      None           No       No                   Acute             +---------+---------------+---------+-----------+----------+-----------------+ SFJ      Full                                                           +---------+---------------+---------+-----------+----------+-----------------+ FV Prox  Partial                                      Chronic           +---------+---------------+---------+-----------+----------+-----------------+ FV Mid   Full                                                           +---------+---------------+---------+-----------+----------+-----------------+  FV DistalFull                                                           +---------+---------------+---------+-----------+----------+-----------------+ PFV      Partial                                      Age Indeterminate +---------+---------------+---------+-----------+----------+-----------------+ POP      None           No       No                   Age  Indeterminate +---------+---------------+---------+-----------+----------+-----------------+ PTV      Full                                                           +---------+---------------+---------+-----------+----------+-----------------+ PERO     Full                                                           +---------+---------------+---------+-----------+----------+-----------------+     Summary: RIGHT: - No evidence of common femoral vein obstruction.  LEFT: - Findings consistent with acute deep vein thrombosis involving the left common femoral vein, and left popliteal vein. - Findings consistent with age indeterminate deep vein thrombosis involving the left femoral vein, and left proximal profunda vein.  *See table(s) above for measurements and observations. Electronically signed by Monica Martinez MD on 06/23/2020 at 3:47:59 PM.    Final      Medical Consultants:    None.  Anti-Infectives:   none  Subjective:    Omar Nasca pain controlled.  Objective:    Vitals:   06/24/20 1105 06/24/20 1120 06/24/20 1135 06/24/20 1205  BP: 126/74 (!) 133/76 (!) 131/78 127/76  Pulse: (!) 107 100 97 92  Resp: 15 19 (!) 25 22  Temp:      TempSrc:      SpO2: 97% 95% 97% 95%  Weight:      Height:       SpO2: 95 % O2 Flow Rate (L/min): 2 L/min   Intake/Output Summary (Last 24 hours) at 06/24/2020 1308 Last data filed at 06/24/2020 1231 Gross per 24 hour  Intake 3552.74 ml  Output 1400 ml  Net 2152.74 ml   Filed Weights   06/21/20 1930 06/23/20 0101 06/24/20 0405  Weight: (!) 131.5 kg (!) 131.5 kg (!) 131.4 kg    Exam: General exam: In no acute distress. Respiratory system: Good air movement and clear to auscultation. Cardiovascular system: S1 & S2 heard, RRR. No JVD. Gastrointestinal system: Abdomen is nondistended, soft and nontender.  Extremities: No pedal edema. Skin: No rashes, lesions or ulcers Psychiatry: Judgement and insight appear normal. Mood  & affect appropriate.   Data Reviewed:    Labs: Basic Metabolic Panel: Recent Labs  Lab 06/21/20 2120 06/21/20 2120 06/22/20 2671  06/22/20 0332 06/23/20 0036 06/24/20 0058  NA 135  --  136  --  137 131*  K 3.8   < > 3.7   < > 3.8 3.7  CL 98  --  102  --  102 98  CO2 24  --  23  --  26 22  GLUCOSE 94  --  84  --  124* 115*  BUN 5*  --  <5*  --  <5* <5*  CREATININE 0.93  --  0.81  --  0.87 0.84  CALCIUM 8.8*  --  8.7*  --  8.8* 8.7*   < > = values in this interval not displayed.   GFR Estimated Creatinine Clearance: 195.4 mL/min (by C-G formula based on SCr of 0.84 mg/dL). Liver Function Tests: Recent Labs  Lab 06/21/20 2120  AST 20  ALT 33  ALKPHOS 123  BILITOT 0.6  PROT 8.7*  ALBUMIN 3.8   Recent Labs  Lab 06/21/20 2120  LIPASE 24   No results for input(s): AMMONIA in the last 168 hours. Coagulation profile Recent Labs  Lab 06/22/20 1504  INR 1.2   COVID-19 Labs  No results for input(s): DDIMER, FERRITIN, LDH, CRP in the last 72 hours.  Lab Results  Component Value Date   [place] NEGATIVE 06/21/2020    CBC: Recent Labs  Lab 06/21/20 2120 06/23/20 0036 06/23/20 2102 06/24/20 0058  WBC 15.0* 10.2 10.4 10.6*  NEUTROABS 10.8*  --   --   --   HGB 13.5 12.2* 13.3 12.1*  HCT 41.9 39.1 42.2 38.8*  MCV 88.2 88.5 89.0 87.8  PLT 410* 397 402* 426*   Cardiac Enzymes: No results for input(s): CKTOTAL, CKMB, CKMBINDEX, TROPONINI in the last 168 hours. BNP (last 3 results) No results for input(s): PROBNP in the last 8760 hours. CBG: No results for input(s): GLUCAP in the last 168 hours. D-Dimer: No results for input(s): DDIMER in the last 72 hours. Hgb A1c: No results for input(s): HGBA1C in the last 72 hours. Lipid Profile: No results for input(s): CHOL, HDL, LDLCALC, TRIG, CHOLHDL, LDLDIRECT in the last 72 hours. Thyroid function studies: No results for input(s): TSH, T4TOTAL, T3FREE, THYROIDAB in the last 72 hours.  Invalid input(s):  FREET3 Anemia work up: No results for input(s): VITAMINB12, FOLATE, FERRITIN, TIBC, IRON, RETICCTPCT in the last 72 hours. Sepsis Labs: Recent Labs  Lab 06/21/20 2120 06/22/20 0332 06/23/20 0036 06/23/20 2102 06/24/20 0058  WBC 15.0*  --  10.2 10.4 10.6*  LATICACIDVEN  --  1.3  --   --   --    Microbiology Recent Results (from the past 240 hour(s))  SARS Coronavirus 2 by RT PCR (hospital order, performed in [facility] hospital lab) Nasopharyngeal Nasopharyngeal Swab     Status: None   Collection Time: 06/21/20 11:13 PM   Specimen: Nasopharyngeal Swab  Result Value Ref Range Status   SARS Coronavirus 2 NEGATIVE NEGATIVE Final    Comment: (NOTE) SARS-CoV-2 target nucleic acids are NOT DETECTED.  The SARS-CoV-2 RNA is generally detectable in upper and lower respiratory specimens during the acute phase of infection. The lowest concentration of SARS-CoV-2 viral copies this assay can detect is 250 copies / mL. A negative result does not preclude SARS-CoV-2 infection and should not be used as the sole basis for treatment or other patient management decisions.  A negative result may occur with improper specimen collection / handling, submission of specimen other than nasopharyngeal swab, presence of viral mutation(s) within the areas targeted  by this assay, and inadequate number of viral copies (<250 copies / mL). A negative result must be combined with clinical observations, patient history, and epidemiological information.  Fact Sheet for Patients:   StrictlyIdeas.no  Fact Sheet for Healthcare Providers: BankingDealers.co.za  This test is not yet approved or  cleared by the [place] FDA and has been authorized for detection and/or diagnosis of SARS-CoV-2 by FDA under an Emergency Use Authorization (EUA).  This EUA will remain in effect (meaning this test can be used) for the duration of the COVID-19 declaration under Section  564(b)(1) of the Act, 21 U.S.C. section 360bbb-3(b)(1), unless the authorization is terminated or revoked sooner.  Performed at [facility], [address] [place], [place] [address]   Culture, blood (routine x 2)     Status: None (Preliminary result)   Collection Time: 06/22/20  3:32 AM   Specimen: BLOOD RIGHT HAND  Result Value Ref Range Status   Specimen Description BLOOD RIGHT HAND  Final   Special Requests   Final    BOTTLES DRAWN AEROBIC AND ANAEROBIC Blood Culture results may not be optimal due to an inadequate volume of blood received in culture bottles   Culture   Final    NO GROWTH 1 DAY Performed at [facility], [place] [address], [place], [place] [address]    Report Status PENDING  Incomplete  Culture, blood (routine x 2)     Status: None (Preliminary result)   Collection Time: 06/22/20  3:32 AM   Specimen: BLOOD  Result Value Ref Range Status   Specimen Description BLOOD LEFT ANTECUBITAL  Final   Special Requests   Final    BOTTLES DRAWN AEROBIC AND ANAEROBIC Blood Culture results may not be optimal due to an inadequate volume of blood received in culture bottles   Culture   Final    NO GROWTH 1 DAY Performed at [facility], [place] [address], [place], [place] [address]    Report Status PENDING  Incomplete  Surgical pcr screen     Status: None   Collection Time: 06/24/20  1:00 AM   Specimen: Nasal Mucosa; Nasal Swab  Result Value Ref Range Status   MRSA, PCR NEGATIVE NEGATIVE Final   Staphylococcus aureus NEGATIVE NEGATIVE Final    Comment: (NOTE) The Xpert SA Assay (FDA approved for NASAL specimens in patients 36 years of age and older), is one component of a comprehensive surveillance program. It is not intended to diagnose infection nor to guide or monitor treatment. Performed at [facility], [place] [address] [place], [place] [address]      Medications:   . [MAR Hold] Chlorhexidine Gluconate Cloth  6 each Topical Daily  .  [MAR Hold] sodium chloride flush  10-40 mL Intracatheter Q12H  . [MAR Hold] sodium chloride flush  3 mL Intravenous Q12H   Continuous Infusions: . sodium chloride 100 mL/hr at 06/24/20 0800  . [MAR Hold] sodium chloride    . alteplase (LIMB ISCHEMIA) 10 mg in normal saline (0.02 mg/mL) infusion Stopped (06/24/20 0829)  . heparin 2,500 Units/hr (06/24/20 1147)      LOS: 2 days   [facility]  06/24/2020, 1:08 PM

## 2020-06-25 LAB — BASIC METABOLIC PANEL
Anion gap: 8 (ref 5–15)
BUN: <5 mg/dL — ABNORMAL LOW (ref 6–20)
CO2: 24 mmol/L (ref 22–32)
Calcium: 8.6 mg/dL — ABNORMAL LOW (ref 8.9–10.3)
Chloride: 105 mmol/L (ref 98–111)
Creatinine, Ser: 0.66 mg/dL (ref 0.61–1.24)
GFR calc Af Amer: >60 mL/min (ref >60)
GFR calc non Af Amer: >60 mL/min (ref >60)
Glucose, Bld: 99 mg/dL (ref 70–99)
Potassium: 3.9 mmol/L (ref 3.5–5.1)
Sodium: 137 mmol/L (ref 135–145)

## 2020-06-25 LAB — CBC
HCT: 35.2 % — ABNORMAL LOW (ref 39.0–52.0)
Hemoglobin: 11.1 g/dL — ABNORMAL LOW (ref 13.0–17.0)
MCH: 27.8 pg (ref 26.0–34.0)
MCHC: 31.5 g/dL (ref 30.0–36.0)
MCV: 88.2 fL (ref 80.0–100.0)
Platelets: 353 10*3/uL (ref 150–400)
RBC: 3.99 MIL/uL — ABNORMAL LOW (ref 4.22–5.81)
RDW: 13.7 % (ref 11.5–15.5)
WBC: 7.8 10*3/uL (ref 4.0–10.5)
nRBC: 0 % (ref 0.0–0.2)

## 2020-06-25 LAB — APTT: aPTT: 132 seconds — ABNORMAL HIGH (ref 24–36)

## 2020-06-25 LAB — HEPARIN LEVEL (UNFRACTIONATED): Heparin Unfractionated: 0.32 IU/mL (ref 0.30–0.70)

## 2020-06-25 MED ORDER — WARFARIN SODIUM 10 MG PO TABS
10.0000 mg | ORAL_TABLET | Freq: Once | ORAL | Status: AC
  Administered 2020-06-25: 10 mg via ORAL
  Filled 2020-06-25: qty 1

## 2020-06-25 MED ORDER — ENOXAPARIN (LOVENOX) PATIENT EDUCATION KIT
PACK | Freq: Once | Status: AC
  Filled 2020-06-25: qty 1

## 2020-06-25 MED ORDER — WARFARIN - PHARMACIST DOSING INPATIENT: Freq: Every day | Status: DC

## 2020-06-25 MED ORDER — ENOXAPARIN SODIUM 150 MG/ML ~~LOC~~ SOLN
130.0000 mg | Freq: Two times a day (BID) | SUBCUTANEOUS | Status: DC
  Administered 2020-06-25 – 2020-06-26 (×3): 130 mg via SUBCUTANEOUS
  Filled 2020-06-25 (×3): qty 0.86

## 2020-06-25 NOTE — Progress Notes (Addendum)
Progress Note    06/25/2020 7:47 AM 1 Day Post-Op  Subjective: no major complaints. Mild left groin soreness   Vitals:   06/24/20 2324 06/25/20 0441  BP: (!) 117/88 (!) 129/88  Pulse: 80 78  Resp: 15 (!) 11  Temp: 98.1 F (36.7 C) 98.1 F (36.7 C)  SpO2: 99% 97%   Physical Exam: Cardiac: regular Lungs:  Non labored Incisions:  Bilateral femoral access sites clean,dry and intact without hematoma. Left popliteal access site c/d/i. No swelling or hematoma. Extremities: bilateral lower extremities well perfused and warm. Palpable DP pulses bilaterally Abdomen:  Soft, non tender, non distended Neurologic: alert and oriented  CBC    Component Value Date/Time   WBC 7.8 06/25/2020 0204   RBC 3.99 (L) 06/25/2020 0204   HGB 11.1 (L) 06/25/2020 0204   HCT 35.2 (L) 06/25/2020 0204   PLT 353 06/25/2020 0204   MCV 88.2 06/25/2020 0204   MCH 27.8 06/25/2020 0204   MCHC 31.5 06/25/2020 0204   RDW 13.7 06/25/2020 0204   LYMPHSABS 2.5 06/21/2020 2120   MONOABS 1.5 (H) 06/21/2020 2120   EOSABS 0.1 06/21/2020 2120   BASOSABS 0.1 06/21/2020 2120    BMET    Component Value Date/Time   NA 137 06/25/2020 0204   K 3.9 06/25/2020 0204   CL 105 06/25/2020 0204   CO2 24 06/25/2020 0204   GLUCOSE 99 06/25/2020 0204   BUN <5 (L) 06/25/2020 0204   CREATININE 0.66 06/25/2020 0204   CALCIUM 8.6 (L) 06/25/2020 0204   GFRNONAA >60 06/25/2020 0204   GFRAA >60 06/25/2020 0204    INR    Component Value Date/Time   INR 1.2 06/22/2020 1504     Intake/Output Summary (Last 24 hours) at 06/25/2020 0747 Last data filed at 06/25/2020 0442 Gross per 24 hour  Intake 1919.03 ml  Output 2625 ml  Net -705.97 ml     Assessment/Plan:  29 y.o. male is s/p  1. Thrombolytics catheter check of lytics catheter in the left external iliac vein, common iliac vein, and distal IVC 2. Percutaneous mechanical thrombectomy of left external iliac vein, common iliac vein and distal IVC (Innari  ClotTriever) 3. IVUS of left external iliac vein, common iliac vein and distal IVC 4. Venogram of left external iliac vein, common iliac vein and IVC 5. Angioplasty of left external iliac vein, common iliac vein and IVC (12 mm x 80 mm Mustang) 1 Day Post-Op.  1.  Ultrasound-guided access of left popliteal vein 2.  Ultrasound-guided access of left common femoral vein 3.  Left femoral and iliac venogram 4.  IVUS of left external iliac vein, left common iliac vein and IVC 5.  Initiation of thrombolysis of the left external iliac vein, common iliac vein and IVC 3 Day Post- op.  Doing well post intervention. Having some left groin soreness from access site. Access sites in common femoral veins and left popliteal vein access site clean, dry and intact without swelling or hematoma. Continue on full dose Heparin. Appreciate Hem/ Onc consultation. Recommending Coumadin with Lovenox bridge at discharge. I believe they are working on arranging PCP who can monitor levels as outpatient. Mobilize today.  DVT prophylaxis: Heparin gtt   Karoline Caldwell, PA-C [facility] [phone] 06/25/2020 7:47 AM   I have interviewed the patient and examined the patient. I agree with the findings by the PA. Agree with plans for Coumadin with Lovenox bridge.  Okay for discharge from our standpoint.  Gae Gallop, MD [phone]

## 2020-06-25 NOTE — Progress Notes (Signed)
[facility] PROGRESS NOTE    Progress Note  Noah Moore  ZOX:[id number] DOB: [birth year] DOA: 06/21/2020 PCP: Patient, No Pcp Per     Brief Narrative:   Noah Moore is an 29 y.o. male past medical history significant for left lower extremity DVT now presents to the emergency room with lower lower abdominal pain and proximal left leg pain fever and chills.  He reports 3 conditions to Eliquis, he relates his symptoms t started about 5 days prior to admission.  Assessment/Plan:   Large left lower ext DVT (deep venous thrombosis) ([place]): Had a recent thrombectomy in May 2021, he was supposed to continue his to his anticoagulation.  CT of the abdomen and pelvis with contrast showed a large DVT. Surgery was consulted recommended thrombolytic overnight with percutaneous mechanical thrombectomy of left external iliac,common iliac and distal IVC on 7.30.2021. Currently on IV heparin will transition him to Lovenox continue Coumadin per pharmacy hopefully home on 06/26/2020. We will follow up with hematology as an outpatient.  Thrombophlebitis    DVT prophylaxis: heparin Family Communication:none Status is: Inpatient  Remains inpatient appropriate because:Hemodynamically unstable   Dispo: The patient is from: Home              Anticipated d/c is to: Home              Anticipated d/c date is: > 3 days              Patient currently is not medically stable to d/c.        Code Status:     Code Status Orders  (From admission, onward)         Start     Ordered   06/22/20 0252  Full code  Continuous        06/22/20 0254        Code Status History    This patient has a current code status but no historical code status.   Advance Care Planning Activity        IV Access:    Peripheral IV   Procedures and diagnostic studies:   CARDIAC CATHETERIZATION  Result Date: 06/24/2020 Patient name: Noah Moore         MRN: [id number]        DOB: 09-05-1991             Sex: male  06/24/2020 Pre-operative Diagnosis: Extensive left leg DVT with new iliac vein and distal IVC involvement Post-operative diagnosis:  Same Surgeon:  Marty Heck, MD Procedure Performed: 1. Thrombolytics catheter check of lytics catheter in the left external iliac vein, common iliac vein, and distal IVC 2. Percutaneous mechanical thrombectomy of left external iliac vein, common iliac vein and distal IVC (Innari ClotTriever) 3. IVUS of left external iliac vein, common iliac vein and distal IVC 4. Venogram of left external iliac vein, common iliac vein and IVC 5. Angioplasty of left external iliac vein, common iliac vein and IVC (12 mm x 80 mm Mustang) 6. 55 minutes of monitored moderate conscious sedation time  Indications: 29 year old male who previously presented with extensive left leg DVT in the popliteal, superficial femoral vein and common femoral vein. Back in May he underwent left leg intervention with percutaneous mechanical thrombectomy and at the time had no iliac vein involvement and no signs of May Thurner and the thrombus appeared chronic. He recently presented earlier this week with worsening leg symptoms and new iliac vein involvement with clot into the distal IVC.  Findings:  IVUS was initially performed after the lytics catheter was removed and there was persistent thrombus in the left external iliac, common iliac and distal IVC that appeared both acute and chronic. Given poor outcome with lysis elected to perform percutaneous mechanical thrombectomy and five passes were made from the IVC through the left iliac vein retrieving large volumes of both acute and chronic appearing thrombus. Ultimately elected to angioplasty the left external and common iliac vein into the distal IVC with a 12 mm Mustang. I did not elect to place a stent given there is no inflow with common femoral vein occlusion as noted on imaging yesterday and high risk for thrombosiss.  He now has patent  left iliac vein with flow into the IVC and widely patent on venogram.             Procedure:  The patient was identified in the holding area and taken to room 8.  The patient was then placed supine on the table and prepped and draped in the usual sterile fashion.  A time out was called. Initially placed a Glidewire advantage and removed the UniFuse thrombolysis catheter from the left groin sheath. I then performed IVUS of the left external iliac, common iliac and IVC. There was a small flow channel around our lysis catheter but overall there was no significant improvement with large volumes of both acute and chronic thrombus throughout the left iliac vein into the IVC. Given poor response with lysis, I ultimately placed the Glidewire advantage into the right subclavian vein and elected to perform percutaneous mechanical thrombectomy with Innari ClotTriever and we made a total of five passes retrieving large volumes of both acute and chronic thrombus. IVUS again and it was apparent that he had fair amount of chronic thrombus in the wall the vein throughout the left iliac system that was not retrieved with percutaneous thrombectomy. I then got an updated venogram just to confirm that there was no extravasation from our intervention. I then elected to perform balloon angioplasty of the left iliac vein with a 12 mm Mustang throughout the entire segment to nominal pressure for 2 minutes. Final hand-injection showed patent left iliac vein system with flow into the IVC and no residual stenosis. I think he is going to be high risk for this recurring given no good inflow into the left iliac system with an occluded common femoral vein. Given the left common femoral vein chronic occlusion, I did not want a place a stent because I think this will be high risk for thrombosis. That point time a 4-0 Monocryl pursestring was tied down around the sheath and the sheath was removed and the left groin with good hemostasis. The sheath  behind his left knee was also removed.   Plan: Patient will need to stay on heparin at therapeutic dose and this was continued during procedure. Will defer to heme-onc for discharge on anticoagulation.  Marty Heck, MD [facility] [place] Office: [phone]   PERIPHERAL VASCULAR CATHETERIZATION  Result Date: 06/24/2020 Patient name: Noah Moore         MRN: [id number]        DOB: 1991/04/04            Sex: male  06/24/2020 Pre-operative Diagnosis: Extensive left leg DVT with new iliac vein and distal IVC involvement Post-operative diagnosis:  Same Surgeon:  Marty Heck, MD Procedure Performed: 1. Thrombolytics catheter check of lytics catheter in the left external iliac vein, common iliac vein, and  distal IVC 2. Percutaneous mechanical thrombectomy of left external iliac vein, common iliac vein and distal IVC (Innari ClotTriever) 3. IVUS of left external iliac vein, common iliac vein and distal IVC 4. Venogram of left external iliac vein, common iliac vein and IVC 5. Angioplasty of left external iliac vein, common iliac vein and IVC (12 mm x 80 mm Mustang) 6. 55 minutes of monitored moderate conscious sedation time  Indications: 29 year old male who previously presented with extensive left leg DVT in the popliteal, superficial femoral vein and common femoral vein. Back in May he underwent left leg intervention with percutaneous mechanical thrombectomy and at the time had no iliac vein involvement and no signs of May Thurner and the thrombus appeared chronic. He recently presented earlier this week with worsening leg symptoms and new iliac vein involvement with clot into the distal IVC.  Findings:  IVUS was initially performed after the lytics catheter was removed and there was persistent thrombus in the left external iliac, common iliac and distal IVC that appeared both acute and chronic. Given poor outcome with lysis elected to perform percutaneous mechanical  thrombectomy and five passes were made from the IVC through the left iliac vein retrieving large volumes of both acute and chronic appearing thrombus. Ultimately elected to angioplasty the left external and common iliac vein into the distal IVC with a 12 mm Mustang. I did not elect to place a stent given there is no inflow with common femoral vein occlusion as noted on imaging yesterday and high risk for thrombosiss.  He now has patent left iliac vein with flow into the IVC and widely patent on venogram.             Procedure:  The patient was identified in the holding area and taken to room 8.  The patient was then placed supine on the table and prepped and draped in the usual sterile fashion.  A time out was called. Initially placed a Glidewire advantage and removed the UniFuse thrombolysis catheter from the left groin sheath. I then performed IVUS of the left external iliac, common iliac and IVC. There was a small flow channel around our lysis catheter but overall there was no significant improvement with large volumes of both acute and chronic thrombus throughout the left iliac vein into the IVC. Given poor response with lysis, I ultimately placed the Glidewire advantage into the right subclavian vein and elected to perform percutaneous mechanical thrombectomy with Innari ClotTriever and we made a total of five passes retrieving large volumes of both acute and chronic thrombus. IVUS again and it was apparent that he had fair amount of chronic thrombus in the wall the vein throughout the left iliac system that was not retrieved with percutaneous thrombectomy. I then got an updated venogram just to confirm that there was no extravasation from our intervention. I then elected to perform balloon angioplasty of the left iliac vein with a 12 mm Mustang throughout the entire segment to nominal pressure for 2 minutes. Final hand-injection showed patent left iliac vein system with flow into the IVC and no residual  stenosis. I think he is going to be high risk for this recurring given no good inflow into the left iliac system with an occluded common femoral vein. Given the left common femoral vein chronic occlusion, I did not want a place a stent because I think this will be high risk for thrombosis. That point time a 4-0 Monocryl pursestring was tied down around the sheath and the  sheath was removed and the left groin with good hemostasis. The sheath behind his left knee was also removed.   Plan: Patient will need to stay on heparin at therapeutic dose and this was continued during procedure. Will defer to heme-onc for discharge on anticoagulation.  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  Ultrasound-guided access of left popliteal vein 2.  Ultrasound-guided access of left common femoral vein 3.  Left femoral and iliac venogram 4.  IVUS of left external iliac vein, left common iliac vein and IVC 5.  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  He recently presented here with now worsening symptoms and evidence of acute DVT in the left iliac and IVC that is new.  He presents for planned evaluation with venogram IVUS and possible thrombolysis.  Findings:  Initially accessed the left popliteal in the prone position and  ultimately I could not get across the left common femoral vein occlusion from popliteal access and this appeared chronic with large collaterals.  I left my sheath and flipped him over and accessed left common femoral vein and again could not get a wire to track.  I was able to stick higher on the left common femoral0 vein and finally got a wire into the external iliac common iliac vein and into the IVC.  On IVUS there appears to be acute on chronic thrombus in the left external and common iliac vein and distal IVC.  A thrombolytic's catheter was placed.             Procedure:  The patient was identified in the holding area and taken to room 8.  The patient was then placed prone on the table and prepped and draped in the usual sterile fashion.  A time out was called.  Ultrasound was used to evaluate the left popliteal vein.  Initially accessed the left popliteal vein with a micro needle placed a microwire.  I could not get this wire to track across the popliteal thrombus and suspected this was chronic and met significant resistance.  Finally I was able to get in the small saphenous and got a wire and micro sheath in place and finally placed an 8 French sheath and tried to get a Glidewire advantage up the femoral vein but could not get across the common femoral vein occlusion.  Venogram showed large collateral and this appeared chronic.  I then flipped him over both groins were prepped and draped and accessed the left common femoral vein and got venous backbleeding but again could not get a wire to track and suspect this was chronically occluded and it was not until I accessed the vein much higher that I got a wire into the iliac vein without significant resistance.  Finally I was able to IVUS over this once I  confirmed I was in the IVC and there appeared to be a significant amount of acute on chronic thrombus in the left external and common iliac vein as well as the distal IVC.  Subsequently did perform venogram to  assure there was no extravasation prior to initiation of lysis.  I then placed a thrombolytics catheter in the left external and common iliac vein into the IVC for a total treatment length of 30 cm.  All sheaths were secured and be taken to ICU.  Plan: Lytics check tomorrow. Run TPA at 0.25 mg/hr and heparin at 800 units/hr through the sheath.     Marty Heck, MD [facility] [place] Office: [phone]     Medical Consultants:    None.  Anti-Infectives:   none  Subjective:    Noah Moore no complaints today.  Objective:    Vitals:   06/24/20 2324 06/25/20 0441 06/25/20 0831 06/25/20 0832  BP: (!) 117/88 (!) 129/88  124/85  Pulse: 80 78 91 90  Resp: 15 (!) _0 Temp: 98.1 F (36.7 C) 98.1 F (36.7 C)  98.4 F (36.9 C)  TempSrc: Oral Oral  Oral  SpO2: 99% 97%  99%  Weight:      Height:       SpO2: 99 % O2 Flow Rate (L/min): 2 L/min   Intake/Output Summary (Last 24 hours) at 06/25/2020 1037 Last data filed at 06/25/2020 0831 Gross per 24 hour  Intake 1644.18 ml  Output 3025 ml  Net -1380.82 ml   Filed Weights   06/21/20 1930 06/23/20 0101 06/24/20 0405  Weight: (!) 131.5 kg (!) 131.5 kg (!) 131.4 kg    Exam: General exam: In no acute distress. Respiratory system: Good air movement and clear to auscultation. Cardiovascular system: S1 & S2 heard, RRR. No JVD. Gastrointestinal system: Abdomen is nondistended, soft and nontender.  Extremities: No pedal edema. Skin: No rashes, lesions or ulcers Psychiatry: Judgement and insight appear normal. Mood & affect appropriate.   Data Reviewed:    Labs: Basic Metabolic Panel: Recent Labs  Lab 06/21/20 2120 06/21/20 2120 06/22/20 7425 06/22/20 9563 06/23/20 0036 06/23/20 0036 06/24/20 0058 06/25/20 0204  NA 135  --  136  --  137  --  131* 137  K 3.8   < > 3.7   < > 3.8   < > 3.7 3.9  CL 98  --  102  --  102  --  98 105  CO2 24  --  23  --  26  --  22 24    GLUCOSE 94  --  84  --  124*  --  115* 99  BUN 5*  --  <5*  --  <5*  --  <5* <5*  CREATININE 0.93  --  0.81  --  0.87  --  0.84 0.66  CALCIUM 8.8*  --  8.7*  --  8.8*  --  8.7* 8.6*   < > = values in this interval not displayed.   GFR Estimated Creatinine Clearance: 205.2 mL/min (by C-G formula based on SCr of 0.66 mg/dL). Liver Function Tests: Recent Labs  Lab 06/21/20 2120  AST 20  ALT 33  ALKPHOS 123  BILITOT 0.6  PROT 8.7*  ALBUMIN 3.8   Recent Labs  Lab 06/21/20 2120  LIPASE 24   No results for input(s): AMMONIA in the last 168 hours. Coagulation profile Recent Labs  Lab 06/22/20 1504  INR 1.2   COVID-19  Labs  No results for input(s): DDIMER, FERRITIN, LDH, CRP in the last 72 hours.  Lab Results  Component Value Date   [place] NEGATIVE 06/21/2020    CBC: Recent Labs  Lab 06/21/20 2120 06/23/20 0036 06/23/20 2102 06/24/20 0058 06/25/20 0204  WBC 15.0* 10.2 10.4 10.6* 7.8  NEUTROABS 10.8*  --   --   --   --   HGB 13.5 12.2* 13.3 12.1* 11.1*  HCT 41.9 39.1 42.2 38.8* 35.2*  MCV 88.2 88.5 89.0 87.8 88.2  PLT 410* 397 402* 426* 353   Cardiac Enzymes: No results for input(s): CKTOTAL, CKMB, CKMBINDEX, TROPONINI in the last 168 hours. BNP (last 3 results) No results for input(s): PROBNP in the last 8760 hours. CBG: No results for input(s): GLUCAP in the last 168 hours. D-Dimer: No results for input(s): DDIMER in the last 72 hours. Hgb A1c: No results for input(s): HGBA1C in the last 72 hours. Lipid Profile: No results for input(s): CHOL, HDL, LDLCALC, TRIG, CHOLHDL, LDLDIRECT in the last 72 hours. Thyroid function studies: No results for input(s): TSH, T4TOTAL, T3FREE, THYROIDAB in the last 72 hours.  Invalid input(s): FREET3 Anemia work up: No results for input(s): VITAMINB12, FOLATE, FERRITIN, TIBC, IRON, RETICCTPCT in the last 72 hours. Sepsis Labs: Recent Labs  Lab 06/21/20 2120 06/22/20 0332 06/23/20 0036 06/23/20 2102  06/24/20 0058 06/25/20 0204  WBC   < >  --  10.2 10.4 10.6* 7.8  LATICACIDVEN  --  1.3  --   --   --   --    < > = values in this interval not displayed.   Microbiology Recent Results (from the past 240 hour(s))  SARS Coronavirus 2 by RT PCR (hospital order, performed in [facility] hospital lab) Nasopharyngeal Nasopharyngeal Swab     Status: None   Collection Time: 06/21/20 11:13 PM   Specimen: Nasopharyngeal Swab  Result Value Ref Range Status   SARS Coronavirus 2 NEGATIVE NEGATIVE Final    Comment: (NOTE) SARS-CoV-2 target nucleic acids are NOT DETECTED.  The SARS-CoV-2 RNA is generally detectable in upper and lower respiratory specimens during the acute phase of infection. The lowest concentration of SARS-CoV-2 viral copies this assay can detect is 250 copies / mL. A negative result does not preclude SARS-CoV-2 infection and should not be used as the sole basis for treatment or other patient management decisions.  A negative result may occur with improper specimen collection / handling, submission of specimen other than nasopharyngeal swab, presence of viral mutation(s) within the areas targeted by this assay, and inadequate number of viral copies (<250 copies / mL). A negative result must be combined with clinical observations, patient history, and epidemiological information.  Fact Sheet for Patients:   StrictlyIdeas.no  Fact Sheet for Healthcare Providers: BankingDealers.co.za  This test is not yet approved or  cleared by the [place] FDA and has been authorized for detection and/or diagnosis of SARS-CoV-2 by FDA under an Emergency Use Authorization (EUA).  This EUA will remain in effect (meaning this test can be used) for the duration of the COVID-19 declaration under Section 564(b)(1) of the Act, 21 U.S.C. section 360bbb-3(b)(1), unless the authorization is terminated or revoked sooner.  Performed at [facility], [address] [place], [place] [address]   Culture, blood (routine x 2)     Status: None (Preliminary result)   Collection Time: 06/22/20  3:32 AM   Specimen: BLOOD RIGHT HAND  Result Value Ref Range Status  Specimen Description BLOOD RIGHT HAND  Final   Special Requests   Final    BOTTLES DRAWN AEROBIC AND ANAEROBIC Blood Culture results may not be optimal due to an inadequate volume of blood received in culture bottles   Culture   Final    NO GROWTH 3 DAYS Performed at [facility], [place] [address], [place], [place] [address]    Report Status PENDING  Incomplete  Culture, blood (routine x 2)     Status: None (Preliminary result)   Collection Time: 06/22/20  3:32 AM   Specimen: BLOOD  Result Value Ref Range Status   Specimen Description BLOOD LEFT ANTECUBITAL  Final   Special Requests   Final    BOTTLES DRAWN AEROBIC AND ANAEROBIC Blood Culture results may not be optimal due to an inadequate volume of blood received in culture bottles   Culture   Final    NO GROWTH 3 DAYS Performed at [facility], [place] [address] [place], [place] [address]    Report Status PENDING  Incomplete  Surgical pcr screen     Status: None   Collection Time: 06/24/20  1:00 AM   Specimen: Nasal Mucosa; Nasal Swab  Result Value Ref Range Status   MRSA, PCR NEGATIVE NEGATIVE Final   Staphylococcus aureus NEGATIVE NEGATIVE Final    Comment: (NOTE) The Xpert SA Assay (FDA approved for NASAL specimens in patients 49 years of age and older), is one component of a comprehensive surveillance program. It is not intended to diagnose infection nor to guide or monitor treatment. Performed at [facility], [place] [address], [place], [place] [address]      Medications:    Chlorhexidine Gluconate Cloth  6 each Topical Daily   sodium chloride flush  10-40 mL Intracatheter Q12H   sodium chloride flush  3 mL Intravenous Q12H   Continuous Infusions:  sodium chloride 100  mL/hr at 06/24/20 0800   heparin 2,550 Units/hr (06/25/20 0300)      LOS: 3 days   Charlynne Cousins  [facility]  06/25/2020, 10:37 AM

## 2020-06-25 NOTE — Progress Notes (Signed)
[facility] for Lovenox and Warfarin  Indication: DVT  No Known Allergies  Patient Measurements: Height: 6\' 4"  (193 cm) Weight: (!) 131.4 kg (289 lb 11 oz) IBW/kg (Calculated) : 86.8 Heparin Dosing Weight: 115.4 kg  Vital Signs: Temp: 98.4 F (36.9 C) (07/31 0832) Temp Source: Oral (07/31 0832) BP: 124/85 (07/31 0832) Pulse Rate: 90 (07/31 0832)  Labs: Recent Labs    06/22/20 1504 06/22/20 1504 06/23/20 0036 06/23/20 0042 06/23/20 2102 06/23/20 2102 06/24/20 0058 06/24/20 1521 06/25/20 0204  HGB  --   --  12.2*   < > 13.3   < > 12.1*  --  11.1*  HCT  --   --  39.1   < > 42.2  --  38.8*  --  35.2*  PLT  --   --  397   < > 402*  --  426*  --  353  APTT 90*   < > 65*   < > 36  --  50*  --  132*  LABPROT 14.9  --   --   --   --   --   --   --   --   INR 1.2  --   --   --   --   --   --   --   --   HEPARINUNFRC  --   --   --    < > <0.10*   < > 0.10* 0.31 0.32  CREATININE  --   --  0.87  --   --   --  0.84  --  0.66   < > = values in this interval not displayed.    Estimated Creatinine Clearance: 205.2 mL/min (by C-G formula based on SCr of 0.66 mg/dL).   Assessment: 77 YOM with history of LLE DVT in April 2021 - started on Xarelto however required thrombectomy in May 2021 and transitioned to apixaban at that time. Imaging shows recurrent LLE DVT this admission and pharmacy consulted to bridge with IM lovenox to warfarin which will be started today. No signs and symptoms of bleeding. Transitioning to lovenox in anticipation of discharge. Patients current DVT indicates he will require treatment dose 1mg /kg enoxaparin treatment. Patient's current weight, age, and renal function indicate he is a candidate for 10mg  initial warfarin dosing. Patient will require at least 5 days of lovenox and warfarin overlap therapy with aat least 2 therapeutic INRs before discontinuing lovenox.   Goal of Therapy:  INR 2-3 Monitor platelets by  anticoagulation protocol: Yes   Plan:  Discontinue heparin therapy  Initiate enoxaparin 130mg  SQ BID  Initiate warfarin 10mg  x1 with protime INR on 8/1 Monitor for signs and symptoms of bleeding CBC Q72H while inpatient, daily INR   Noah Moore, PharmD, [place] Resident [phone] 06/25/2020 10:53 AM

## 2020-06-25 NOTE — Progress Notes (Signed)
Mobility Specialist - Progress Note   06/25/20 1212  Mobility  Activity Ambulated in hall  Level of Assistance Independent  Assistive Device None  Distance Ambulated (ft) 500 ft  Mobility Response Tolerated well  Mobility performed by Mobility specialist  $Mobility charge 1 Mobility    Pre-mobility: 98 HR, 98% SpO2 Post-mobility: 115 HR, 98% SpO2  Pt endorsed mild lightheadedness while walking, he said he felt it was due to this being the first time he has ambulated in a while.   Pricilla Handler Mobility Specialist Mobility Specialist Phone: [phone]

## 2020-06-26 LAB — BASIC METABOLIC PANEL
Anion gap: 9 (ref 5–15)
BUN: <5 mg/dL — ABNORMAL LOW (ref 6–20)
CO2: 25 mmol/L (ref 22–32)
Calcium: 8.6 mg/dL — ABNORMAL LOW (ref 8.9–10.3)
Chloride: 104 mmol/L (ref 98–111)
Creatinine, Ser: 0.73 mg/dL (ref 0.61–1.24)
GFR calc Af Amer: >60 mL/min (ref >60)
GFR calc non Af Amer: >60 mL/min (ref >60)
Glucose, Bld: 87 mg/dL (ref 70–99)
Potassium: 3.6 mmol/L (ref 3.5–5.1)
Sodium: 138 mmol/L (ref 135–145)

## 2020-06-26 LAB — PROTIME-INR
INR: 1.2 (ref 0.8–1.2)
Prothrombin Time: 14.6 seconds (ref 11.4–15.2)

## 2020-06-26 LAB — APTT: aPTT: 37 seconds — ABNORMAL HIGH (ref 24–36)

## 2020-06-26 MED ORDER — WARFARIN SODIUM 10 MG PO TABS
10.0000 mg | ORAL_TABLET | Freq: Once | ORAL | Status: AC
  Administered 2020-06-26: 10 mg via ORAL
  Filled 2020-06-26: qty 1

## 2020-06-26 MED ORDER — WARFARIN SODIUM 6 MG PO TABS
6.0000 mg | ORAL_TABLET | Freq: Every day | ORAL | 0 refills | Status: DC
Start: 2020-06-26 — End: 2020-07-21

## 2020-06-26 MED ORDER — ENOXAPARIN SODIUM 150 MG/ML ~~LOC~~ SOLN: 130.0000 mg | Freq: Two times a day (BID) | SUBCUTANEOUS | 0 refills | Status: DC

## 2020-06-26 NOTE — Progress Notes (Addendum)
[facility] for Lovenox and Warfarin  Indication: DVT  No Known Allergies  Patient Measurements: Height: 6\' 4"  (193 cm) Weight: (!) 131.4 kg (289 lb 11 oz) IBW/kg (Calculated) : 86.8   Vital Signs: Temp: 98.4 F (36.9 C) (08/01 0434) Temp Source: Oral (08/01 0434) BP: 119/71 (08/01 0434) Pulse Rate: 79 (08/01 0434)  Labs: Recent Labs    06/23/20 2102 06/23/20 2102 06/24/20 0058 06/24/20 1521 06/25/20 0204 06/26/20 0244  HGB 13.3   < > 12.1*  --  11.1*  --   HCT 42.2  --  38.8*  --  35.2*  --   PLT 402*  --  426*  --  353  --   APTT 36   < > 50*  --  132* 37*  LABPROT  --   --   --   --   --  14.6  INR  --   --   --   --   --  1.2  HEPARINUNFRC <0.10*   < > 0.10* 0.31 0.32  --   CREATININE  --   --  0.84  --  0.66 0.73   < > = values in this interval not displayed.    Estimated Creatinine Clearance: 205.2 mL/min (by C-G formula based on SCr of 0.73 mg/dL).   Assessment: 33 YOM with history of LLE DVT in April 2021 - started on Xarelto however required thrombectomy in May 2021 and transitioned to apixaban at that time. 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 No signs and symptoms of bleeding. Patients currently on 1mg /kg for treatment of DVT and receiving 130mg  BID. Patient's most recent INR is 1.2 which is subtherapeutic. Patient's current weight, age, and renal function indicate he is a candidate for another dose of warfarin 10mg . Patient is day 2 of at least 5 days of lovenox and warfarin overlap therapy and will need at least 2 therapeutic INRs before discontinuing lovenox.   Goal of Therapy:  INR 2-3 Monitor platelets by anticoagulation protocol: Yes   Plan:  Continue enoxaparin 130mg  SQ BID  Warfarin 10mg  PO x1  Monitor for signs and symptoms of bleeding Educate patient prior to discharge CBC Q72H while inpatient, daily INR   [name], PharmD, [facility] Resident [phone] 06/26/2020 7:36 AM

## 2020-06-26 NOTE — Discharge Instructions (Signed)
Noah Moore was admitted to the Hospital on 06/21/2020 and Discharged on Discharge Date 06/26/2020 and should be excused from work/school   for 4   days starting 06/21/2020 , may return to work/school without any restrictions.  Call Bess Harvest MD, [facility] [phone] with questions.  Charlynne Cousins M.D on 06/26/2020,at 12:34 PM  [facility] Office  [phone]   Information on my medicine - Coumadin   (Warfarin)  This medication education was reviewed with me or my healthcare representative as part of my discharge preparation.  The pharmacist that spoke with me during my hospital stay was:  Llana Aliment, RPH  Why was Coumadin prescribed for you? Coumadin was prescribed for you because you have a blood clot or a medical condition that can cause an increased risk of forming blood clots. Blood clots can cause serious health problems by blocking the flow of blood to the heart, lung, or brain. Coumadin can prevent harmful blood clots from forming. As a reminder your indication for Coumadin is:   Deep Vein Thrombosis Treatment  What test will check on my response to Coumadin? While on Coumadin (warfarin) you will need to have an INR test regularly to ensure that your dose is keeping you in the desired range. The INR (international normalized ratio) number is calculated from the result of the laboratory test called prothrombin time (PT).  If an INR APPOINTMENT HAS NOT ALREADY BEEN MADE FOR YOU please schedule an appointment to have this lab work done by your health care provider within 7 days. Your INR goal is usually a number between:  2 to 3 or your provider may give you a more narrow range like 2-2.5.  Ask your health care provider during an office visit what your goal INR is.  What  do you need to  know  About  COUMADIN? Take Coumadin (warfarin) exactly as prescribed by your healthcare provider about the same time each day.  DO NOT stop taking without talking  to the doctor who prescribed the medication.  Stopping without other blood clot prevention medication to take the place of Coumadin may increase your risk of developing a new clot or stroke.  Get refills before you run out.  What do you do if you miss a dose? If you miss a dose, take it as soon as you remember on the same day then continue your regularly scheduled regimen the next day.  Do not take two doses of Coumadin at the same time.  Important Safety Information A possible side effect of Coumadin (Warfarin) is an increased risk of bleeding. You should call your healthcare provider right away if you experience any of the following: ? Bleeding from an injury or your nose that does not stop. ? Unusual colored urine (red or dark brown) or unusual colored stools (red or black). ? Unusual bruising for unknown reasons. ? A serious fall or if you hit your head (even if there is no bleeding).  Some foods or medicines interact with Coumadin (warfarin) and might alter your response to warfarin. To help avoid this: ? Eat a balanced diet, maintaining a consistent amount of Vitamin K. ? Notify your provider about major diet changes you plan to make. ? Avoid alcohol or limit your intake to 1 drink for women and 2 drinks for men per day. (1 drink is 5 oz. wine, 12 oz. beer, or 1.5 oz. liquor.)  Make sure that ANY health care provider who prescribes medication for you knows  that you are taking Coumadin (warfarin).  Also make sure the healthcare provider who is monitoring your Coumadin knows when you have started a new medication including herbals and non-prescription products.  Coumadin (Warfarin)  Major Drug Interactions  Increased Warfarin Effect Decreased Warfarin Effect  Alcohol (large quantities) Antibiotics (esp. Septra/Bactrim, Flagyl, Cipro) Amiodarone (Cordarone) Aspirin (ASA) Cimetidine (Tagamet) Megestrol (Megace) NSAIDs (ibuprofen, naproxen, etc.) Piroxicam (Feldene) Propafenone  (Rythmol SR) Propranolol (Inderal) Isoniazid (INH) Posaconazole (Noxafil) Barbiturates (Phenobarbital) Carbamazepine (Tegretol) Chlordiazepoxide (Librium) Cholestyramine (Questran) Griseofulvin Oral Contraceptives Rifampin Sucralfate (Carafate) Vitamin K   Coumadin (Warfarin) Major Herbal Interactions  Increased Warfarin Effect Decreased Warfarin Effect  Garlic Ginseng Ginkgo biloba Coenzyme Q10 Green tea St. John's wort    Coumadin (Warfarin) FOOD Interactions  Eat a consistent number of servings per week of foods HIGH in Vitamin K (1 serving =  cup)  Collards (cooked, or boiled & drained) Kale (cooked, or boiled & drained) Mustard greens (cooked, or boiled & drained) Parsley *serving size only =  cup Spinach (cooked, or boiled & drained) Swiss chard (cooked, or boiled & drained) Turnip greens (cooked, or boiled & drained)  Eat a consistent number of servings per week of foods MEDIUM-HIGH in Vitamin K (1 serving = 1 cup)  Asparagus (cooked, or boiled & drained) Broccoli (cooked, boiled & drained, or raw & chopped) Brussel sprouts (cooked, or boiled & drained) *serving size only =  cup Lettuce, raw (green leaf, endive, romaine) Spinach, raw Turnip greens, raw & chopped   These websites have more information on Coumadin (warfarin):  FailFactory.se; VeganReport.com.au;

## 2020-06-26 NOTE — Progress Notes (Signed)
Discharge instructions provided to patient. Medications and puncture site care reviewed. All questions answered. IV removed. Patient to be escorted home by significant other.

## 2020-06-26 NOTE — Discharge Summary (Signed)
Physician Discharge Summary  Noah Moore HKV:[id number] DOB: [birth year] DOA: 06/21/2020  PCP: Patient, No Pcp Per  Admit date: 06/21/2020 Discharge date: 06/26/2020  Admitted From: Home Disposition:  Home  Recommendations for Outpatient Follow-up:  1. Follow up with Hematology in 1-2 weeks 2. Please obtain BMP/CBC in one week   Home Health:No Equipment/Devices:None  Discharge Condition:Stable CODE STATUS:Full Diet recommendation: Heart Healthy  Brief/Interim Summary: 29 y.o. male past medical history significant for left lower extremity DVT now presents to the emergency room with lower lower abdominal pain and proximal left leg pain fever and chills.  He reports 3 conditions to [facility], he relates his symptoms t started about 5 days prior to admission.  Discharge Diagnoses:  Principal Problem:   DVT (deep venous thrombosis) (HCC) Active Problems:   Thrombophlebitis   Large left lower ext DVT (deep venous thrombosis) ([place]): Had a recent thrombectomy in May 2021, he was supposed to continue his to his anticoagulation.  CT of the abdomen and pelvis with contrast showed a large DVT. Started on heparin afn coumadin. Surgery was consulted recommended thrombolytic overnight with percutaneous mechanical thrombectomy of left external iliac,common iliac and distal IVC on 7.30.2021. Transition him to Lovenox continue Coumadin which he will continue as an outpatient.  Discharge Instructions  Discharge Instructions    Diet - low sodium heart healthy   Complete by: As directed    Increase activity slowly   Complete by: As directed      Allergies as of 06/26/2020   No Known Allergies     Medication List    STOP taking these medications   Eliquis 5 MG Tabs tablet Generic drug: apixaban     TAKE these medications   cetirizine 10 MG tablet Commonly known as: ZYRTEC Take 10 mg by mouth daily.   docusate sodium 100 MG capsule Commonly known as: COLACE Take 100 mg by mouth  daily as needed for mild constipation.   enoxaparin 150 MG/ML injection Commonly known as: LOVENOX Inject 0.86 mLs (130 mg total) into the skin 2 (two) times daily for 6 days.   warfarin 6 MG tablet Commonly known as: Coumadin Take 1 tablet (6 mg total) by mouth daily.       No Known Allergies  Consultations: Vascular  Procedures/Studies: CT ABDOMEN PELVIS W CONTRAST  Result Date: 06/21/2020 CLINICAL DATA:  29 year old male with lower abdominal pain. Concern for acute diverticulitis. EXAM: CT ABDOMEN AND PELVIS WITH CONTRAST TECHNIQUE: Multidetector CT imaging of the abdomen and pelvis was performed using the standard protocol following bolus administration of intravenous contrast. CONTRAST:  131m OMNIPAQUE IOHEXOL 300 MG/ML  SOLN COMPARISON:  None. FINDINGS: Lower chest: The visualized lung bases are clear. No intra-abdominal free air or free fluid. Hepatobiliary: Apparent mild fatty liver. No intrahepatic biliary ductal dilatation. The gallbladder is unremarkable. Pancreas: Unremarkable. No pancreatic ductal dilatation or surrounding inflammatory changes. Spleen: Normal in size without focal abnormality. Adrenals/Urinary Tract: The adrenal glands are unremarkable. There is no hydronephrosis on either side. Punctate nonobstructing left renal upper pole calculus. Subcentimeter left renal hypodense lesion is too small to characterize. The visualized ureters and urinary bladder appear unremarkable. Stomach/Bowel: There is no bowel obstruction or active inflammation. The appendix is normal. Vascular/Lymphatic: The abdominal aorta is unremarkable. There is large thrombus extending from the left profundus femoral vein to the junction of the common iliac veins and likely inferior aspect of the IVC. There is distension of the left common iliac vein, left internal and external iliac veins, and left  femoral vein with surrounding inflammatory changes in keeping with large thrombus. The thrombus appears  almost occlusive in the left common iliac vein. There is edema surrounding the left iliac venous system which may represent thrombophlebitis. Clinical correlation is recommended. The main portal vein appears patent. No portal venous gas. There is no adenopathy. Reproductive: The prostate and seminal vesicles are grossly unremarkable. Other: Edema of the left lateral pelvic sidewall and presacral space secondary to large DVT. Musculoskeletal: No acute or significant osseous findings. IMPRESSION: 1. Large DVT extending from the LEFT profundus femoral vein to the junction of the common iliac veins and likely into the inferior aspect of the IVC. There is stranding surrounding the left iliac venous system which may be reactive or represent thrombophlebitis. Clinical correlation is recommended. 2. No bowel obstruction. Normal appendix. 3. Punctate nonobstructing left renal upper pole calculus. No hydronephrosis. These results were called by telephone at the time of interpretation on 06/21/2020 at 10:23 pm to provider Veryl Speak , who verbally acknowledged these results. Electronically Signed   By: Anner Crete M.D.   On: 06/21/2020 22:23   CARDIAC CATHETERIZATION  Result Date: 06/24/2020 Patient name: Noah Moore         MRN: [id number]        DOB: May 08, 1991            Sex: male  06/24/2020 Pre-operative Diagnosis: Extensive left leg DVT with new iliac vein and distal IVC involvement Post-operative diagnosis:  Same Surgeon:  Marty Heck, MD Procedure Performed: 1. Thrombolytics catheter check of lytics catheter in the left external iliac vein, common iliac vein, and distal IVC 2. Percutaneous mechanical thrombectomy of left external iliac vein, common iliac vein and distal IVC (Innari ClotTriever) 3. IVUS of left external iliac vein, common iliac vein and distal IVC 4. Venogram of left external iliac vein, common iliac vein and IVC 5. Angioplasty of left external iliac vein, common iliac vein and IVC  (12 mm x 80 mm Mustang) 6. 55 minutes of monitored moderate conscious sedation time  Indications: 29 year old male who previously presented with extensive left leg DVT in the popliteal, superficial femoral vein and common femoral vein. Back in May he underwent left leg intervention with percutaneous mechanical thrombectomy and at the time had no iliac vein involvement and no signs of May Thurner and the thrombus appeared chronic. He recently presented earlier this week with worsening leg symptoms and new iliac vein involvement with clot into the distal IVC.  Findings:  IVUS was initially performed after the lytics catheter was removed and there was persistent thrombus in the left external iliac, common iliac and distal IVC that appeared both acute and chronic. Given poor outcome with lysis elected to perform percutaneous mechanical thrombectomy and five passes were made from the IVC through the left iliac vein retrieving large volumes of both acute and chronic appearing thrombus. Ultimately elected to angioplasty the left external and common iliac vein into the distal IVC with a 12 mm Mustang. I did not elect to place a stent given there is no inflow with common femoral vein occlusion as noted on imaging yesterday and high risk for thrombosiss.  He now has patent left iliac vein with flow into the IVC and widely patent on venogram.             Procedure:  The patient was identified in the holding area and taken to room 8.  The patient was then placed supine on the table and prepped and draped  in the usual sterile fashion.  A time out was called. Initially placed a Glidewire advantage and removed the UniFuse thrombolysis catheter from the left groin sheath. I then performed IVUS of the left external iliac, common iliac and IVC. There was a small flow channel around our lysis catheter but overall there was no significant improvement with large volumes of both acute and chronic thrombus throughout the left iliac  vein into the IVC. Given poor response with lysis, I ultimately placed the Glidewire advantage into the right subclavian vein and elected to perform percutaneous mechanical thrombectomy with Innari ClotTriever and we made a total of five passes retrieving large volumes of both acute and chronic thrombus. IVUS again and it was apparent that he had fair amount of chronic thrombus in the wall the vein throughout the left iliac system that was not retrieved with percutaneous thrombectomy. I then got an updated venogram just to confirm that there was no extravasation from our intervention. I then elected to perform balloon angioplasty of the left iliac vein with a 12 mm Mustang throughout the entire segment to nominal pressure for 2 minutes. Final hand-injection showed patent left iliac vein system with flow into the IVC and no residual stenosis. I think he is going to be high risk for this recurring given no good inflow into the left iliac system with an occluded common femoral vein. Given the left common femoral vein chronic occlusion, I did not want a place a stent because I think this will be high risk for thrombosis. That point time a 4-0 Monocryl pursestring was tied down around the sheath and the sheath was removed and the left groin with good hemostasis. The sheath behind his left knee was also removed.   Plan: Patient will need to stay on heparin at therapeutic dose and this was continued during procedure. Will defer to heme-onc for discharge on anticoagulation.  Marty Heck, MD [facility] Office: [phone]   PERIPHERAL VASCULAR CATHETERIZATION  Result Date: 06/24/2020 Patient name: Noah Moore         MRN: [id number]        DOB: 09-26-1991            Sex: male  06/24/2020 Pre-operative Diagnosis: Extensive left leg DVT with new iliac vein and distal IVC involvement Post-operative diagnosis:  Same Surgeon:  Marty Heck, MD Procedure Performed: 1.  Thrombolytics catheter check of lytics catheter in the left external iliac vein, common iliac vein, and distal IVC 2. Percutaneous mechanical thrombectomy of left external iliac vein, common iliac vein and distal IVC (Innari ClotTriever) 3. IVUS of left external iliac vein, common iliac vein and distal IVC 4. Venogram of left external iliac vein, common iliac vein and IVC 5. Angioplasty of left external iliac vein, common iliac vein and IVC (12 mm x 80 mm Mustang) 6. 55 minutes of monitored moderate conscious sedation time  Indications: 29 year old male who previously presented with extensive left leg DVT in the popliteal, superficial femoral vein and common femoral vein. Back in May he underwent left leg intervention with percutaneous mechanical thrombectomy and at the time had no iliac vein involvement and no signs of May Thurner and the thrombus appeared chronic. He recently presented earlier this week with worsening leg symptoms and new iliac vein involvement with clot into the distal IVC.  Findings:  IVUS was initially performed after the lytics catheter was removed and there was persistent thrombus in the left external iliac, common iliac and  distal IVC that appeared both acute and chronic. Given poor outcome with lysis elected to perform percutaneous mechanical thrombectomy and five passes were made from the IVC through the left iliac vein retrieving large volumes of both acute and chronic appearing thrombus. Ultimately elected to angioplasty the left external and common iliac vein into the distal IVC with a 12 mm Mustang. I did not elect to place a stent given there is no inflow with common femoral vein occlusion as noted on imaging yesterday and high risk for thrombosiss.  He now has patent left iliac vein with flow into the IVC and widely patent on venogram.             Procedure:  The patient was identified in the holding area and taken to room 8.  The patient was then placed supine on the table and  prepped and draped in the usual sterile fashion.  A time out was called. Initially placed a Glidewire advantage and removed the UniFuse thrombolysis catheter from the left groin sheath. I then performed IVUS of the left external iliac, common iliac and IVC. There was a small flow channel around our lysis catheter but overall there was no significant improvement with large volumes of both acute and chronic thrombus throughout the left iliac vein into the IVC. Given poor response with lysis, I ultimately placed the Glidewire advantage into the right subclavian vein and elected to perform percutaneous mechanical thrombectomy with Innari ClotTriever and we made a total of five passes retrieving large volumes of both acute and chronic thrombus. IVUS again and it was apparent that he had fair amount of chronic thrombus in the wall the vein throughout the left iliac system that was not retrieved with percutaneous thrombectomy. I then got an updated venogram just to confirm that there was no extravasation from our intervention. I then elected to perform balloon angioplasty of the left iliac vein with a 12 mm Mustang throughout the entire segment to nominal pressure for 2 minutes. Final hand-injection showed patent left iliac vein system with flow into the IVC and no residual stenosis. I think he is going to be high risk for this recurring given no good inflow into the left iliac system with an occluded common femoral vein. Given the left common femoral vein chronic occlusion, I did not want a place a stent because I think this will be high risk for thrombosis. That point time a 4-0 Monocryl pursestring was tied down around the sheath and the sheath was removed and the left groin with good hemostasis. The sheath behind his left knee was also removed.   Plan: Patient will need to stay on heparin at therapeutic dose and this was continued during procedure. Will defer to heme-onc for discharge on anticoagulation.   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  Ultrasound-guided access of left popliteal vein 2.  Ultrasound-guided access of left common femoral vein 3.  Left femoral and iliac venogram 4.  IVUS of left external iliac vein, left common iliac vein and IVC 5.  Initiation  of thrombolysis of the left external iliac vein, common iliac vein and IVC  Indications: Patient is a 29 year old male who previously presented with chronic DVT in his left popliteal superficial femoral and common femoral vein and underwent percutaneous mechanical thrombectomy with retrieval of small amounts of chronic thrombus.  He recently presented here with now worsening symptoms and evidence of acute DVT in the left iliac and IVC that is new.  He presents for planned evaluation with venogram IVUS and possible thrombolysis.  Findings:  Initially accessed the left popliteal in the prone position and ultimately I could not get across the left common femoral vein occlusion from popliteal access and this appeared chronic with large collaterals.  I left my sheath and flipped him over and accessed left common femoral vein and again could not get a wire to track.  I was able to stick higher on the left common femoral0 vein and finally got a wire into the external iliac common iliac vein and into the IVC.  On IVUS there appears to be acute on chronic thrombus in the left external and common iliac vein and distal IVC.  A thrombolytic's catheter was placed.             Procedure:  The patient was identified in the holding area and taken to room 8.  The  patient was then placed prone on the table and prepped and draped in the usual sterile fashion.  A time out was called.  Ultrasound was used to evaluate the left popliteal vein.  Initially accessed the left popliteal vein with a micro needle placed a microwire.  I could not get this wire to track across the popliteal thrombus and suspected this was chronic and met significant resistance.  Finally I was able to get in the small saphenous and got a wire and micro sheath in place and finally placed an 8 French sheath and tried to get a Glidewire advantage up the femoral vein but could not get across the common femoral vein occlusion.  Venogram showed large collateral and this appeared chronic.  I then flipped him over both groins were prepped and draped and accessed the left common femoral vein and got venous backbleeding but again could not get a wire to track and suspect this was chronically occluded and it was not until I accessed the vein much higher that I got a wire into the iliac vein without significant resistance.  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  Subsequently did perform venogram to assure there was no extravasation prior to initiation of lysis.  I then placed a thrombolytics catheter in the left external and common iliac vein into the IVC for a total treatment length of 30 cm.  All sheaths were secured and be taken to ICU.  Plan: Lytics check tomorrow. Run TPA at 0.25 mg/hr and heparin at 800 units/hr through the sheath.     Marty Heck, MD [facility] [place] Office: [phone]   VAS [place] IVC/ILIAC (VENOUS ONLY)  Result Date: 06/23/2020 IVC/ILIAC STUDY Indications: History of left lower ext. DVT extending into the iliac veins and              possible IVC on CT scan dated 06/21/20  Limitations: Obesity and air/bowel gas.  Performing Technologist: Oda Cogan RDMS, RVT  Examination Guidelines: A complete evaluation includes B-mode imaging, spectral Doppler, color Doppler, and power Doppler as needed of all accessible portions of each vessel. Bilateral testing is considered an integral part of a complete examination. Limited examinations for reoccurring indications may be performed as noted.  IVC/Iliac Findings: +----------+------+--------+------------------------------------------------+    IVC    PatentThrombus                    Comments                     +----------+------+--------+------------------------------------------------+ IVC Prox  patent                                                         +----------+------+--------+------------------------------------------------+ IVC Mid   patent                                                         +----------+------+--------+------------------------------------------------+ IVC Distalpatent        appears patent, cannot exclude partial thrombus. +----------+------+--------+------------------------------------------------+  +-------------------+---------+-----------+---------+-----------+--------+         CIV        RT-PatentRT-ThrombusLT-PatentLT-ThrombusComments +-------------------+---------+-----------+---------+-----------+--------+ Common Iliac Prox   patent              patent                      +-------------------+---------+-----------+---------+-----------+--------+ Common Iliac Mid    patent              patent                      +-------------------+---------+-----------+---------+-----------+--------+ Common Iliac Distal patent              patent                      +-------------------+---------+-----------+---------+-----------+--------+  +-------------------------+---------+-----------+---------+-----------+--------+            EIV            RT-PatentRT-ThrombusLT-PatentLT-ThrombusComments +-------------------------+---------+-----------+---------+-----------+--------+ External Iliac Vein Prox             acute               acute            +-------------------------+---------+-----------+---------+-----------+--------+ External Iliac Vein Mid              acute               acute            +-------------------------+---------+-----------+---------+-----------+--------+ External Iliac Vein                  acute               acute            Distal                                                                    +-------------------------+---------+-----------+---------+-----------+--------+  Summary: IVC/Iliac: There is no evidence of thrombus involving the IVC. There is evidence of acute thrombus involving the left common iliac vein. There is evidence of acute thrombus involving the left external iliac vein.  *See table(s) above for measurements and observations.  Electronically signed by Monica Martinez MD on 06/23/2020 at 3:47:19 PM.    Final    VAS [place] LOWER EXTREMITY VENOUS (DVT)  Result Date: 06/23/2020  Lower Venous DVTStudy Indications: Swelling, and History of left leg DVT found on CT scan dated 7/27.  Risk Factors: DVT left. Comparison Study: No prior. Performing Technologist: Oda Cogan RDMS, RVT  Examination Guidelines: A complete evaluation includes B-mode imaging, spectral Doppler, color Doppler, and power Doppler as needed of all accessible portions of each vessel. Bilateral testing is considered an integral part of a complete examination. Limited examinations for reoccurring indications may be performed as noted. The reflux portion of the exam is performed with the patient in reverse Trendelenburg.  +-----+---------------+---------+-----------+----------+--------------+ RIGHTCompressibilityPhasicitySpontaneityPropertiesThrombus Aging  +-----+---------------+---------+-----------+----------+--------------+ CFV  Full           No       No                                  +-----+---------------+---------+-----------+----------+--------------+   +---------+---------------+---------+-----------+----------+-----------------+ LEFT     CompressibilityPhasicitySpontaneityPropertiesThrombus Aging    +---------+---------------+---------+-----------+----------+-----------------+ CFV      None           No       No                   Acute             +---------+---------------+---------+-----------+----------+-----------------+ SFJ      Full                                                           +---------+---------------+---------+-----------+----------+-----------------+ FV Prox  Partial                                      Chronic           +---------+---------------+---------+-----------+----------+-----------------+ FV Mid   Full                                                           +---------+---------------+---------+-----------+----------+-----------------+ FV DistalFull                                                           +---------+---------------+---------+-----------+----------+-----------------+ PFV      Partial                                      Age Indeterminate +---------+---------------+---------+-----------+----------+-----------------+ POP      None  No       No                   Age Indeterminate +---------+---------------+---------+-----------+----------+-----------------+ PTV      Full                                                           +---------+---------------+---------+-----------+----------+-----------------+ PERO     Full                                                           +---------+---------------+---------+-----------+----------+-----------------+     Summary: RIGHT: - No evidence of common femoral vein obstruction.   LEFT: - Findings consistent with acute deep vein thrombosis involving the left common femoral vein, and left popliteal vein. - Findings consistent with age indeterminate deep vein thrombosis involving the left femoral vein, and left proximal profunda vein.  *See table(s) above for measurements and observations. Electronically signed by Monica Martinez MD on 06/23/2020 at 3:47:59 PM.    Final      Subjective: No complains  Discharge Exam: Vitals:   06/26/20 0434 06/26/20 0901  BP: 119/71 (!) 133/92  Pulse: 79 80  Resp: 14 20  Temp: 98.4 F (36.9 C) 98.5 F (36.9 C)  SpO2: 95% 98%   Vitals:   06/25/20 1945 06/25/20 2333 06/26/20 0434 06/26/20 0901  BP: (!) 132/85 (!) 134/88 119/71 (!) 133/92  Pulse: 80 84 79 80  Resp: _0 Temp: 98.3 F (36.8 C) 98.5 F (36.9 C) 98.4 F (36.9 C) 98.5 F (36.9 C)  TempSrc: Oral Oral Oral Oral  SpO2: 94% 98% 95% 98%  Weight:      Height:        General: Pt is alert, awake, not in acute distress Cardiovascular: RRR, S1/S2 +, no rubs, no gallops Respiratory: CTA bilaterally, no wheezing, no rhonchi Abdominal: Soft, NT, ND, bowel sounds + Extremities: no edema, no cyanosis    The results of significant diagnostics from this hospitalization (including imaging, microbiology, ancillary and laboratory) are listed below for reference.     Microbiology: Recent Results (from the past 240 hour(s))  SARS Coronavirus 2 by RT PCR (hospital order, performed in [facility] hospital lab) Nasopharyngeal Nasopharyngeal Swab     Status: None   Collection Time: 06/21/20 11:13 PM   Specimen: Nasopharyngeal Swab  Result Value Ref Range Status   SARS Coronavirus 2 NEGATIVE NEGATIVE Final    Comment: (NOTE) SARS-CoV-2 target nucleic acids are NOT DETECTED.  The SARS-CoV-2 RNA is generally detectable in upper and lower respiratory specimens during the acute phase of infection. The lowest concentration of SARS-CoV-2 viral copies this assay can  detect is 250 copies / mL. A negative result does not preclude SARS-CoV-2 infection and should not be used as the sole basis for treatment or other patient management decisions.  A negative result may occur with improper specimen collection / handling, submission of specimen other than nasopharyngeal swab, presence of viral mutation(s) within the areas targeted by this assay, and inadequate number of viral copies (<250 copies / mL). A negative result must be combined with  clinical observations, patient history, and epidemiological information.  Fact Sheet for Patients:   StrictlyIdeas.no  Fact Sheet for Healthcare Providers: BankingDealers.co.za  This test is not yet approved or  cleared by the [place] FDA and has been authorized for detection and/or diagnosis of SARS-CoV-2 by FDA under an Emergency Use Authorization (EUA).  This EUA will remain in effect (meaning this test can be used) for the duration of the COVID-19 declaration under Section 564(b)(1) of the Act, 21 U.S.C. section 360bbb-3(b)(1), unless the authorization is terminated or revoked sooner.  Performed at [facility] [address] [place], [place] [address]   Culture, blood (routine x 2)     Status: None (Preliminary result)   Collection Time: 06/22/20  3:32 AM   Specimen: BLOOD RIGHT HAND  Result Value Ref Range Status   Specimen Description BLOOD RIGHT HAND  Final   Special Requests   Final    BOTTLES DRAWN AEROBIC AND ANAEROBIC Blood Culture results may not be optimal due to an inadequate volume of blood received in culture bottles   Culture   Final    NO GROWTH 4 DAYS Performed at [facility], [place] [address], [place], [place] [address]    Report Status PENDING  Incomplete  Culture, blood (routine x 2)     Status: None (Preliminary result)   Collection Time: 06/22/20  3:32 AM   Specimen: BLOOD  Result Value Ref Range Status    Specimen Description BLOOD LEFT ANTECUBITAL  Final   Special Requests   Final    BOTTLES DRAWN AEROBIC AND ANAEROBIC Blood Culture results may not be optimal due to an inadequate volume of blood received in culture bottles   Culture   Final    NO GROWTH 4 DAYS Performed at [facility], [place] [address], [place], [place] [address]    Report Status PENDING  Incomplete  Surgical pcr screen     Status: None   Collection Time: 06/24/20  1:00 AM   Specimen: Nasal Mucosa; Nasal Swab  Result Value Ref Range Status   MRSA, PCR NEGATIVE NEGATIVE Final   Staphylococcus aureus NEGATIVE NEGATIVE Final    Comment: (NOTE) The Xpert SA Assay (FDA approved for NASAL specimens in patients 64 years of age and older), is one component of a comprehensive surveillance program. It is not intended to diagnose infection nor to guide or monitor treatment. Performed at [facility], [place] [address], [place], [place] [address]      Labs: BNP (last 3 results) No results for input(s): BNP in the last 8760 hours. Basic Metabolic Panel: Recent Labs  Lab 06/22/20 0332 06/23/20 0036 06/24/20 0058 06/25/20 0204 06/26/20 0244  NA 136 137 131* 137 138  K 3.7 3.8 3.7 3.9 3.6  CL 102 102 98 105 104  CO2 _0 GLUCOSE 84 124* 115* 99 87  BUN <5* <5* <5* <5* <5*  CREATININE 0.81 0.87 0.84 0.66 0.73  CALCIUM 8.7* 8.8* 8.7* 8.6* 8.6*   Liver Function Tests: Recent Labs  Lab 06/21/20 2120  AST 20  ALT 33  ALKPHOS 123  BILITOT 0.6  PROT 8.7*  ALBUMIN 3.8   Recent Labs  Lab 06/21/20 2120  LIPASE 24   No results for input(s): AMMONIA in the last 168 hours. CBC: Recent Labs  Lab 06/21/20 2120 06/23/20 0036 06/23/20 2102 06/24/20 0058 06/25/20 0204  WBC 15.0* 10.2 10.4 10.6* 7.8  NEUTROABS 10.8*  --   --   --   --  HGB 13.5 12.2* 13.3 12.1* 11.1*  HCT 41.9 39.1 42.2 38.8* 35.2*  MCV 88.2 88.5 89.0 87.8 88.2  PLT 410* 397 402* 426* 353   Cardiac Enzymes: No results for  input(s): CKTOTAL, CKMB, CKMBINDEX, TROPONINI in the last 168 hours. BNP: Invalid input(s): POCBNP CBG: No results for input(s): GLUCAP in the last 168 hours. D-Dimer No results for input(s): DDIMER in the last 72 hours. Hgb A1c No results for input(s): HGBA1C in the last 72 hours. Lipid Profile No results for input(s): CHOL, HDL, LDLCALC, TRIG, CHOLHDL, LDLDIRECT in the last 72 hours. Thyroid function studies No results for input(s): TSH, T4TOTAL, T3FREE, THYROIDAB in the last 72 hours.  Invalid input(s): FREET3 Anemia work up No results for input(s): VITAMINB12, FOLATE, FERRITIN, TIBC, IRON, RETICCTPCT in the last 72 hours. Urinalysis    Component Value Date/Time   COLORURINE YELLOW 06/21/2020 2120   APPEARANCEUR CLEAR 06/21/2020 2120   LABSPEC <1.005 (L) 06/21/2020 2120   PHURINE 6.0 06/21/2020 2120   GLUCOSEU NEGATIVE 06/21/2020 2120   HGBUR TRACE (A) 06/21/2020 2120   BILIRUBINUR NEGATIVE 06/21/2020 2120   KETONESUR NEGATIVE 06/21/2020 2120   PROTEINUR NEGATIVE 06/21/2020 2120   NITRITE NEGATIVE 06/21/2020 2120   LEUKOCYTESUR NEGATIVE 06/21/2020 2120   Sepsis Labs Invalid input(s): PROCALCITONIN,  WBC,  LACTICIDVEN Microbiology Recent Results (from the past 240 hour(s))  SARS Coronavirus 2 by RT PCR (hospital order, performed in [place] hospital lab) Nasopharyngeal Nasopharyngeal Swab     Status: None   Collection Time: 06/21/20 11:13 PM   Specimen: Nasopharyngeal Swab  Result Value Ref Range Status   SARS Coronavirus 2 NEGATIVE NEGATIVE Final    Comment: (NOTE) SARS-CoV-2 target nucleic acids are NOT DETECTED.  The SARS-CoV-2 RNA is generally detectable in upper and lower respiratory specimens during the acute phase of infection. The lowest concentration of SARS-CoV-2 viral copies this assay can detect is 250 copies / mL. A negative result does not preclude SARS-CoV-2 infection and should not be used as the sole basis for treatment or other patient  management decisions.  A negative result may occur with improper specimen collection / handling, submission of specimen other than nasopharyngeal swab, presence of viral mutation(s) within the areas targeted by this assay, and inadequate number of viral copies (<250 copies / mL). A negative result must be combined with clinical observations, patient history, and epidemiological information.  Fact Sheet for Patients:   StrictlyIdeas.no  Fact Sheet for Healthcare Providers: BankingDealers.co.za  This test is not yet approved or  cleared by the [place] FDA and has been authorized for detection and/or diagnosis of SARS-CoV-2 by FDA under an Emergency Use Authorization (EUA).  This EUA will remain in effect (meaning this test can be used) for the duration of the COVID-19 declaration under Section 564(b)(1) of the Act, 21 U.S.C. section 360bbb-3(b)(1), unless the authorization is terminated or revoked sooner.  Performed at [facility], [address] [place], [place] [address]   Culture, blood (routine x 2)     Status: None (Preliminary result)   Collection Time: 06/22/20  3:32 AM   Specimen: BLOOD RIGHT HAND  Result Value Ref Range Status   Specimen Description BLOOD RIGHT HAND  Final   Special Requests   Final    BOTTLES DRAWN AEROBIC AND ANAEROBIC Blood Culture results may not be optimal due to an inadequate volume of blood received in culture bottles   Culture   Final    NO GROWTH 4 DAYS Performed  at [facility], [place] [address], [place], [place] [address]    Report Status PENDING  Incomplete  Culture, blood (routine x 2)     Status: None (Preliminary result)   Collection Time: 06/22/20  3:32 AM   Specimen: BLOOD  Result Value Ref Range Status   Specimen Description BLOOD LEFT ANTECUBITAL  Final   Special Requests   Final    BOTTLES DRAWN AEROBIC AND ANAEROBIC Blood Culture results may not be optimal due to  an inadequate volume of blood received in culture bottles   Culture   Final    NO GROWTH 4 DAYS Performed at [facility], [place] [address] [place], [place] [address]    Report Status PENDING  Incomplete  Surgical pcr screen     Status: None   Collection Time: 06/24/20  1:00 AM   Specimen: Nasal Mucosa; Nasal Swab  Result Value Ref Range Status   MRSA, PCR NEGATIVE NEGATIVE Final   Staphylococcus aureus NEGATIVE NEGATIVE Final    Comment: (NOTE) The Xpert SA Assay (FDA approved for NASAL specimens in patients 52 years of age and older), is one component of a comprehensive surveillance program. It is not intended to diagnose infection nor to guide or monitor treatment. Performed at [facility], [place] [address] [place], [place] [address]      Time coordinating discharge: Over 30 minutes  SIGNED:   Charlynne Cousins, MD  [facility] 06/26/2020, 12:32 PM Pager   If 7PM-7AM, please contact night-coverage [email or web address] Password [email or web address]

## 2020-06-27 ENCOUNTER — Encounter: Payer: Self-pay | Admitting: Hematology and Oncology

## 2020-06-27 ENCOUNTER — Telehealth: Payer: Self-pay | Admitting: Hematology and Oncology

## 2020-06-27 LAB — CULTURE, BLOOD (ROUTINE X 2)
Culture: NO GROWTH
Culture: NO GROWTH

## 2020-06-27 NOTE — Telephone Encounter (Signed)
Scheduled appt per 8/2 schedule message - unable to reach pt .left message for patient with appt date and time

## 2020-06-29 ENCOUNTER — Ambulatory Visit (INDEPENDENT_AMBULATORY_CARE_PROVIDER_SITE_OTHER): Payer: 59 | Admitting: Adult Health

## 2020-06-29 ENCOUNTER — Encounter: Payer: Self-pay | Admitting: Adult Health

## 2020-06-29 ENCOUNTER — Other Ambulatory Visit: Payer: Self-pay

## 2020-06-29 ENCOUNTER — Ambulatory Visit (INDEPENDENT_AMBULATORY_CARE_PROVIDER_SITE_OTHER): Payer: 59 | Admitting: General Practice

## 2020-06-29 VITALS — BP 112/84 | Temp 97.9°F | Wt 286.0 lb

## 2020-06-29 DIAGNOSIS — I82422 Acute embolism and thrombosis of left iliac vein: Secondary | ICD-10-CM | POA: Diagnosis not present

## 2020-06-29 DIAGNOSIS — Z7901 Long term (current) use of anticoagulants: Secondary | ICD-10-CM

## 2020-06-29 DIAGNOSIS — Z7689 Persons encountering health services in other specified circumstances: Secondary | ICD-10-CM

## 2020-06-29 LAB — POCT INR: INR: 2.4 (ref 2.0–3.0)

## 2020-06-29 NOTE — Progress Notes (Signed)
I have reviewed the results and agree with this plan   

## 2020-06-29 NOTE — Patient Instructions (Signed)
Pre visit review using our clinic review tool, if applicable. No additional management support is needed unless otherwise documented below in the visit note.  Take 1 tablet daily except take 1/2 tablet Friday and Sunday.  Re-check on Monday.  Continue Lovenox through Saturday.

## 2020-06-29 NOTE — Progress Notes (Signed)
Patient presents to clinic today to establish care and for TCM visit. He is a pleasant 29 year old male who  has a past medical history of Allergy, DVT (deep venous thrombosis) ([place]), and DVT (deep venous thrombosis) ([place]) (02/2020).   He has not had a PCP in the past  He was recently admitted from 06/21/2020 to 06/26/2020  He presented to the emergency room on 06/21/2020 for evaluation of lower abdominal proximal left leg pain, fevers, and chills.  He developed his symptoms approximately 5 days prior.  He was seen on 06/20/2020 for the symptoms and was told that he did not have a UTI or kidney stone.  The pain persisted and came unbearable which prompted him to go to the emergency room.  In the past he has been diagnosed with left lower extremity DVT in late April 2021 and started on Xarelto, he returned with increasing pain and underwent thrombectomy in early May 2021 and was transitioned to Eliquis.  He was seen by hematology and it was felt that his DVT was provoked by prolonged immobilization in a vehicle and no further work-up was recommended.  He was planning to follow-up with hematology in 3 to 6 months.  Since being transitioned to Eliquis he ported strict adherence.  Upon arrival to the ED his CT of the abdomen and pelvis was concerning for large DVT extending from left profundus femoral vein to the junction of common iliac veins and likely inferior IVC.  CT was also notable for stranding about the left iliac vein which could reflect thrombophlebitis.  Surgery recommended thrombolytic overnight with percutaneous mechanical thrombectomy of left external iliac, common iliac, and distal IVC on 06/24/2020.  He was transitioned to Lovenox and continued on Coumadin outpatient.   Past Medical History:  Diagnosis Date  . Allergy   . DVT (deep venous thrombosis) ([place])   . DVT (deep venous thrombosis) ([place]) 02/2020    Past Surgical History:  Procedure Laterality Date  . INTRAVASCULAR  ULTRASOUND/IVUS N/A 03/30/2020   Procedure: INTRAVASCULAR ULTRASOUND/IVUS;  Surgeon: Marty Heck, MD;  Location: [facility];  Service: Cardiovascular;  Laterality: N/A;  . INTRAVASCULAR ULTRASOUND/IVUS N/A 06/24/2020   Procedure: Intravascular Ultrasound/IVUS;  Surgeon: Marty Heck, MD;  Location: [facility];  Service: Cardiovascular;  Laterality: N/A;  . IVC VENOGRAPHY N/A 03/30/2020   Procedure: IVC Venography;  Surgeon: Marty Heck, MD;  Location: [facility];  Service: Cardiovascular;  Laterality: N/A;  . KNEE SURGERY Right   . LEG SURGERY     Reports history of right lower extremity tibia fracture in 2010 which was repaired surgically.  . LOWER EXTREMITY VENOGRAPHY Left 03/30/2020   Procedure: LOWER EXTREMITY VENOGRAPHY;  Surgeon: Marty Heck, MD;  Location: [facility];  Service: Cardiovascular;  Laterality: Left;  . LOWER EXTREMITY VENOGRAPHY  06/23/2020   Procedure: LOWER EXTREMITY VENOGRAPHY;  Surgeon: Marty Heck, MD;  Location: [facility];  Service: Cardiovascular;;  . PERIPHERAL VASCULAR THROMBECTOMY Left 03/30/2020   Procedure: PERIPHERAL VASCULAR THROMBECTOMY;  Surgeon: Marty Heck, MD;  Location: [facility];  Service: Cardiovascular;  Laterality: Left;  . PERIPHERAL VASCULAR THROMBECTOMY N/A 06/24/2020   Procedure: PERIPHERAL VASCULAR THROMBECTOMY;  Surgeon: Marty Heck, MD;  Location: [facility];  Service: Cardiovascular;  Laterality: N/A;  . THROMBECTOMY  03/2020    Current Outpatient Medications on File Prior to Visit  Medication Sig Dispense Refill  . cetirizine (  ZYRTEC) 10 MG tablet Take 1 tablet by mouth daily as needed for allergies.     . cetirizine (ZYRTEC) 10 MG tablet Take 10 mg by mouth daily.    Marland Kitchen docusate sodium (COLACE) 100 MG capsule Take 100 mg by mouth daily as needed for mild constipation.    . enoxaparin (LOVENOX) 150 MG/ML injection Inject 0.86 mLs  (130 mg total) into the skin 2 (two) times daily for 6 days. 10.32 mL 0  . warfarin (COUMADIN) 6 MG tablet Take 1 tablet (6 mg total) by mouth daily. 30 tablet 0   No current facility-administered medications on file prior to visit.    No Known Allergies  Family History  Adopted: Yes  Problem Relation Age of Onset  . Venous thrombosis Neg Hx     Social History   Socioeconomic History  . Marital status: Single    Spouse name: Not on file  . Number of children: Not on file  . Years of education: Not on file  . Highest education level: Not on file  Occupational History  . Not on file  Tobacco Use  . Smoking status: Former Smoker    Types: E-cigarettes, Cigarettes  . Smokeless tobacco: Never Used  Vaping Use  . Vaping Use: Former  Substance and Sexual Activity  . Alcohol use: Not Currently  . Drug use: Not Currently  . Sexual activity: Not on file  Other Topics Concern  . Not on file  Social History Narrative   ** Merged History Encounter **       Social Determinants of Health   Financial Resource Strain:   . Difficulty of Paying Living Expenses:   Food Insecurity:   . Worried About Charity fundraiser in the Last Year:   . Arboriculturist in the Last Year:   Transportation Needs:   . Film/video editor (Medical):   Marland Kitchen Lack of Transportation (Non-Medical):   Physical Activity:   . Days of Exercise per Week:   . Minutes of Exercise per Session:   Stress:   . Feeling of Stress :   Social Connections:   . Frequency of Communication with Friends and Family:   . Frequency of Social Gatherings with Friends and Family:   . Attends Religious Services:   . Active Member of Clubs or Organizations:   . Attends Archivist Meetings:   Marland Kitchen Marital Status:   Intimate Partner Violence:   . Fear of Current or Ex-Partner:   . Emotionally Abused:   Marland Kitchen Physically Abused:   . Sexually Abused:     Review of Systems  Constitutional: Negative.   HENT: Negative.     Eyes: Negative.   Respiratory: Negative.   Cardiovascular: Negative.   Gastrointestinal: Negative.   Genitourinary: Negative.   Musculoskeletal: Negative.   Skin: Negative.   Neurological: Negative.   Endo/Heme/Allergies: Bruises/bleeds easily.  Psychiatric/Behavioral: Negative.   All other systems reviewed and are negative.   BP 112/84   Temp 97.9 F (36.6 C)   Wt 286 lb (129.7 kg)   BMI 34.81 kg/m   Physical Exam Vitals and nursing note reviewed.  Constitutional:      Appearance: Normal appearance. He is obese.  Cardiovascular:     Rate and Rhythm: Normal rate and regular rhythm.     Pulses: Normal pulses.     Heart sounds: Normal heart sounds.  Pulmonary:     Effort: Pulmonary effort is normal.     Breath sounds: Normal  breath sounds.  Abdominal:     General: Abdomen is flat.     Palpations: Abdomen is soft.  Musculoskeletal:        General: Normal range of motion.  Skin:    General: Skin is warm and dry.     Capillary Refill: Capillary refill takes less than 2 seconds.     Findings: Bruising (RLQ and left groin ) present.  Neurological:     General: No focal deficit present.     Mental Status: He is alert and oriented to person, place, and time.  Psychiatric:        Mood and Affect: Mood normal.        Behavior: Behavior normal.        Thought Content: Thought content normal.        Judgment: Judgment normal.     Assessment/Plan: 1. Encounter to establish care - Follow up for CPE  - Weight loss - heart healthy diet   2. Acute deep vein thrombosis (DVT) of iliac vein of left lower extremity [facility]) -Reviewed hospital admission note, imaging, labs, and discharge instructions.  All questions answered to the best of my ability -Anticoagulation failure with Xarelto and Eliquis.  Continue with Coumadin and Lovenox.  He will see Coumadin RN after this visit today for further adjustments.  Follow-up with hematology next week.  May need to consider referring to  [facility] hematology in the future.  Dorothyann Peng, NP

## 2020-06-29 NOTE — Patient Instructions (Signed)
It was great meeting you today   We will follow up with you once you see Hematology next week   Please let me know if you need anything

## 2020-07-04 ENCOUNTER — Ambulatory Visit (HOSPITAL_COMMUNITY)
Admission: RE | Admit: 2020-07-04 | Discharge: 2020-07-04 | Disposition: A | Discharge status: Home / Self Care | Payer: 59 | Source: Ambulatory Visit | Attending: Adult Health | Admitting: Adult Health

## 2020-07-04 ENCOUNTER — Ambulatory Visit: Payer: 59 | Admitting: Adult Health

## 2020-07-04 ENCOUNTER — Ambulatory Visit (HOSPITAL_BASED_OUTPATIENT_CLINIC_OR_DEPARTMENT_OTHER)
Admission: RE | Admit: 2020-07-04 | Discharge: 2020-07-04 | Disposition: A | Discharge status: Home / Self Care | Payer: 59 | Source: Ambulatory Visit | Attending: Adult Health | Admitting: Adult Health

## 2020-07-04 ENCOUNTER — Other Ambulatory Visit: Payer: Self-pay

## 2020-07-04 ENCOUNTER — Other Ambulatory Visit (HOSPITAL_COMMUNITY): Payer: Self-pay | Admitting: Adult Health

## 2020-07-04 ENCOUNTER — Other Ambulatory Visit: Payer: Self-pay | Admitting: Adult Health

## 2020-07-04 ENCOUNTER — Encounter: Payer: Self-pay | Admitting: Adult Health

## 2020-07-04 ENCOUNTER — Ambulatory Visit (INDEPENDENT_AMBULATORY_CARE_PROVIDER_SITE_OTHER): Payer: 59 | Admitting: General Practice

## 2020-07-04 ENCOUNTER — Telehealth: Payer: Self-pay | Admitting: Vascular Surgery

## 2020-07-04 ENCOUNTER — Other Ambulatory Visit: Payer: Self-pay | Admitting: General Practice

## 2020-07-04 DIAGNOSIS — Z7901 Long term (current) use of anticoagulants: Secondary | ICD-10-CM

## 2020-07-04 DIAGNOSIS — I82422 Acute embolism and thrombosis of left iliac vein: Secondary | ICD-10-CM

## 2020-07-04 DIAGNOSIS — Z0289 Encounter for other administrative examinations: Secondary | ICD-10-CM

## 2020-07-04 LAB — POCT INR: INR: 2.2 (ref 2.0–3.0)

## 2020-07-04 MED ORDER — HYDROCODONE-ACETAMINOPHEN 10-325 MG PO TABS: 1.0000 | ORAL_TABLET | Freq: Three times a day (TID) | ORAL | 0 refills | Status: AC | PRN

## 2020-07-04 MED ORDER — ENOXAPARIN SODIUM 150 MG/ML ~~LOC~~ SOLN: 130.0000 mg | Freq: Two times a day (BID) | SUBCUTANEOUS | 0 refills | Status: DC

## 2020-07-04 NOTE — Telephone Encounter (Signed)
Called by Beaulah Dinning NP at [facility].  Pt has occluded his left iliac stent and has swelling in his leg.  Case d/w Dr Carlis Abbott.  Pt has poor inflow to support stent and no plan for reintervention now that this has occluded. I will set up a follow up appt with Dr Carlis Abbott.  Ruta Hinds, MD [facility] Office: [phone]

## 2020-07-04 NOTE — Progress Notes (Signed)
VASCULAR LAB    Left lower extremity venous duplex and duplex of IVC/Iliacs completed.    Preliminary report:  See CV proc for preliminary results.  Called Dorothyann Peng, NP with results.  Dorwin Fitzhenry, RVT 07/04/2020, 4:08 PM

## 2020-07-04 NOTE — Patient Instructions (Addendum)
Pre visit review using our clinic review tool, if applicable. No additional management support is needed unless otherwise documented below in the visit note.  Take 1 1/2 tablets today and then change dosage and take 1 tablet daily except 1/2 tablet on Sunday.  Re-start Lovenox today X 3 days per [facility].  Patient has ultrasound scheduled today at 3 and a hematologist appt. on 8/11.  Re-check in 1 week.

## 2020-07-04 NOTE — Telephone Encounter (Signed)
Spoke to patient and informed him of his [place] which showed  LEFT: - Findings consistent with acute deep vein thrombosis involving the External iliac, common iliac, distal IVC.  I then spoke to Dr. Ruta Hinds who in turn spoke to Dr. Carlis Abbott who was operating physician on Noah Moore during his recent hospital admission.  Patient has poor inflow to support sent in no plans for any intervention now that it has occluded.  Patient okay to go home since he is at therapeutic levels of Coumadin.  Vascular commended not restarting Lovenox at this time.  They will get him into the office for follow-up with Dr. Carlis Abbott.  Patient has a hematology appointment in 2 days  He complains of severe pain in his left lower extremity and is unable to sleep.  We will send in short course of Norco to help with pain relief

## 2020-07-04 NOTE — Telephone Encounter (Signed)
Due to recurrent nature of his DVT's. Will order stat US. If [place] is scheduled prior to office visit will have him come in. If not then will update him after [place] and cancel appointment.   Cindy, coumadin RN is going to send in Lovenox injections.   Patient has been notified, this is just a [place]

## 2020-07-05 ENCOUNTER — Ambulatory Visit: Payer: 59

## 2020-07-05 NOTE — Progress Notes (Signed)
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 [place] LE showed acute deep vein thrombosis involving the left common femoral vein, left femoral vein, and left popliteal vein. Started on anticoagulation therapy. Admitted 4/21-4/24/2021. D/c on Xarelto 2) 03/17/2020: CTA shows no evidence of PE 3) 03/30/2020: patient presented to ED with worsening pain. Admitted again and underwent peripheral vascular thrombectomy. Transitioned to Apixaban. 4) 04/11/2020: establish care with Dr. Lorenso Courier  5) 06/21/2020-06/26/2020: developed new VTEs requiring hospitalization and thrombolysis/mechanical thrombectomy of the left external iliac vein, common iliac vein, and IVC with vascular surgery on 06/23/2020. He was transitioned to coumadin at that time. 6) 07/04/2020: patient therapeutic on coumadin at INR 2.3, underwent US lower extremity with showed new thrombus in the distal IVC, external iliac and internal iliac veins. 7) 07/06/2020: d/c coumadin, restart therapeutic Lovenox 162m q12H  Interval History:  Noah Fontan89y.o. male with medical history significant for extensive provoked LLE DVT who presents for a follow up visit. The patient's last visit was on 06/15/2020 at which time he was stable on apixaban. In the interim since the last visit Noah Moore developed new VTEs requiring thrombolysis of the left external iliac vein, common iliac vein, and IVC with vascular surgery on 06/23/2020. He was transitioned to coumadin at that time.   On exam today Noah Moore that he is quite frightened by this recurrent of DVT.  He reports that he was doing  quite well after the surgery and he was feeling pretty good while on the Lovenox bridge waiting for his Coumadin to become therapeutic.  He reports that he stopped the Lovenox on Saturday, however after he stopped the Lovenox he began having discomfort in his left lower extremity.  He began developing fevers and swelling as well as large red splotches on his left leg.  He messaged his primary care provider who ordered a lower extremity ultrasound which was performed on 07/04/2020.  The scan returned with new thrombus in the distal IVC, external iliac, and internal iliac veins.  He reports that he is having marked fatigue and that he just wants to lay down all the time.  He reports that his energy is low virtually all the time.  His wife is concerned that he does run low-grade temperatures and is lethargic.  He is also developed these unusual red lesions on his lower extremities which are tender to the touch.  Additionally his weight has trended downward to 284 pounds from 293 pounds on 06/15/2020.  He currently denies having any issues with nausea, vomiting, diarrhea, lymphadenopathy, or pain or splotches elsewhere in his body.  A full 10 point ROS is listed below.  MEDICAL HISTORY:  Past Medical History:  Diagnosis Date  . Allergy   . [facility]   . D[facility] thrombosis) [facility]/[place]    SURGICAL HISTORY: Past Surgical History:  Procedure Laterality Date  . INTRAVASCULAR ULTRASOUND/IVUS N/A 03/30/2020   Procedure: INTRAVASCULAR ULTRASOUND/IVUS;  Surgeon: CMarty Heck MD;  [facility];  Service: Cardiovascular;  Laterality: N/A;  . INTRAVASCULAR ULTRASOUND/IVUS N/A 06/24/2020   Procedure: Intravascular Ultrasound/IVUS;  Surgeon: CMarty Heck MD;  [facility];  Service: Cardiovascular;  Laterality: N/A;  . IVC VENOGRAPHY N/A 03/30/2020   Procedure: IVC Venography;  Surgeon: Marty Heck, MD;  Location: [place]  CV LAB;  Service: Cardiovascular;  Laterality: N/A;  . KNEE SURGERY Right   . LEG SURGERY     Reports history of right lower extremity tibia fracture in 2010 which was repaired surgically.  . LOWER EXTREMITY VENOGRAPHY Left 03/30/2020   Procedure: LOWER EXTREMITY VENOGRAPHY;  Surgeon: Marty Heck, MD;  Location: [place] CV LAB;  Service: Cardiovascular;  Laterality: Left;  . LOWER EXTREMITY VENOGRAPHY  06/23/2020   Procedure: LOWER EXTREMITY VENOGRAPHY;  Surgeon: Marty Heck, MD;  Location: [place] CV LAB;  Service: Cardiovascular;;  . PERIPHERAL VASCULAR THROMBECTOMY Left 03/30/2020   Procedure: PERIPHERAL VASCULAR THROMBECTOMY;  Surgeon: Marty Heck, MD;  Location: [place] CV LAB;  Service: Cardiovascular;  Laterality: Left;  . PERIPHERAL VASCULAR THROMBECTOMY N/A 06/24/2020   Procedure: PERIPHERAL VASCULAR THROMBECTOMY;  Surgeon: Marty Heck, MD;  Location: [place] CV LAB;  Service: Cardiovascular;  Laterality: N/A;  . THROMBECTOMY  03/2020    SOCIAL HISTORY: Social History   Socioeconomic History  . Marital status: Single    Spouse name: Not on file  . Number of children: Not on file  . Years of education: Not on file  . Highest education level: Not on file  Occupational History  . Not on file  Tobacco Use  . Smoking status: Former Smoker    Types: E-cigarettes, Cigarettes  . Smokeless tobacco: Never Used  Vaping Use  . Vaping Use: Former  Substance and Sexual Activity  . Alcohol use: Not Currently  . Drug use: Not Currently  . Sexual activity: Not on file  Other Topics Concern  . Not on file  Social History Narrative   ** Merged History Encounter **       Social Determinants of Health   Financial Resource Strain:   . Difficulty of Paying Living Expenses:   Food Insecurity:   . Worried About Charity fundraiser in the Last Year:   . Arboriculturist in the Last Year:   Transportation Needs:   . Film/video editor  (Medical):   Marland Kitchen Lack of Transportation (Non-Medical):   Physical Activity:   . Days of Exercise per Week:   . Minutes of Exercise per Session:   Stress:   . Feeling of Stress :   Social Connections:   . Frequency of Communication with Friends and Family:   . Frequency of Social Gatherings with Friends and Family:   . Attends Religious Services:   . Active Member of Clubs or Organizations:   . Attends Archivist Meetings:   Marland Kitchen Marital Status:   Intimate Partner Violence:   . Fear of Current or Ex-Partner:   . Emotionally Abused:   Marland Kitchen Physically Abused:   . Sexually Abused:     FAMILY HISTORY: Family History  Adopted: Yes  Problem Relation Age of Onset  . Venous thrombosis Neg Hx     ALLERGIES:  has No Known Allergies.  MEDICATIONS:  Current Outpatient Medications  Medication Sig Dispense Refill  . cetirizine (ZYRTEC) 10 MG tablet Take 1 tablet by mouth daily as needed for allergies.     . cetirizine (ZYRTEC) 10 MG tablet Take 10 mg by mouth daily.    Marland Kitchen docusate sodium (COLACE) 100 MG capsule Take 100 mg by mouth daily as needed for mild  constipation.    . enoxaparin (LOVENOX) 150 MG/ML injection Inject 0.86 mLs (130 mg total) into the skin 2 (two) times daily for 6 doses. (Patient not taking: Reported on 07/06/2020) 5.16 mL 0  . enoxaparin (LOVENOX) 150 MG/ML injection Inject 0.86 mLs (129 mg total) into the skin every 12 (twelve) hours. 28 mL 1  . HYDROcodone-acetaminophen (NORCO) 10-325 MG tablet Take 1 tablet by mouth every 8 (eight) hours as needed for up to 5 days. 15 tablet 0  . warfarin (COUMADIN) 6 MG tablet Take 1 tablet (6 mg total) by mouth daily. 30 tablet 0   No current facility-administered medications for this visit.    REVIEW OF SYSTEMS:   Constitutional: ( - ) fevers, ( - )  chills , ( - ) night sweats Eyes: ( - ) blurriness of vision, ( - ) double vision, ( - ) watery eyes Ears, nose, mouth, throat, and face: ( - ) mucositis, ( - ) sore  throat Respiratory: ( - ) cough, ( - ) dyspnea, ( - ) wheezes Cardiovascular: ( - ) palpitation, ( - ) chest discomfort, ( - ) lower extremity swelling Gastrointestinal:  ( - ) nausea, ( - ) heartburn, ( - ) change in bowel habits Skin: ( - ) abnormal skin rashes Lymphatics: ( - ) new lymphadenopathy, ( - ) easy bruising Neurological: ( - ) numbness, ( - ) tingling, ( - ) new weaknesses Behavioral/Psych: ( - ) mood change, ( - ) new changes  All other systems were reviewed with the patient and are negative.  PHYSICAL EXAMINATION: ECOG PERFORMANCE STATUS: 0 - Asymptomatic  Vitals:   07/06/20 1324  BP: 121/83  Pulse: (!) 113  Resp: 18  Temp: (!) 97.3 F (36.3 C)  SpO2: 100%   Filed Weights   07/06/20 1324  Weight: 284 lb 12.8 oz (129.2 kg)    GENERAL: well appearing young male,  alert, no distress and comfortable SKIN: small raised erythematous lesions on the left lower extremity, up to the level of the mid thigh. Tender to palpation. No skin lesions elsewhere.  EYES: conjunctiva are pink and non-injected, sclera clear LUNGS: clear to auscultation and percussion with normal breathing effort HEART: regular rate & rhythm and no murmurs. Musculoskeletal: no cyanosis of digits and no clubbing. Marked + 3 LE edema of the LLE.  PSYCH: alert & oriented x 3, fluent speech NEURO: no focal motor/sensory deficits  LABORATORY DATA:  I have reviewed the data as listed CBC Latest Ref Rng & Units 07/06/2020 06/25/2020 06/24/2020  WBC 4.0 - 10.5 K/uL 12.1(H) 7.8 10.6(H)  Hemoglobin 13.0 - 17.0 g/dL 13.1 11.1(L) 12.1(L)  Hematocrit 39 - 52 % 40.9 35.2(L) 38.8(L)  Platelets 150 - 400 K/uL 524(H) 353 426(H)    CMP Latest Ref Rng & Units 07/06/2020 06/26/2020 06/25/2020  Glucose 70 - 99 mg/dL 85 87 99  BUN 6 - 20 mg/dL 5(L) <5(L) <5(L)  Creatinine 0.61 - 1.24 mg/dL 0.83 0.73 0.66  Sodium 135 - 145 mmol/L 138 138 137  Potassium 3.5 - 5.1 mmol/L 4.2 3.6 3.9  Chloride 98 - 111 mmol/L 102 104 105   CO2 22 - 32 mmol/L '24 25 24  ' Calcium 8.9 - 10.3 mg/dL 10.1 8.6(L) 8.6(L)  Total Protein 6.5 - 8.1 g/dL 8.9(H) - -  Total Bilirubin 0.3 - 1.2 mg/dL 0.6 - -  Alkaline Phos 38 - 126 U/L 118 - -  AST 15 - 41 U/L 16 - -  ALT 0 -  44 U/L 22 - -    RADIOGRAPHIC STUDIES: CT ABDOMEN PELVIS W CONTRAST  Result Date: 06/21/2020 CLINICAL DATA:  29 year old male with lower abdominal pain. Concern for acute diverticulitis. EXAM: CT ABDOMEN AND PELVIS WITH CONTRAST TECHNIQUE: Multidetector CT imaging of the abdomen and pelvis was performed using the standard protocol following bolus administration of intravenous contrast. CONTRAST:  181m OMNIPAQUE IOHEXOL 300 MG/ML  SOLN COMPARISON:  None. FINDINGS: Lower chest: The visualized lung bases are clear. No intra-abdominal free air or free fluid. Hepatobiliary: Apparent mild fatty liver. No intrahepatic biliary ductal dilatation. The gallbladder is unremarkable. Pancreas: Unremarkable. No pancreatic ductal dilatation or surrounding inflammatory changes. Spleen: Normal in size without focal abnormality. Adrenals/Urinary Tract: The adrenal glands are unremarkable. There is no hydronephrosis on either side. Punctate nonobstructing left renal upper pole calculus. Subcentimeter left renal hypodense lesion is too small to characterize. The visualized ureters and urinary bladder appear unremarkable. Stomach/Bowel: There is no bowel obstruction or active inflammation. The appendix is normal. Vascular/Lymphatic: The abdominal aorta is unremarkable. There is large thrombus extending from the left profundus femoral vein to the junction of the common iliac veins and likely inferior aspect of the IVC. There is distension of the left common iliac vein, left internal and external iliac veins, and left femoral vein with surrounding inflammatory changes in keeping with large thrombus. The thrombus appears almost occlusive in the left common iliac vein. There is edema surrounding the left  iliac venous system which may represent thrombophlebitis. Clinical correlation is recommended. The main portal vein appears patent. No portal venous gas. There is no adenopathy. Reproductive: The prostate and seminal vesicles are grossly unremarkable. Other: Edema of the left lateral pelvic sidewall and presacral space secondary to large DVT. Musculoskeletal: No acute or significant osseous findings. IMPRESSION: 1. Large DVT extending from the LEFT profundus femoral vein to the junction of the common iliac veins and likely into the inferior aspect of the IVC. There is stranding surrounding the left iliac venous system which may be reactive or represent thrombophlebitis. Clinical correlation is recommended. 2. No bowel obstruction. Normal appendix. 3. Punctate nonobstructing left renal upper pole calculus. No hydronephrosis. These results were called by telephone at the time of interpretation on 06/21/2020 at 10:23 pm to provider DVeryl Speak, who verbally acknowledged these results. Electronically Signed   By: AAnner CreteM.D.   On: 06/21/2020 22:23   CARDIAC CATHETERIZATION  Result Date: 06/24/2020 Patient name: MZailen Albarran        MRN: [id number]       DOB: [birth year]           Sex: male  06/24/2020 Pre-operative Diagnosis: Extensive left leg DVT with new iliac vein and distal IVC involvement Post-operative diagnosis:  Same Surgeon:  CMarty Heck MD Procedure Performed: 1. Thrombolytics catheter check of lytics catheter in the left external iliac vein, common iliac vein, and distal IVC 2. Percutaneous mechanical thrombectomy of left external iliac vein, common iliac vein and distal IVC (Innari ClotTriever) 3. IVUS of left external iliac vein, common iliac vein and distal IVC 4. Venogram of left external iliac vein, common iliac vein and IVC 5. Angioplasty of left external iliac vein, common iliac vein and IVC (12 mm x 80 mm Mustang) 6. 55 minutes of monitored moderate conscious sedation time   Indications: 29year old male who previously presented with extensive left leg DVT in the popliteal, superficial femoral vein and common femoral vein. Back in May he underwent left leg intervention  with percutaneous mechanical thrombectomy and at the time had no iliac vein involvement and no signs of May Thurner and the thrombus appeared chronic. He recently presented earlier this week with worsening leg symptoms and new iliac vein involvement with clot into the distal IVC.  Findings:  IVUS was initially performed after the lytics catheter was removed and there was persistent thrombus in the left external iliac, common iliac and distal IVC that appeared both acute and chronic. Given poor outcome with lysis elected to perform percutaneous mechanical thrombectomy and five passes were made from the IVC through the left iliac vein retrieving large volumes of both acute and chronic appearing thrombus. Ultimately elected to angioplasty the left external and common iliac vein into the distal IVC with a 12 mm Mustang. I did not elect to place a stent given there is no inflow with common femoral vein occlusion as noted on imaging yesterday and high risk for thrombosiss.  He now has patent left iliac vein with flow into the IVC and widely patent on venogram.             Procedure:  The patient was identified in the holding area and taken to room 8.  The patient was then placed supine on the table and prepped and draped in the usual sterile fashion.  A time out was called. Initially placed a Glidewire advantage and removed the UniFuse thrombolysis catheter from the left groin sheath. I then performed IVUS of the left external iliac, common iliac and IVC. There was a small flow channel around our lysis catheter but overall there was no significant improvement with large volumes of both acute and chronic thrombus throughout the left iliac vein into the IVC. Given poor response with lysis, I ultimately placed the Glidewire  advantage into the right subclavian vein and elected to perform percutaneous mechanical thrombectomy with Innari ClotTriever and we made a total of five passes retrieving large volumes of both acute and chronic thrombus. IVUS again and it was apparent that he had fair amount of chronic thrombus in the wall the vein throughout the left iliac system that was not retrieved with percutaneous thrombectomy. I then got an updated venogram just to confirm that there was no extravasation from our intervention. I then elected to perform balloon angioplasty of the left iliac vein with a 12 mm Mustang throughout the entire segment to nominal pressure for 2 minutes. Final hand-injection showed patent left iliac vein system with flow into the IVC and no residual stenosis. I think he is going to be high risk for this recurring given no good inflow into the left iliac system with an occluded common femoral vein. Given the left common femoral vein chronic occlusion, I did not want a place a stent because I think this will be high risk for thrombosis. That point time a 4-0 Monocryl pursestring was tied down around the sheath and the sheath was removed and the left groin with good hemostasis. The sheath behind his left knee was also removed.   Plan: Patient will need to stay on heparin at therapeutic dose and this was continued during procedure. Will defer to heme-onc for discharge on anticoagulation.  Marty Heck, MD [facility] Office: [phone]   PERIPHERAL VASCULAR CATHETERIZATION  Result Date: 06/24/2020 Patient name: Rakesh Dutko         MRN: [id number]        DOB: 06/18/1991  Sex: male  06/24/2020 Pre-operative Diagnosis: Extensive left leg DVT with new iliac vein and distal IVC involvement Post-operative diagnosis:  Same Surgeon:  Marty Heck, MD Procedure Performed: 1. Thrombolytics catheter check of lytics catheter in the left external iliac vein, common  iliac vein, and distal IVC 2. Percutaneous mechanical thrombectomy of left external iliac vein, common iliac vein and distal IVC (Innari ClotTriever) 3. IVUS of left external iliac vein, common iliac vein and distal IVC 4. Venogram of left external iliac vein, common iliac vein and IVC 5. Angioplasty of left external iliac vein, common iliac vein and IVC (12 mm x 80 mm Mustang) 6. 55 minutes of monitored moderate conscious sedation time  Indications: 29 year old male who previously presented with extensive left leg DVT in the popliteal, superficial femoral vein and common femoral vein. Back in May he underwent left leg intervention with percutaneous mechanical thrombectomy and at the time had no iliac vein involvement and no signs of May Thurner and the thrombus appeared chronic. He recently presented earlier this week with worsening leg symptoms and new iliac vein involvement with clot into the distal IVC.  Findings:  IVUS was initially performed after the lytics catheter was removed and there was persistent thrombus in the left external iliac, common iliac and distal IVC that appeared both acute and chronic. Given poor outcome with lysis elected to perform percutaneous mechanical thrombectomy and five passes were made from the IVC through the left iliac vein retrieving large volumes of both acute and chronic appearing thrombus. Ultimately elected to angioplasty the left external and common iliac vein into the distal IVC with a 12 mm Mustang. I did not elect to place a stent given there is no inflow with common femoral vein occlusion as noted on imaging yesterday and high risk for thrombosiss.  He now has patent left iliac vein with flow into the IVC and widely patent on venogram.             Procedure:  The patient was identified in the holding area and taken to room 8.  The patient was then placed supine on the table and prepped and draped in the usual sterile fashion.  A time out was called. Initially placed  a Glidewire advantage and removed the UniFuse thrombolysis catheter from the left groin sheath. I then performed IVUS of the left external iliac, common iliac and IVC. There was a small flow channel around our lysis catheter but overall there was no significant improvement with large volumes of both acute and chronic thrombus throughout the left iliac vein into the IVC. Given poor response with lysis, I ultimately placed the Glidewire advantage into the right subclavian vein and elected to perform percutaneous mechanical thrombectomy with Innari ClotTriever and we made a total of five passes retrieving large volumes of both acute and chronic thrombus. IVUS again and it was apparent that he had fair amount of chronic thrombus in the wall the vein throughout the left iliac system that was not retrieved with percutaneous thrombectomy. I then got an updated venogram just to confirm that there was no extravasation from our intervention. I then elected to perform balloon angioplasty of the left iliac vein with a 12 mm Mustang throughout the entire segment to nominal pressure for 2 minutes. Final hand-injection showed patent left iliac vein system with flow into the IVC and no residual stenosis. I think he is going to be high risk for this recurring given no good inflow into the left iliac system  with an occluded common femoral vein. Given the left common femoral vein chronic occlusion, I did not want a place a stent because I think this will be high risk for thrombosis. That point time a 4-0 Monocryl pursestring was tied down around the sheath and the sheath was removed and the left groin with good hemostasis. The sheath behind his left knee was also removed.   Plan: Patient will need to stay on heparin at therapeutic dose and this was continued during procedure. Will defer to heme-onc for discharge on anticoagulation.  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  Ultrasound-guided access of left popliteal vein 2.  Ultrasound-guided access of left common femoral vein 3.  Left femoral and iliac venogram 4.  IVUS of left external iliac vein, left common iliac vein and IVC 5.  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  He recently presented here with now worsening symptoms and evidence of acute DVT in the left iliac and IVC that is new.  He presents for planned evaluation with venogram IVUS and possible thrombolysis.  Findings:  Initially accessed the left popliteal in the prone position and ultimately I could not get across the left common femoral vein occlusion from popliteal access and this appeared chronic with large collaterals.  I left my sheath and flipped him over and accessed left common femoral vein and again could not get a wire to track.  I was able to stick higher on the left common femoral0 vein and finally got a wire into the external iliac common iliac vein and into the IVC.  On IVUS there appears to be acute on chronic thrombus in the left external and common iliac vein and distal IVC.  A thrombolytic's catheter was placed.             Procedure:  The patient was identified in the holding area and taken to room 8.  The patient was then placed prone on the table and prepped and draped in the usual sterile  fashion.  A time out was called.  Ultrasound was used to evaluate the left popliteal vein.  Initially accessed the left popliteal vein with a micro needle placed a microwire.  I could not get this wire to track across the popliteal thrombus and suspected this was chronic and met significant resistance.  Finally I was able to get in the small saphenous and got a wire and micro sheath in place and finally placed an 8 French sheath and tried to get a Glidewire advantage up the femoral vein but could not get across the common femoral vein occlusion.  Venogram showed large collateral and this appeared chronic.  I then flipped him over both groins were prepped and draped and accessed the left common femoral vein and got venous backbleeding but  again could not get a wire to track and suspect this was chronically occluded and it was not until I accessed the vein much higher that I got a wire into the iliac vein without significant resistance.  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  Subsequently did perform venogram to assure there was no extravasation prior to initiation of lysis.  I then placed a thrombolytics catheter in the left external and common iliac vein into the IVC for a total treatment length of 30 cm.  All sheaths were secured and be taken to ICU.  Plan: Lytics check tomorrow. Run TPA at 0.25 mg/hr and heparin at 800 units/hr through the sheath.     Marty Heck, MD [facility] Office: [phone]   VAS [place] IVC/ILIAC (VENOUS ONLY)  Result Date: 07/05/2020 IVC/ILIAC STUDY Indications: History of DVT with left lower extremity swelling, continuous flow              in left CFV on lower extremity venous duplex. Other Factors: Stopped Lovenox 07/02/2020, left lower extremity swelling began                 07/02/2020. Currently on Coumadin, INR=2.2.  Comparison Study: 06/23/2020- acute thrombus left common iliac and external iliac                   veins. Performing Technologist: Maudry Mayhew MHA, RDMS, RVT, RDCS  Examination Guidelines: A complete evaluation includes B-mode imaging, spectral Doppler, color Doppler, and power Doppler as needed of all accessible portions of each vessel. Bilateral testing is considered an integral part of a complete examination. Limited examinations for reoccurring indications may be performed as noted.  IVC/Iliac Findings: +----------+------+--------+--------+    IVC    PatentThrombusComments +----------+------+--------+--------+ IVC Prox  patent                 +----------+------+--------+--------+ IVC Mid   patent                 +----------+------+--------+--------+ IVC Distal       acute           +----------+------+--------+--------+  +------------+---------+-----------+---------+-----------+--------+     CIV     RT-PatentRT-ThrombusLT-PatentLT-ThrombusComments +------------+---------+-----------+---------+-----------+--------+ Common Iliac patent                         acute            +------------+---------+-----------+---------+-----------+--------+  +-------------------+---------+-----------+---------+-----------+--------+         EIV        RT-PatentRT-ThrombusLT-PatentLT-ThrombusComments +-------------------+---------+-----------+---------+-----------+--------+ External Iliac Vein patent                         acute            +-------------------+---------+-----------+---------+-----------+--------+   Summary: IVC/Iliac: There is evidence of acute thrombus involving the distal IVC. There is evidence of acute thrombus involving the left common iliac vein. There is evidence of acute thrombus involving the left external iliac vein. There is no evidence of thrombus involving the right common iliac vein. There is no  evidence of thrombus involving the right external iliac vein.  *See table(s) above for measurements and observations.  Electronically signed by Harold Barban MD on 07/05/2020 at 10:24:59  PM.    Final    VAS [place] IVC/ILIAC (VENOUS ONLY)  Result Date: 06/23/2020 IVC/ILIAC STUDY Indications: History of left lower ext. DVT extending into the iliac veins and              possible IVC on CT scan dated 06/21/20 Limitations: Obesity and air/bowel gas.  Performing Technologist: Oda Cogan RDMS, RVT  Examination Guidelines: A complete evaluation includes B-mode imaging, spectral Doppler, color Doppler, and power Doppler as needed of all accessible portions of each vessel. Bilateral testing is considered an integral part of a complete examination. Limited examinations for reoccurring indications may be performed as noted.  IVC/Iliac Findings: +----------+------+--------+------------------------------------------------+    IVC    PatentThrombus                    Comments                     +----------+------+--------+------------------------------------------------+ IVC Prox  patent                                                         +----------+------+--------+------------------------------------------------+ IVC Mid   patent                                                         +----------+------+--------+------------------------------------------------+ IVC Distalpatent        appears patent, cannot exclude partial thrombus. +----------+------+--------+------------------------------------------------+  +-------------------+---------+-----------+---------+-----------+--------+         CIV        RT-PatentRT-ThrombusLT-PatentLT-ThrombusComments +-------------------+---------+-----------+---------+-----------+--------+ Common Iliac Prox   patent              patent                      +-------------------+---------+-----------+---------+-----------+--------+ Common Iliac Mid     patent              patent                      +-------------------+---------+-----------+---------+-----------+--------+ Common Iliac Distal patent              patent                      +-------------------+---------+-----------+---------+-----------+--------+  +-------------------------+---------+-----------+---------+-----------+--------+            EIV           RT-PatentRT-ThrombusLT-PatentLT-ThrombusComments +-------------------------+---------+-----------+---------+-----------+--------+ External Iliac Vein Prox             acute               acute            +-------------------------+---------+-----------+---------+-----------+--------+ External Iliac Vein Mid              acute               acute            +-------------------------+---------+-----------+---------+-----------+--------+ External Iliac Vein                  acute               acute  Distal                                                                    +-------------------------+---------+-----------+---------+-----------+--------+   Summary: IVC/Iliac: There is no evidence of thrombus involving the IVC. There is evidence of acute thrombus involving the left common iliac vein. There is evidence of acute thrombus involving the left external iliac vein.  *See table(s) above for measurements and observations.  Electronically signed by Monica Martinez MD on 06/23/2020 at 3:47:19 PM.    Final    VAS [place] LOWER EXTREMITY VENOUS (DVT)  Result Date: 07/05/2020  Lower Venous DVTStudy Indications: Swelling, Pain, and Leg swelling after discontinuing Lovenox, status post peripheral thrombectomy 06/24/20 and 03/30/20.  Comparison Study: Prior study from 06/23/20 Performing Technologist: Sharion Dove RVS  Examination Guidelines: A complete evaluation includes B-mode imaging, spectral Doppler, color Doppler, and power Doppler as needed of all accessible portions of each vessel. Bilateral  testing is considered an integral part of a complete examination. Limited examinations for reoccurring indications may be performed as noted. The reflux portion of the exam is performed with the patient in reverse Trendelenburg.  +-----+---------------+---------+-----------+----------+--------------+ RIGHTCompressibilityPhasicitySpontaneityPropertiesThrombus Aging +-----+---------------+---------+-----------+----------+--------------+ CFV  Full           Yes      Yes                                 +-----+---------------+---------+-----------+----------+--------------+   +---------+---------------+---------+-----------+----------+---------------+ LEFT     CompressibilityPhasicitySpontaneityPropertiesThrombus Aging  +---------+---------------+---------+-----------+----------+---------------+ CFV      Full                                         continuous flow +---------+---------------+---------+-----------+----------+---------------+ SFJ      Full                                                         +---------+---------------+---------+-----------+----------+---------------+ FV Prox  Full                                         continuous flow +---------+---------------+---------+-----------+----------+---------------+ FV Mid   Full                                         continuous flow +---------+---------------+---------+-----------+----------+---------------+ FV DistalFull                                         continuous flow +---------+---------------+---------+-----------+----------+---------------+ PFV      Full                                                         +---------+---------------+---------+-----------+----------+---------------+  POP      Full                                         continuous flow +---------+---------------+---------+-----------+----------+---------------+ PTV      Full                                                          +---------+---------------+---------+-----------+----------+---------------+ PERO     Full                                                         +---------+---------------+---------+-----------+----------+---------------+     Summary: RIGHT: - No evidence of deep vein thrombosis in the lower extremity. No indirect evidence of obstruction proximal to the inguinal ligament.  LEFT: - There is no evidence of deep vein thrombosis in the lower extremity.  - Continuous flow noted throughout the left lower extremity.  *See table(s) above for measurements and observations. Electronically signed by Harold Barban MD on 07/05/2020 at 10:24:43 PM.    Final    VAS [place] LOWER EXTREMITY VENOUS (DVT)  Result Date: 06/23/2020  Lower Venous DVTStudy Indications: Swelling, and History of left leg DVT found on CT scan dated 7/27.  Risk Factors: DVT left. Comparison Study: No prior. Performing Technologist: Oda Cogan RDMS, RVT  Examination Guidelines: A complete evaluation includes B-mode imaging, spectral Doppler, color Doppler, and power Doppler as needed of all accessible portions of each vessel. Bilateral testing is considered an integral part of a complete examination. Limited examinations for reoccurring indications may be performed as noted. The reflux portion of the exam is performed with the patient in reverse Trendelenburg.  +-----+---------------+---------+-----------+----------+--------------+ RIGHTCompressibilityPhasicitySpontaneityPropertiesThrombus Aging +-----+---------------+---------+-----------+----------+--------------+ CFV  Full           No       No                                  +-----+---------------+---------+-----------+----------+--------------+   +---------+---------------+---------+-----------+----------+-----------------+ LEFT     CompressibilityPhasicitySpontaneityPropertiesThrombus Aging     +---------+---------------+---------+-----------+----------+-----------------+ CFV      None           No       No                   Acute             +---------+---------------+---------+-----------+----------+-----------------+ SFJ      Full                                                           +---------+---------------+---------+-----------+----------+-----------------+ FV Prox  Partial                                      Chronic           +---------+---------------+---------+-----------+----------+-----------------+  FV Mid   Full                                                           +---------+---------------+---------+-----------+----------+-----------------+ FV DistalFull                                                           +---------+---------------+---------+-----------+----------+-----------------+ PFV      Partial                                      Age Indeterminate +---------+---------------+---------+-----------+----------+-----------------+ POP      None           No       No                   Age Indeterminate +---------+---------------+---------+-----------+----------+-----------------+ PTV      Full                                                           +---------+---------------+---------+-----------+----------+-----------------+ PERO     Full                                                           +---------+---------------+---------+-----------+----------+-----------------+     Summary: RIGHT: - No evidence of common femoral vein obstruction.  LEFT: - Findings consistent with acute deep vein thrombosis involving the left common femoral vein, and left popliteal vein. - Findings consistent with age indeterminate deep vein thrombosis involving the left femoral vein, and left proximal profunda vein.  *See table(s) above for measurements and observations. Electronically signed by Monica Martinez MD on 06/23/2020 at  3:47:59 PM.    Final     ASSESSMENT & PLAN Noah Moore 29 y.o. male with medical history significant for extensive provoked LLE DVT who presents for a follow up visit.  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  On exam today Noah Moore is accompanied by his wife via telephone.  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  I am deeply concerned about the etiology of his blood clot and his resistance to Eliquis and Coumadin therapy.  In order to be safe I would recommend that we transition him back to Lovenox therapy while we pursue an inflammatory work-up for other possible etiologies.  During the patient's hospitalization he was seen by Dr. Monica Martinez with vascular surgery who performed thrombolysis and percutaneous mechanical thrombectomy of the left external iliac, common iliac, distal IVC on 06/24/2020.  Due to concern the fact that the occurred while on anticoagulation therapy with Eliquis he  was transitioned to Coumadin therapy at time of discharge and is subsequently established with Coumadin clinic in the outpatient setting.  After d/c of Lovenox on Saturday the patient began to have worsening leg swelling and developed tender erythematous raised lesions.   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  --prior hypercoagulation workup was unrevealing. Will begin an inflammatory workup for a possible underlying condition resulting in these clots. Start with ESR, CRP, RF, Anti-CCP, ANCA. Will make referral to rheumatology for further evaluation. Vasculitis could explain his new painful lesions, elevated inflammatory markers, and recurrent VTE --if no clear answer  can be reached will need to make referral to tertiary care center for hematological evaluation. Would recommend [facility].  --vascular surgery evaluated for a compressive syndrome (such as May Thurner). No evidence of this was found. Appreciate their assistance in this case.  --RTC placeholder scheduled in 1 months time.   Orders Placed This Encounter  Procedures  . ANA, IFA (with reflex)    Standing Status:   Future    Number of Occurrences:   1    Standing Expiration Date:   07/06/2021  . Rheumatoid factor    Standing Status:   Future    Number of Occurrences:   1    Standing Expiration Date:   07/06/2021  . Cyclic Citrul Peptide Ab, IgG, IgA    Standing Status:   Future    Number of Occurrences:   1    Standing Expiration Date:   07/06/2021  . Sedimentation rate    Standing Status:   Future    Number of Occurrences:   1    Standing Expiration Date:   07/06/2021  . C-reactive protein    Standing Status:   Future    Number of Occurrences:   1    Standing Expiration Date:   07/06/2021  . ANCA [place]    Standing Status:   Future    Number of Occurrences:   1    Standing Expiration Date:   07/06/2021    All questions were answered. The patient knows to call the clinic with any problems, questions or concerns.  A total of more than 30 minutes were spent on this encounter and over half of that time was spent on counseling and coordination of care as outlined above.   Ledell Peoples, MD Department of Hematology/Oncology [place] [facility] Phone: [phone] Pager: [phone] Email: [email or web address]  07/06/2020 6:22 PM

## 2020-07-06 ENCOUNTER — Inpatient Hospital Stay: Payer: 59

## 2020-07-06 ENCOUNTER — Inpatient Hospital Stay: Payer: 59 | Attending: Hematology and Oncology | Admitting: Hematology and Oncology

## 2020-07-06 ENCOUNTER — Other Ambulatory Visit: Payer: Self-pay | Admitting: Hematology and Oncology

## 2020-07-06 ENCOUNTER — Other Ambulatory Visit: Payer: Self-pay

## 2020-07-06 ENCOUNTER — Encounter: Payer: Self-pay | Admitting: Hematology and Oncology

## 2020-07-06 VITALS — BP 121/83 | HR 113 | Temp 97.3°F | Resp 18 | Ht 76.0 in | Wt 284.8 lb

## 2020-07-06 DIAGNOSIS — Z7901 Long term (current) use of anticoagulants: Secondary | ICD-10-CM | POA: Diagnosis not present

## 2020-07-06 DIAGNOSIS — Z87891 Personal history of nicotine dependence: Secondary | ICD-10-CM | POA: Diagnosis not present

## 2020-07-06 DIAGNOSIS — I82412 Acute embolism and thrombosis of left femoral vein: Secondary | ICD-10-CM | POA: Insufficient documentation

## 2020-07-06 DIAGNOSIS — I824Z2 Acute embolism and thrombosis of unspecified deep veins of left distal lower extremity: Secondary | ICD-10-CM

## 2020-07-06 DIAGNOSIS — R5383 Other fatigue: Secondary | ICD-10-CM | POA: Insufficient documentation

## 2020-07-06 DIAGNOSIS — I8290 Acute embolism and thrombosis of unspecified vein: Secondary | ICD-10-CM | POA: Diagnosis not present

## 2020-07-06 LAB — CMP (CANCER CENTER ONLY)
ALT: 22 U/L (ref 0–44)
AST: 16 U/L (ref 15–41)
Albumin: 3.6 g/dL (ref 3.5–5.0)
Alkaline Phosphatase: 118 U/L (ref 38–126)
Anion gap: 12 (ref 5–15)
BUN: 5 mg/dL — ABNORMAL LOW (ref 6–20)
CO2: 24 mmol/L (ref 22–32)
Calcium: 10.1 mg/dL (ref 8.9–10.3)
Chloride: 102 mmol/L (ref 98–111)
Creatinine: 0.83 mg/dL (ref 0.61–1.24)
GFR, Est AFR Am: >60 mL/min (ref >60)
GFR, Estimated: >60 mL/min (ref >60)
Glucose, Bld: 85 mg/dL (ref 70–99)
Potassium: 4.2 mmol/L (ref 3.5–5.1)
Sodium: 138 mmol/L (ref 135–145)
Total Bilirubin: 0.6 mg/dL (ref 0.3–1.2)
Total Protein: 8.9 g/dL — ABNORMAL HIGH (ref 6.5–8.1)

## 2020-07-06 LAB — CBC WITH DIFFERENTIAL (CANCER CENTER ONLY)
Abs Immature Granulocytes: 0.05 10*3/uL (ref 0.00–0.07)
Basophils Absolute: 0.1 10*3/uL (ref 0.0–0.1)
Basophils Relative: 0 %
Eosinophils Absolute: 0.1 10*3/uL (ref 0.0–0.5)
Eosinophils Relative: 1 %
HCT: 40.9 % (ref 39.0–52.0)
Hemoglobin: 13.1 g/dL (ref 13.0–17.0)
Immature Granulocytes: 0 %
Lymphocytes Relative: 16 %
Lymphs Abs: 1.9 10*3/uL (ref 0.7–4.0)
MCH: 27.8 pg (ref 26.0–34.0)
MCHC: 32 g/dL (ref 30.0–36.0)
MCV: 86.8 fL (ref 80.0–100.0)
Monocytes Absolute: 1 10*3/uL (ref 0.1–1.0)
Monocytes Relative: 9 %
Neutro Abs: 9 10*3/uL — ABNORMAL HIGH (ref 1.7–7.7)
Neutrophils Relative %: 74 %
Platelet Count: 524 10*3/uL — ABNORMAL HIGH (ref 150–400)
RBC: 4.71 MIL/uL (ref 4.22–5.81)
RDW: 13.8 % (ref 11.5–15.5)
WBC Count: 12.1 10*3/uL — ABNORMAL HIGH (ref 4.0–10.5)
nRBC: 0 % (ref 0.0–0.2)

## 2020-07-06 LAB — SEDIMENTATION RATE: Sed Rate: 70 mm/hr — ABNORMAL HIGH (ref 0–16)

## 2020-07-06 LAB — C-REACTIVE PROTEIN: CRP: 13.8 mg/dL — ABNORMAL HIGH (ref <1.0)

## 2020-07-06 MED ORDER — ENOXAPARIN SODIUM 150 MG/ML ~~LOC~~ SOLN
1.0000 mg/kg | Freq: Two times a day (BID) | SUBCUTANEOUS | 1 refills | Status: DC
Start: 2020-07-06 — End: 2020-07-26

## 2020-07-07 ENCOUNTER — Telehealth: Payer: Self-pay | Admitting: Hematology and Oncology

## 2020-07-07 LAB — ANCA TITERS
Atypical P-ANCA titer: <1:20 {titer}
C-ANCA: <1:20 {titer}
P-ANCA: <1:20 {titer}

## 2020-07-07 LAB — RHEUMATOID FACTOR: Rheumatoid fact SerPl-aCnc: <10 IU/mL (ref 0.0–13.9)

## 2020-07-07 LAB — ANTINUCLEAR ANTIBODIES, IFA: ANA Ab, IFA: NEGATIVE

## 2020-07-07 NOTE — Telephone Encounter (Signed)
Scheduled per los. Called and left msg. Mailed printout  °

## 2020-07-08 ENCOUNTER — Encounter: Payer: Self-pay | Admitting: Adult Health

## 2020-07-08 LAB — CYCLIC CITRUL PEPTIDE ANTIBODY, IGG/IGA: CCP Antibodies IgG/IgA: 6 units (ref 0–19)

## 2020-07-11 ENCOUNTER — Telehealth: Payer: Self-pay | Admitting: *Deleted

## 2020-07-11 ENCOUNTER — Other Ambulatory Visit: Payer: Self-pay | Admitting: *Deleted

## 2020-07-11 ENCOUNTER — Ambulatory Visit (INDEPENDENT_AMBULATORY_CARE_PROVIDER_SITE_OTHER): Payer: 59 | Admitting: General Practice

## 2020-07-11 ENCOUNTER — Other Ambulatory Visit: Payer: Self-pay

## 2020-07-11 DIAGNOSIS — Z7901 Long term (current) use of anticoagulants: Secondary | ICD-10-CM

## 2020-07-11 DIAGNOSIS — I824Z2 Acute embolism and thrombosis of unspecified deep veins of left distal lower extremity: Secondary | ICD-10-CM

## 2020-07-11 LAB — POCT INR: INR: 2.1 (ref 2.0–3.0)

## 2020-07-11 NOTE — Progress Notes (Signed)
ambulatory

## 2020-07-11 NOTE — Patient Instructions (Addendum)
Pre visit review using our clinic review tool, if applicable. No additional management support is needed unless otherwise documented below in the visit note.  Take 1 1/2 tablets today and then change dosage and take 1 tablet daily.  Hematologist re-started patient on Lovenox.

## 2020-07-11 NOTE — Telephone Encounter (Signed)
Pt was referred to Dr. Rondel Baton @ [facility].  No appts available until 06/2021.  Please advise next steps

## 2020-07-12 ENCOUNTER — Telehealth: Payer: Self-pay

## 2020-07-12 ENCOUNTER — Ambulatory Visit: Payer: 59 | Admitting: Vascular Surgery

## 2020-07-12 ENCOUNTER — Encounter: Payer: Self-pay | Admitting: Vascular Surgery

## 2020-07-12 VITALS — BP 120/85 | HR 91 | Temp 97.9°F | Resp 16 | Ht 76.0 in | Wt 285.0 lb

## 2020-07-12 DIAGNOSIS — I82422 Acute embolism and thrombosis of left iliac vein: Secondary | ICD-10-CM | POA: Diagnosis not present

## 2020-07-12 DIAGNOSIS — I824Z2 Acute embolism and thrombosis of unspecified deep veins of left distal lower extremity: Secondary | ICD-10-CM | POA: Diagnosis not present

## 2020-07-12 NOTE — Progress Notes (Signed)
Patient name: Noah Moore MRN: [id number] DOB: 11/08/1991 Sex: male  REASON FOR VISIT: F/U after left iliac vein/IVC lysis with percutaneous mechanical vein thrombectomy and venoplasty  HPI: Noah Moore is a 29 y.o. male that presents for follow-up after left iliac vein and IVC lysis with percutaneous mechanical thrombectomy and venoplasty on 06/23/20.  He has a very complex history.  He initially was diagnosed with a DVT in April 2021 in the left common femoral, superficial femoral and popliteal vein.  Ultimately was seen in the ED and discharged after seeing improvement on heparin and no intervention was initially performed.  On follow-up in the clinic he had worsening symptoms and had percutaneous mechanical thrombectomy of the left lower extremity with balloon angioplasty of the left femoral-popliteal vein on 03/30/2020.  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  There was no thrombus in the iliac or IVC.  No evidence of May Thurner.  This DVT was thought to be provoked from a long drive (>5 hours) after further evaluation with hem-onc.  He has unknown family history as he is adopted.  He was initially put on Xarelto but was transitioned to Eliquis due to bleeding gums.   He subsequently presented to the ER at the end of July where he had a CT of the abdomen and pelvis with contrast that showed extensive DVT of profunda femoral vein to the junction of the common iliac veins and likely into the inferior aspect of the IVC. He has was started on IV heparin.  Ultimately we attempted another intervention via popliteal access but could not get across a left common femoral vein occlusion with a large pelvic collateral.  Ultimately was able to access the common femoral vein much more proximal near the iliac vein junction above the occlusion and ultimately performed lysis and then percutaneous mechanical thrombectomy and venoplasty.   All of this then reoccluded within a week while being therapeutic on Coumadin after further input from hem onc.   He has been seen by Dr. Lorenso Courier again with heme-onc and placed on Lovenox in the interim.  He has seen some significant improvement in the left leg over the last week while being on Lovenox.  Not wearing compression at this time.  States his leg is tolerable today.  Worse when he is up for long periods of time without wearing compression.  Past Medical History:  Diagnosis Date  . Allergy   . DVT (deep venous thrombosis) ([place])   . DVT (deep venous thrombosis) ([place]) 02/2020    Past Surgical History:  Procedure Laterality Date  . INTRAVASCULAR ULTRASOUND/IVUS N/A 03/30/2020   Procedure: INTRAVASCULAR ULTRASOUND/IVUS;  Surgeon: Marty Heck, MD;  Location: [place] [facility];  Service: Cardiovascular;  Laterality: N/A;  . INTRAVASCULAR ULTRASOUND/IVUS N/A 06/24/2020   Procedure: Intravascular Ultrasound/IVUS;  Surgeon: Marty Heck, MD;  Location: [place] [facility];  Service: Cardiovascular;  Laterality: N/A;  . IVC VENOGRAPHY N/A 03/30/2020   Procedure: IVC Venography;  Surgeon: Marty Heck, MD;  Location: [place] [facility];  Service: Cardiovascular;  Laterality: N/A;  . KNEE SURGERY Right   . LEG SURGERY     Reports history of right lower extremity tibia fracture in 2010 which was repaired surgically.  . LOWER EXTREMITY VENOGRAPHY Left 03/30/2020   Procedure: LOWER EXTREMITY VENOGRAPHY;  Surgeon: Marty Heck, MD;  Location: [place] [facility];  Service: Cardiovascular;  Laterality: Left;  .  LOWER EXTREMITY VENOGRAPHY  06/23/2020   Procedure: LOWER EXTREMITY VENOGRAPHY;  Surgeon: Marty Heck, MD;  Location: [place] [facility];  Service: Cardiovascular;;  . PERIPHERAL VASCULAR THROMBECTOMY Left 03/30/2020   Procedure: PERIPHERAL VASCULAR THROMBECTOMY;  Surgeon: Marty Heck, MD;  Location: [place] [facility];  Service: Cardiovascular;   Laterality: Left;  . PERIPHERAL VASCULAR THROMBECTOMY N/A 06/24/2020   Procedure: PERIPHERAL VASCULAR THROMBECTOMY;  Surgeon: Marty Heck, MD;  Location: [place] [facility];  Service: Cardiovascular;  Laterality: N/A;  . THROMBECTOMY  03/2020    Family History  Adopted: Yes  Problem Relation Age of Onset  . Venous thrombosis Neg Hx     SOCIAL HISTORY: Social History   Tobacco Use  . Smoking status: Former Smoker    Types: E-cigarettes, Cigarettes  . Smokeless tobacco: Never Used  Substance Use Topics  . Alcohol use: Not Currently    No Known Allergies  Current Outpatient Medications  Medication Sig Dispense Refill  . cetirizine (ZYRTEC) 10 MG tablet Take 1 tablet by mouth daily as needed for allergies.     . cetirizine (ZYRTEC) 10 MG tablet Take 10 mg by mouth daily.    Marland Kitchen docusate sodium (COLACE) 100 MG capsule Take 100 mg by mouth daily as needed for mild constipation.    . enoxaparin (LOVENOX) 150 MG/ML injection Inject 0.86 mLs (129 mg total) into the skin every 12 (twelve) hours. 28 mL 1  . warfarin (COUMADIN) 6 MG tablet Take 1 tablet (6 mg total) by mouth daily. 30 tablet 0  . enoxaparin (LOVENOX) 150 MG/ML injection Inject 0.86 mLs (130 mg total) into the skin 2 (two) times daily for 6 doses. (Patient not taking: Reported on 07/06/2020) 5.16 mL 0   No current facility-administered medications for this visit.    REVIEW OF SYSTEMS:  [X]  denotes positive finding, [ ]  denotes negative finding Cardiac  Comments:  Chest pain or chest pressure:    Shortness of breath upon exertion:    Short of breath when lying flat:    Irregular heart rhythm:        Vascular    Pain in calf, thigh, or hip brought on by ambulation:    Pain in feet at night that wakes you up from your sleep:     Blood clot in your veins:    Leg swelling:  x Left       Pulmonary    Oxygen at home:    Productive cough:     Wheezing:         Neurologic    Sudden weakness in arms or legs:      Sudden numbness in arms or legs:     Sudden onset of difficulty speaking or slurred speech:    Temporary loss of vision in one eye:     Problems with dizziness:         Gastrointestinal    Blood in stool:     Vomited blood:         Genitourinary    Burning when urinating:     Blood in urine:        Psychiatric    Major depression:         Hematologic    Bleeding problems:    Problems with blood clotting too easily:        Skin    Rashes or ulcers:        Constitutional    Fever or chills:  PHYSICAL EXAM: Vitals:   07/12/20 1058  BP: 120/85  Pulse: 91  Resp: 16  Temp: 97.9 F (36.6 C)  TempSrc: Temporal  SpO2: 99%  Weight: 285 lb (129.3 kg)  Height: 6\' 4"  (1.93 m)    GENERAL: The patient is a well-nourished male, in no acute distress. The vital signs are documented above. CARDIAC: There is a regular rate and rhythm.  VASCULAR:  Persistent left leg edema as pictured below but significantly improvement over the last week Palpable left dorsalis pedis and posterior tibial pulses with no signs of phlegmasia and soft compartments      DATA:   No new imaging today. IVC/ILIAC STUDY   Indications: History of DVT with left lower extremity swelling, continuous  flow        in left CFV on lower extremity venous duplex.   Other Factors: Stopped Lovenox 07/02/2020, left lower extremity swelling  began         07/02/2020. Currently on Coumadin, INR=2.2.     Comparison Study: 06/23/2020- acute thrombus left common iliac and external  iliac          veins.   Performing Technologist: Maudry Mayhew MHA, RDMS, RVT, RDCS     Examination Guidelines: A complete evaluation includes B-mode imaging,  spectral  Doppler, color Doppler, and power Doppler as needed of all accessible  portions  of each vessel. Bilateral testing is considered an integral part of a  complete  examination. Limited examinations for reoccurring indications may  be  performed  as noted.     IVC/Iliac Findings:  +----------+------+--------+--------+    IVC  PatentThrombusComments  +----------+------+--------+--------+  IVC Prox patent          +----------+------+--------+--------+  IVC Mid  patent          +----------+------+--------+--------+  IVC Distal    acute       +----------+------+--------+--------+     +------------+---------+-----------+---------+-----------+--------+    CIV   RT-PatentRT-ThrombusLT-PatentLT-ThrombusComments  +------------+---------+-----------+---------+-----------+--------+  Common Iliac patent              acute        +------------+---------+-----------+---------+-----------+--------+      +-------------------+---------+-----------+---------+-----------+--------+      EIV    RT-PatentRT-ThrombusLT-PatentLT-ThrombusComments  +-------------------+---------+-----------+---------+-----------+--------+  External Iliac Vein patent              acute        +-------------------+---------+-----------+---------+-----------+--------+      Summary:  IVC/Iliac:  There is evidence of acute thrombus involving the distal IVC.  There is evidence of acute thrombus involving the left common iliac vein.   There is evidence of acute thrombus involving the left external iliac  vein.   There is no evidence of thrombus involving the right common iliac vein.  There is no evidence of thrombus involving the right external iliac vein.   Assessment/Plan:  29 year old male presents for follow-up after recent thrombolysis and percutaneous mechanical thrombectomy of the left iliac vein and distal IVC on 7/29-7/30/21.  Unfortunately he has since reoccluded this as demonstrated on duplex even while being therapeutic on coumadin.  I had a long discussion with him and reviewed his previous venogram pictures  from back in May 2021 when we first performed intervention.  At the time when I initially performed intervention in the left leg he had mostly chronic thrombus in the left popliteal superficial femoral and common femoral vein.  At that time the iliac and IVC was patent and there was no evidence of May Thurner.  When I reintervened  last month due to new iliac IVC involvement I could not get across his left common femoral vein from popliteal approach and this appears chronically occluded now with large collaterals into the pelvis.  I did access his left common femoral vein above the occluded segment and got a nice flow channel with thrombolysis and percutaneous mechanical thrombectomy.  I discussed with him today that I suspect this reoccluded given that he has no inflow with a common femoral vein occlusion on the left.  Without good inflow would be difficult to keep the left iliac system patent.  Given significant improvement on Lovenox I would favor no surgical intervention at this time.  He has collaterals and his leg looks much better today.  Instruct that he stay on Lovenox for the foreseeable future given its anti-inflammatory properties and restart compression in the left leg which he has not been doing much.   I also appreciate Dr. Deloria Lair input for any other etiology that we may be missing.   I will see him in a couple weeks to reevaluate.  I think if we were to attempt any other intervention it would need to be within the next few weeks but again with such improvement on Lovenox I am not sure that additional interventions are in his best interest as you are at risk of losing collaterals at each attempt.  I think if he does require intervention, I have reviewed his images from my partners and the only thing we could offer would be coming from his IJ and popliteal vein at the same time to try and get across this common femoral occlusion.  I would not favor this given has had multiple failed  attempts and seems to be improving on Lovenox.  He asked about another opinion at [facility] after discussion with his hematologist and I certainly offered to refer him to a tertiary care center if he would like another opinion.   Marty Heck, MD [facility] Office: [phone]

## 2020-07-12 NOTE — Telephone Encounter (Signed)
Mr Pesola called requesting blood test results.  Forwarded to Dr. Lorenso Courier and Drucie Ip, RN

## 2020-07-15 ENCOUNTER — Telehealth: Payer: Self-pay | Admitting: *Deleted

## 2020-07-15 NOTE — Telephone Encounter (Signed)
Received call from patient inquiring about lab results. Reviewed basic labs with him but advised that the other labs Dr. Lorenso Courier will have to interpret and discuss with him next week as he is out of the office. Noah Moore voiced understanding.

## 2020-07-18 ENCOUNTER — Other Ambulatory Visit: Payer: Self-pay

## 2020-07-18 ENCOUNTER — Ambulatory Visit (INDEPENDENT_AMBULATORY_CARE_PROVIDER_SITE_OTHER): Payer: 59 | Admitting: General Practice

## 2020-07-18 DIAGNOSIS — Z7901 Long term (current) use of anticoagulants: Secondary | ICD-10-CM | POA: Diagnosis not present

## 2020-07-18 LAB — POCT INR: INR: 1.8 — AB (ref 2.0–3.0)

## 2020-07-18 NOTE — Patient Instructions (Incomplete)
Pre visit review using our clinic review tool, if applicable. No additional management support is needed unless otherwise documented below in the visit note.  Take 1 1/2 tablets today and then change dosage and take 1 tablet daily except take 1 1/2 tablet on Wednesdays.  Re-check in 1 week.

## 2020-07-20 ENCOUNTER — Telehealth: Payer: Self-pay | Admitting: Adult Health

## 2020-07-20 NOTE — Telephone Encounter (Signed)
Please advise 

## 2020-07-20 NOTE — Telephone Encounter (Signed)
Pt requested refill-states he will be traveling on Sept 1 so he needs these prior to that date  Medication: Warfarin Hydrocodone   Pharmacy: [id number] - [place], [place] Phone:  [phone]  Fax:  [phone]

## 2020-07-20 NOTE — Telephone Encounter (Signed)
Pt needs a prescription for warfarin (COUMADIN) 6 MG tablet   Please advise

## 2020-07-21 ENCOUNTER — Other Ambulatory Visit: Payer: Self-pay | Admitting: General Practice

## 2020-07-21 ENCOUNTER — Other Ambulatory Visit: Payer: Self-pay | Admitting: Adult Health

## 2020-07-21 DIAGNOSIS — Z7901 Long term (current) use of anticoagulants: Secondary | ICD-10-CM

## 2020-07-21 MED ORDER — WARFARIN SODIUM 6 MG PO TABS: ORAL_TABLET | ORAL | 3 refills | Status: DC

## 2020-07-21 MED ORDER — HYDROCODONE-ACETAMINOPHEN 10-325 MG PO TABS: 1.0000 | ORAL_TABLET | Freq: Three times a day (TID) | ORAL | 0 refills | Status: DC | PRN

## 2020-07-21 NOTE — Telephone Encounter (Signed)
I have sent in 10 additional tabs of Norco, this will be the last prescription for narcotic pain medication.  His Coumadin he will get filled when he is seen by Jenny Reichmann next week

## 2020-07-21 NOTE — Telephone Encounter (Signed)
Patient notified and verbalized understanding. 

## 2020-07-21 NOTE — Telephone Encounter (Signed)
Pt received a refill 06/26/2020 for 30 day supply. Pt has an upcoming appt. With Jenny Reichmann 07/25/2020 before he runs out. Tried to call pt to advise of update but no answer.  Will route to [facility] for advise.

## 2020-07-22 ENCOUNTER — Encounter: Payer: Self-pay | Admitting: Hematology and Oncology

## 2020-07-25 ENCOUNTER — Ambulatory Visit: Payer: 59

## 2020-07-26 ENCOUNTER — Encounter: Payer: Self-pay | Admitting: Vascular Surgery

## 2020-07-26 ENCOUNTER — Other Ambulatory Visit: Payer: Self-pay

## 2020-07-26 ENCOUNTER — Ambulatory Visit: Payer: Self-pay | Admitting: General Practice

## 2020-07-26 ENCOUNTER — Other Ambulatory Visit (HOSPITAL_COMMUNITY)
Admission: RE | Admit: 2020-07-26 | Discharge: 2020-07-26 | Disposition: A | Discharge status: Home / Self Care | Payer: 59 | Source: Ambulatory Visit | Attending: Vascular Surgery | Admitting: Vascular Surgery

## 2020-07-26 ENCOUNTER — Ambulatory Visit: Payer: 59 | Admitting: Vascular Surgery

## 2020-07-26 VITALS — BP 122/88 | HR 93 | Temp 97.8°F | Resp 16 | Ht 73.0 in | Wt 292.0 lb

## 2020-07-26 DIAGNOSIS — Z20822 Contact with and (suspected) exposure to covid-19: Secondary | ICD-10-CM | POA: Diagnosis not present

## 2020-07-26 DIAGNOSIS — I825Z2 Chronic embolism and thrombosis of unspecified deep veins of left distal lower extremity: Secondary | ICD-10-CM | POA: Diagnosis not present

## 2020-07-26 DIAGNOSIS — Z01812 Encounter for preprocedural laboratory examination: Secondary | ICD-10-CM | POA: Insufficient documentation

## 2020-07-26 LAB — SARS CORONAVIRUS 2 (TAT 6-24 HRS): SARS Coronavirus 2: NEGATIVE

## 2020-07-26 NOTE — H&P (View-Only) (Signed)
Patient name: Noah Moore MRN: [id number] DOB: 1991-11-19 Sex: male  REASON FOR VISIT: Continued F/U after left iliac vein/IVC lysis with percutaneous mechanical vein thrombectomy and venoplasty  HPI: Noah Moore is a 29 y.o. male that presents for continued follow-up after left iliac vein and IVC lysis with percutaneous mechanical thrombectomy and venoplasty on 06/23/20.  He has a very complex history.  As previously noted, he initially was diagnosed with a DVT in April 2021 in the left common femoral, superficial femoral and popliteal vein.  Ultimately was seen in the ED and discharged after seeing improvement on heparin and no intervention was initially performed.  On follow-up in the clinic he had worsening symptoms and had percutaneous mechanical thrombectomy of the left lower extremity with balloon angioplasty of the left femoral-popliteal vein on 03/30/2020.  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  There was no thrombus in the iliac or IVC.  No evidence of May Thurner.  This DVT was thought to be provoked from a long drive (>5 hours) after further evaluation with hem-onc.  He has unknown family history as he is adopted.  He was initially put on Xarelto but was transitioned to Eliquis due to bleeding gums.   He subsequently presented to the ER at the end of July where he had a CT of the abdomen and pelvis with contrast that showed extensive DVT of profunda femoral vein to the junction of the common iliac veins and likely into the inferior aspect of the IVC. He has was started on IV heparin.  Ultimately we attempted another intervention via popliteal access but could not get across a left common femoral vein occlusion with a large pelvic collateral.  Ultimately was able to access the common femoral vein with him supine and much more proximal near the iliac vein junction above the occlusion and ultimately performed lysis and then  percutaneous mechanical thrombectomy and venoplasty.  All of this then reoccluded within a week while being therapeutic on Coumadin after further input from hem onc.   He has been seen by Dr. Lorenso Courier again with heme-onc and placed on Lovenox in the interim.    I initially saw him several weeks ago and he saw significant improvement on the Lovenox even with the reocclusion of his iliac vein.  I got him back in thigh-high compression.  I wanted to see him back in short order given we discussed any further intervention would likely be time sensitive given the nature of chronic thrombus being much more difficult from a vascular standpoint.  Follow-up today does not feel he is doing as well.  States he is having pretty significant swelling at the end of the day even with compression and having a fair amount of pain in his leg that limits him from mobility.  Having hard time getting his shoe on at work.  Past Medical History:  Diagnosis Date   Allergy    DVT (deep venous thrombosis) ([facility])    DVT (deep venous thrombosis) ([place]) 02/2020    Past Surgical History:  Procedure Laterality Date   INTRAVASCULAR ULTRASOUND/IVUS N/A 03/30/2020   Procedure: INTRAVASCULAR ULTRASOUND/IVUS;  Surgeon: Marty Heck, MD;  Location: [facility];  Service: Cardiovascular;  Laterality: N/A;   INTRAVASCULAR ULTRASOUND/IVUS N/A 06/24/2020   Procedure: Intravascular Ultrasound/IVUS;  Surgeon: Marty Heck, MD;  Location: [facility];  Service: Cardiovascular;  Laterality: N/A;   IVC VENOGRAPHY N/A 03/30/2020  Procedure: IVC Venography;  Surgeon: Marty Heck, MD;  Location: [facility];  Service: Cardiovascular;  Laterality: N/A;   KNEE SURGERY Right    LEG SURGERY     Reports history of right lower extremity tibia fracture in 2010 which was repaired surgically.   LOWER EXTREMITY VENOGRAPHY Left 03/30/2020   Procedure: LOWER EXTREMITY VENOGRAPHY;  Surgeon: Marty Heck, MD;  Location: [facility];  Service: Cardiovascular;  Laterality: Left;   LOWER EXTREMITY VENOGRAPHY  06/23/2020   Procedure: LOWER EXTREMITY VENOGRAPHY;  Surgeon: Marty Heck, MD;  Location: [facility];  Service: Cardiovascular;;   PERIPHERAL VASCULAR THROMBECTOMY Left 03/30/2020   Procedure: PERIPHERAL VASCULAR THROMBECTOMY;  Surgeon: Marty Heck, MD;  Location: [facility];  Service: Cardiovascular;  Laterality: Left;   PERIPHERAL VASCULAR THROMBECTOMY N/A 06/24/2020   Procedure: PERIPHERAL VASCULAR THROMBECTOMY;  Surgeon: Marty Heck, MD;  Location: [facility];  Service: Cardiovascular;  Laterality: N/A;   THROMBECTOMY  03/2020    Family History  Adopted: Yes  Problem Relation Age of Onset   Venous thrombosis Neg Hx     SOCIAL HISTORY: Social History   Tobacco Use   Smoking status: Former Smoker    Types: E-cigarettes, Cigarettes   Smokeless tobacco: Never Used  Substance Use Topics   Alcohol use: Not Currently    No Known Allergies  Current Outpatient Medications  Medication Sig Dispense Refill   cetirizine (ZYRTEC) 10 MG tablet Take 1 tablet by mouth daily as needed for allergies.      cetirizine (ZYRTEC) 10 MG tablet Take 10 mg by mouth daily.     docusate sodium (COLACE) 100 MG capsule Take 100 mg by mouth daily as needed for mild constipation.     enoxaparin (LOVENOX) 150 MG/ML injection Inject 0.86 mLs (129 mg total) into the skin every 12 (twelve) hours. 28 mL 1   enoxaparin (LOVENOX) 150 MG/ML injection Inject 0.86 mLs (130 mg total) into the skin 2 (two) times daily for 6 doses. (Patient not taking: Reported on 07/06/2020) 5.16 mL 0   HYDROcodone-acetaminophen (NORCO) 10-325 MG tablet Take 1 tablet by mouth every 8 (eight) hours as needed for up to 5 days. (Patient not taking: Reported on 07/26/2020) 10 tablet 0   warfarin (COUMADIN) 6 MG tablet Take 1 tablet daily or Take as directed  by anticoagulation clinic (Patient not taking: Reported on 07/26/2020) 40 tablet 3   No current facility-administered medications for this visit.    REVIEW OF SYSTEMS:  [X]  denotes positive finding, [ ]  denotes negative finding Cardiac  Comments:  Chest pain or chest pressure:    Shortness of breath upon exertion:    Short of breath when lying flat:    Irregular heart rhythm:        Vascular    Pain in calf, thigh, or hip brought on by ambulation:    Pain in feet at night that wakes you up from your sleep:     Blood clot in your veins:    Leg swelling:  x Left       Pulmonary    Oxygen at home:    Productive cough:     Wheezing:         Neurologic    Sudden weakness in arms or legs:     Sudden numbness in arms or legs:     Sudden onset of difficulty speaking or slurred speech:    Temporary loss  of vision in one eye:     Problems with dizziness:         Gastrointestinal    Blood in stool:     Vomited blood:         Genitourinary    Burning when urinating:     Blood in urine:        Psychiatric    Major depression:         Hematologic    Bleeding problems:    Problems with blood clotting too easily:        Skin    Rashes or ulcers:        Constitutional    Fever or chills:      PHYSICAL EXAM: Vitals:   07/26/20 1102  BP: 122/88  Pulse: 93  Resp: 16  Temp: 97.8 F (36.6 C)  TempSrc: Temporal  SpO2: 96%  Weight: 292 lb (132.5 kg)  Height: 6\' 1"  (1.854 m)    GENERAL: The patient is a well-nourished male, in no acute distress. The vital signs are documented above. CARDIAC: There is a regular rate and rhythm.  VASCULAR:  Persistent left leg edema as pictured below  Palpable left dorsalis pedis and posterior tibial pulses with no signs of phlegmasia and soft compartments        DATA:   No new imaging today. IVC/ILIAC STUDY   Indications: History of DVT with left lower extremity swelling, continuous  flow        in left CFV on lower  extremity venous duplex.   Other Factors: Stopped Lovenox 07/02/2020, left lower extremity swelling  began         07/02/2020. Currently on Coumadin, INR=2.2.     Comparison Study: 06/23/2020- acute thrombus left common iliac and external  iliac          veins.   Performing Technologist: Maudry Mayhew MHA, RDMS, RVT, RDCS     Examination Guidelines: A complete evaluation includes B-mode imaging,  spectral  Doppler, color Doppler, and power Doppler as needed of all accessible  portions  of each vessel. Bilateral testing is considered an integral part of a  complete  examination. Limited examinations for reoccurring indications may be  performed  as noted.     IVC/Iliac Findings:  +----------+------+--------+--------+     IVC   Patent Thrombus Comments   +----------+------+--------+--------+   IVC Prox  patent             +----------+------+--------+--------+   IVC Mid   patent             +----------+------+--------+--------+   IVC Distal      acute         +----------+------+--------+--------+     +------------+---------+-----------+---------+-----------+--------+     CIV    RT-Patent RT-Thrombus LT-Patent LT-Thrombus Comments   +------------+---------+-----------+---------+-----------+--------+   Common Iliac  patent                 acute          +------------+---------+-----------+---------+-----------+--------+      +-------------------+---------+-----------+---------+-----------+--------+       EIV     RT-Patent RT-Thrombus LT-Patent LT-Thrombus Comments   +-------------------+---------+-----------+---------+-----------+--------+   External Iliac Vein  patent                 acute          +-------------------+---------+-----------+---------+-----------+--------+      Summary:  IVC/Iliac:  There is evidence of acute thrombus involving the distal IVC.  There is  evidence  of acute thrombus involving the left common iliac vein.   There is evidence of acute thrombus involving the left external iliac  vein.   There is no evidence of thrombus involving the right common iliac vein.  There is no evidence of thrombus involving the right external iliac vein.   Assessment/Plan:  29 year old male presents for continued follow-up after recent thrombolysis and percutaneous mechanical thrombectomy of the left iliac vein and distal IVC on 7/29-7/30/21.  Unfortunately he has since reoccluded this as demonstrated on duplex even while being therapeutic on coumadin.  When we first performed intervention back in May, he had mostly chronic thrombus in the left popliteal superficial femoral and common femoral vein.  At that time the iliac and IVC was patent and there was no evidence of May Thurner.  When I reintervened last month due to new iliac IVC involvement I could not get across his left common femoral vein from popliteal approach and this appears chronically occluded now with large collaterals into the pelvis.  I did access his left common femoral vein in the supine position above the occluded segment and got a nice flow channel with thrombolysis and percutaneous mechanical thrombectomy.  I had previously discussed with him today that I suspect this reoccluded given that he has no inflow with a common femoral vein occlusion on the left.  Without good inflow would be difficult to keep the left iliac system patent.    I discussed with him again today that if he has seen significant improvement on Lovenox I would likely favor no further intervention given poor durability of previous interventions as well as the significant amount of more chronic appearing thrombus that we have found on previous interventions.  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  He does not feel his symptoms are tolerable but would like to further  think about options with his family.  We will tentatively post him for Thursday in the [facility] OR.  I reviewed his images with my partner Dr. Donzetta Matters and we have discussed coming from both an IJ and left leg approach after getting updated venogram to see if we can get across his left common femoral occlusion and potentially stent his iliac into the common femoral vein.  I again discussed that I would not favor this unless he truly feels he really cannot tolerate his symptoms given the risk of losing additional collaterals and making him worse and the poor durability of previous attempts.  At the very least if no additional intervention ares performed potentially would get updated venogram images for any other options down the road.  This will have to be in the OR under general anesthesia.  I continue to appreciate Dr. Deloria Lair input for any other etiology that we may be missing.  Marty Heck, MD [facility] Office: [phone]

## 2020-07-26 NOTE — Progress Notes (Signed)
Patient name: Noah Moore MRN: [id number] DOB: 1991/01/19 Sex: male  REASON FOR VISIT: Continued F/U after left iliac vein/IVC lysis with percutaneous mechanical vein thrombectomy and venoplasty  HPI: Noah Moore is a 29 y.o. male that presents for continued follow-up after left iliac vein and IVC lysis with percutaneous mechanical thrombectomy and venoplasty on 06/23/20.  He has a very complex history.  As previously noted, he initially was diagnosed with a DVT in April 2021 in the left common femoral, superficial femoral and popliteal vein.  Ultimately was seen in the ED and discharged after seeing improvement on heparin and no intervention was initially performed.  On follow-up in the clinic he had worsening symptoms and had percutaneous mechanical thrombectomy of the left lower extremity with balloon angioplasty of the left femoral-popliteal vein on 03/30/2020.  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  There was no thrombus in the iliac or IVC.  No evidence of May Thurner.  This DVT was thought to be provoked from a long drive (>5 hours) after further evaluation with hem-onc.  He has unknown family history as he is adopted.  He was initially put on Xarelto but was transitioned to Eliquis due to bleeding gums.   He subsequently presented to the ER at the end of July where he had a CT of the abdomen and pelvis with contrast that showed extensive DVT of profunda femoral vein to the junction of the common iliac veins and likely into the inferior aspect of the IVC. He has was started on IV heparin.  Ultimately we attempted another intervention via popliteal access but could not get across a left common femoral vein occlusion with a large pelvic collateral.  Ultimately was able to access the common femoral vein with him supine and much more proximal near the iliac vein junction above the occlusion and ultimately performed lysis and then  percutaneous mechanical thrombectomy and venoplasty.  All of this then reoccluded within a week while being therapeutic on Coumadin after further input from hem onc.   He has been seen by Dr. Lorenso Courier again with heme-onc and placed on Lovenox in the interim.    I initially saw him several weeks ago and he saw significant improvement on the Lovenox even with the reocclusion of his iliac vein.  I got him back in thigh-high compression.  I wanted to see him back in short order given we discussed any further intervention would likely be time sensitive given the nature of chronic thrombus being much more difficult from a vascular standpoint.  Follow-up today does not feel he is doing as well.  States he is having pretty significant swelling at the end of the day even with compression and having a fair amount of pain in his leg that limits him from mobility.  Having hard time getting his shoe on at work.  Past Medical History:  Diagnosis Date  . Allergy   . DVT (deep venous thrombosis) ([place])   . DVT (deep venous thrombosis) ([place]) 02/2020    Past Surgical History:  Procedure Laterality Date  . INTRAVASCULAR ULTRASOUND/IVUS N/A 03/30/2020   Procedure: INTRAVASCULAR ULTRASOUND/IVUS;  Surgeon: Marty Heck, MD;  Location: [facility];  Service: Cardiovascular;  Laterality: N/A;  . INTRAVASCULAR ULTRASOUND/IVUS N/A 06/24/2020   Procedure: Intravascular Ultrasound/IVUS;  Surgeon: Marty Heck, MD;  Location: [facility];  Service: Cardiovascular;  Laterality: N/A;  . IVC VENOGRAPHY N/A 03/30/2020  Procedure: IVC Venography;  Surgeon: Marty Heck, MD;  Location: [facility];  Service: Cardiovascular;  Laterality: N/A;  . KNEE SURGERY Right   . LEG SURGERY     Reports history of right lower extremity tibia fracture in 2010 which was repaired surgically.  . LOWER EXTREMITY VENOGRAPHY Left 03/30/2020   Procedure: LOWER EXTREMITY VENOGRAPHY;  Surgeon: Marty Heck, MD;  Location: [facility];  Service: Cardiovascular;  Laterality: Left;  . LOWER EXTREMITY VENOGRAPHY  06/23/2020   Procedure: LOWER EXTREMITY VENOGRAPHY;  Surgeon: Marty Heck, MD;  Location: [facility];  Service: Cardiovascular;;  . PERIPHERAL VASCULAR THROMBECTOMY Left 03/30/2020   Procedure: PERIPHERAL VASCULAR THROMBECTOMY;  Surgeon: Marty Heck, MD;  Location: [facility];  Service: Cardiovascular;  Laterality: Left;  . PERIPHERAL VASCULAR THROMBECTOMY N/A 06/24/2020   Procedure: PERIPHERAL VASCULAR THROMBECTOMY;  Surgeon: Marty Heck, MD;  Location: [facility];  Service: Cardiovascular;  Laterality: N/A;  . THROMBECTOMY  03/2020    Family History  Adopted: Yes  Problem Relation Age of Onset  . Venous thrombosis Neg Hx     SOCIAL HISTORY: Social History   Tobacco Use  . Smoking status: Former Smoker    Types: E-cigarettes, Cigarettes  . Smokeless tobacco: Never Used  Substance Use Topics  . Alcohol use: Not Currently    No Known Allergies  Current Outpatient Medications  Medication Sig Dispense Refill  . cetirizine (ZYRTEC) 10 MG tablet Take 1 tablet by mouth daily as needed for allergies.     . cetirizine (ZYRTEC) 10 MG tablet Take 10 mg by mouth daily.    Marland Kitchen docusate sodium (COLACE) 100 MG capsule Take 100 mg by mouth daily as needed for mild constipation.    . enoxaparin (LOVENOX) 150 MG/ML injection Inject 0.86 mLs (129 mg total) into the skin every 12 (twelve) hours. 28 mL 1  . enoxaparin (LOVENOX) 150 MG/ML injection Inject 0.86 mLs (130 mg total) into the skin 2 (two) times daily for 6 doses. (Patient not taking: Reported on 07/06/2020) 5.16 mL 0  . HYDROcodone-acetaminophen (NORCO) 10-325 MG tablet Take 1 tablet by mouth every 8 (eight) hours as needed for up to 5 days. (Patient not taking: Reported on 07/26/2020) 10 tablet 0  . warfarin (COUMADIN) 6 MG tablet Take 1 tablet daily or Take as directed  by anticoagulation clinic (Patient not taking: Reported on 07/26/2020) 40 tablet 3   No current facility-administered medications for this visit.    REVIEW OF SYSTEMS:  [X]  denotes positive finding, [ ]  denotes negative finding Cardiac  Comments:  Chest pain or chest pressure:    Shortness of breath upon exertion:    Short of breath when lying flat:    Irregular heart rhythm:        Vascular    Pain in calf, thigh, or hip brought on by ambulation:    Pain in feet at night that wakes you up from your sleep:     Blood clot in your veins:    Leg swelling:  x Left       Pulmonary    Oxygen at home:    Productive cough:     Wheezing:         Neurologic    Sudden weakness in arms or legs:     Sudden numbness in arms or legs:     Sudden onset of difficulty speaking or slurred speech:    Temporary loss  of vision in one eye:     Problems with dizziness:         Gastrointestinal    Blood in stool:     Vomited blood:         Genitourinary    Burning when urinating:     Blood in urine:        Psychiatric    Major depression:         Hematologic    Bleeding problems:    Problems with blood clotting too easily:        Skin    Rashes or ulcers:        Constitutional    Fever or chills:      PHYSICAL EXAM: Vitals:   07/26/20 1102  BP: 122/88  Pulse: 93  Resp: 16  Temp: 97.8 F (36.6 C)  TempSrc: Temporal  SpO2: 96%  Weight: 292 lb (132.5 kg)  Height: 6\' 1"  (1.854 m)    GENERAL: The patient is a well-nourished male, in no acute distress. The vital signs are documented above. CARDIAC: There is a regular rate and rhythm.  VASCULAR:  Persistent left leg edema as pictured below  Palpable left dorsalis pedis and posterior tibial pulses with no signs of phlegmasia and soft compartments        DATA:   No new imaging today. IVC/ILIAC STUDY   Indications: History of DVT with left lower extremity swelling, continuous  flow        in left CFV on lower  extremity venous duplex.   Other Factors: Stopped Lovenox 07/02/2020, left lower extremity swelling  began         07/02/2020. Currently on Coumadin, INR=2.2.     Comparison Study: 06/23/2020- acute thrombus left common iliac and external  iliac          veins.   Performing Technologist: Maudry Mayhew MHA, RDMS, RVT, RDCS     Examination Guidelines: A complete evaluation includes B-mode imaging,  spectral  Doppler, color Doppler, and power Doppler as needed of all accessible  portions  of each vessel. Bilateral testing is considered an integral part of a  complete  examination. Limited examinations for reoccurring indications may be  performed  as noted.     IVC/Iliac Findings:  +----------+------+--------+--------+    IVC  PatentThrombusComments  +----------+------+--------+--------+  IVC Prox patent          +----------+------+--------+--------+  IVC Mid  patent          +----------+------+--------+--------+  IVC Distal    acute       +----------+------+--------+--------+     +------------+---------+-----------+---------+-----------+--------+    CIV   RT-PatentRT-ThrombusLT-PatentLT-ThrombusComments  +------------+---------+-----------+---------+-----------+--------+  Common Iliac patent              acute        +------------+---------+-----------+---------+-----------+--------+      +-------------------+---------+-----------+---------+-----------+--------+      EIV    RT-PatentRT-ThrombusLT-PatentLT-ThrombusComments  +-------------------+---------+-----------+---------+-----------+--------+  External Iliac Vein patent              acute        +-------------------+---------+-----------+---------+-----------+--------+      Summary:  IVC/Iliac:  There is evidence of acute thrombus involving the distal IVC.  There is  evidence of acute thrombus involving the left common iliac vein.   There is evidence of acute thrombus involving the left external iliac  vein.   There is no evidence of thrombus involving the right common iliac vein.  There is no evidence of thrombus involving the right  external iliac vein.   Assessment/Plan:  30 year old male presents for continued follow-up after recent thrombolysis and percutaneous mechanical thrombectomy of the left iliac vein and distal IVC on 7/29-7/30/21.  Unfortunately he has since reoccluded this as demonstrated on duplex even while being therapeutic on coumadin.  When we first performed intervention back in May, he had mostly chronic thrombus in the left popliteal superficial femoral and common femoral vein.  At that time the iliac and IVC was patent and there was no evidence of May Thurner.  When I reintervened last month due to new iliac IVC involvement I could not get across his left common femoral vein from popliteal approach and this appears chronically occluded now with large collaterals into the pelvis.  I did access his left common femoral vein in the supine position above the occluded segment and got a nice flow channel with thrombolysis and percutaneous mechanical thrombectomy.  I had previously discussed with him today that I suspect this reoccluded given that he has no inflow with a common femoral vein occlusion on the left.  Without good inflow would be difficult to keep the left iliac system patent.    I discussed with him again today that if he has seen significant improvement on Lovenox I would likely favor no further intervention given poor durability of previous interventions as well as the significant amount of more chronic appearing thrombus that we have found on previous interventions.  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  He does not feel his symptoms are tolerable but would like to further  think about options with his family.  We will tentatively post him for Thursday in the hybrid OR.  I reviewed his images with my partner Dr. Donzetta Matters and we have discussed coming from both an IJ and left leg approach after getting updated venogram to see if we can get across his left common femoral occlusion and potentially stent his iliac into the common femoral vein.  I again discussed that I would not favor this unless he truly feels he really cannot tolerate his symptoms given the risk of losing additional collaterals and making him worse and the poor durability of previous attempts.  At the very least if no additional intervention ares performed potentially would get updated venogram images for any other options down the road.  This will have to be in the OR under general anesthesia.  I continue to appreciate Dr. Deloria Lair input for any other etiology that we may be missing.  Marty Heck, MD [facility] Office: [phone]

## 2020-07-27 ENCOUNTER — Other Ambulatory Visit: Payer: Self-pay

## 2020-07-27 ENCOUNTER — Encounter (HOSPITAL_COMMUNITY): Payer: Self-pay | Admitting: Vascular Surgery

## 2020-07-27 MED ORDER — DEXTROSE 5 % IV SOLN
3.0000 g | INTRAVENOUS | Status: AC
  Administered 2020-07-28: 3 g via INTRAVENOUS
  Filled 2020-07-27: qty 3

## 2020-07-27 NOTE — Progress Notes (Signed)
Pt denies SOB, chest pain, and being under the care of a cardiologist.Pt stated that Dorothyann Peng, NP is his PCP. Pt denies having a stress test and echo.  Pt stated that he was instructed to take Lovenox on DOS. Pt made aware to stop taking  vitamins, fish oil and herbal medications. Do not take any NSAIDs ie: Ibuprofen, Advil, Naproxen (Aleve), Motrin, BC and Goody Powder. Pt reminded to quarantine. Pt verbalized understanding of all pre-op instructions. PA, Anesthesiology, asked to review pt history.

## 2020-07-28 ENCOUNTER — Other Ambulatory Visit: Payer: Self-pay

## 2020-07-28 ENCOUNTER — Encounter (HOSPITAL_COMMUNITY)
Admission: RE | Disposition: A | Discharge status: Home / Self Care | Payer: Self-pay | Source: Home / Self Care | Attending: Vascular Surgery

## 2020-07-28 ENCOUNTER — Encounter (HOSPITAL_COMMUNITY): Payer: Self-pay | Admitting: Vascular Surgery

## 2020-07-28 ENCOUNTER — Ambulatory Visit (HOSPITAL_COMMUNITY): Payer: No Typology Code available for payment source

## 2020-07-28 ENCOUNTER — Ambulatory Visit (HOSPITAL_COMMUNITY): Payer: No Typology Code available for payment source | Admitting: Physician Assistant

## 2020-07-28 ENCOUNTER — Ambulatory Visit (HOSPITAL_COMMUNITY)
Admission: RE | Admit: 2020-07-28 | Discharge: 2020-07-28 | Disposition: A | Discharge status: Home / Self Care | Payer: No Typology Code available for payment source | Attending: Vascular Surgery | Admitting: Vascular Surgery

## 2020-07-28 DIAGNOSIS — I82402 Acute embolism and thrombosis of unspecified deep veins of left lower extremity: Secondary | ICD-10-CM | POA: Insufficient documentation

## 2020-07-28 DIAGNOSIS — I82492 Acute embolism and thrombosis of other specified deep vein of left lower extremity: Secondary | ICD-10-CM

## 2020-07-28 DIAGNOSIS — I82422 Acute embolism and thrombosis of left iliac vein: Secondary | ICD-10-CM

## 2020-07-28 DIAGNOSIS — Z7901 Long term (current) use of anticoagulants: Secondary | ICD-10-CM | POA: Diagnosis not present

## 2020-07-28 DIAGNOSIS — Z87891 Personal history of nicotine dependence: Secondary | ICD-10-CM | POA: Insufficient documentation

## 2020-07-28 HISTORY — PX: VENOGRAM: SHX5497

## 2020-07-28 HISTORY — PX: ULTRASOUND GUIDANCE FOR VASCULAR ACCESS: SHX6516

## 2020-07-28 HISTORY — DX: Gastro-esophageal reflux disease without esophagitis: K21.9

## 2020-07-28 LAB — CBC
HCT: 40.7 % (ref 39.0–52.0)
Hemoglobin: 12.7 g/dL — ABNORMAL LOW (ref 13.0–17.0)
MCH: 27.1 pg (ref 26.0–34.0)
MCHC: 31.2 g/dL (ref 30.0–36.0)
MCV: 87 fL (ref 80.0–100.0)
Platelets: 329 10*3/uL (ref 150–400)
RBC: 4.68 MIL/uL (ref 4.22–5.81)
RDW: 14.2 % (ref 11.5–15.5)
WBC: 8 10*3/uL (ref 4.0–10.5)
nRBC: 0 % (ref 0.0–0.2)

## 2020-07-28 LAB — TYPE AND SCREEN
ABO/RH(D): A POS
Antibody Screen: NEGATIVE

## 2020-07-28 LAB — ABO/RH: ABO/RH(D): A POS

## 2020-07-28 LAB — BASIC METABOLIC PANEL
Anion gap: 11 (ref 5–15)
BUN: <5 mg/dL — ABNORMAL LOW (ref 6–20)
CO2: 24 mmol/L (ref 22–32)
Calcium: 9.3 mg/dL (ref 8.9–10.3)
Chloride: 104 mmol/L (ref 98–111)
Creatinine, Ser: 0.8 mg/dL (ref 0.61–1.24)
GFR calc Af Amer: >60 mL/min (ref >60)
GFR calc non Af Amer: >60 mL/min (ref >60)
Glucose, Bld: 90 mg/dL (ref 70–99)
Potassium: 3.7 mmol/L (ref 3.5–5.1)
Sodium: 139 mmol/L (ref 135–145)

## 2020-07-28 LAB — PROTIME-INR
INR: 1.1 (ref 0.8–1.2)
Prothrombin Time: 13.6 seconds (ref 11.4–15.2)

## 2020-07-28 SURGERY — VENOGRAM
Anesthesia: General

## 2020-07-28 MED ORDER — LACTATED RINGERS IV SOLN: INTRAVENOUS | Status: DC

## 2020-07-28 MED ORDER — CHLORHEXIDINE GLUCONATE 0.12 % MT SOLN
OROMUCOSAL | Status: AC
  Administered 2020-07-28: 15 mL via OROMUCOSAL
  Filled 2020-07-28: qty 15

## 2020-07-28 MED ORDER — ACETAMINOPHEN 160 MG/5ML PO SOLN: 1000.0000 mg | Freq: Once | ORAL | Status: DC | PRN

## 2020-07-28 MED ORDER — 0.9 % SODIUM CHLORIDE (POUR BTL) OPTIME
TOPICAL | Status: DC | PRN
  Administered 2020-07-28: 1000 mL

## 2020-07-28 MED ORDER — FENTANYL CITRATE (PF) 100 MCG/2ML IJ SOLN
25.0000 ug | INTRAMUSCULAR | Status: DC | PRN
  Administered 2020-07-28: 25 ug via INTRAVENOUS
  Administered 2020-07-28: 50 ug via INTRAVENOUS

## 2020-07-28 MED ORDER — SODIUM CHLORIDE 0.9 % IV SOLN: INTRAVENOUS | Status: DC

## 2020-07-28 MED ORDER — ROCURONIUM BROMIDE 10 MG/ML (PF) SYRINGE
PREFILLED_SYRINGE | INTRAVENOUS | Status: AC
  Filled 2020-07-28: qty 10

## 2020-07-28 MED ORDER — OXYCODONE HCL 5 MG/5ML PO SOLN: 5.0000 mg | Freq: Once | ORAL | Status: AC | PRN

## 2020-07-28 MED ORDER — CHLORHEXIDINE GLUCONATE 0.12 % MT SOLN: 15.0000 mL | Freq: Once | OROMUCOSAL | Status: AC

## 2020-07-28 MED ORDER — FENTANYL CITRATE (PF) 250 MCG/5ML IJ SOLN
INTRAMUSCULAR | Status: DC | PRN
  Administered 2020-07-28: 150 ug via INTRAVENOUS
  Administered 2020-07-28 (×2): 50 ug via INTRAVENOUS

## 2020-07-28 MED ORDER — SODIUM CHLORIDE 0.9 % IV SOLN
INTRAVENOUS | Status: AC
  Filled 2020-07-28: qty 1.2

## 2020-07-28 MED ORDER — PROPOFOL 10 MG/ML IV BOLUS
INTRAVENOUS | Status: DC | PRN
  Administered 2020-07-28: 150 mg via INTRAVENOUS
  Administered 2020-07-28: 200 mg via INTRAVENOUS

## 2020-07-28 MED ORDER — FENTANYL CITRATE (PF) 250 MCG/5ML IJ SOLN
INTRAMUSCULAR | Status: AC
  Filled 2020-07-28: qty 5

## 2020-07-28 MED ORDER — HYDROCODONE-ACETAMINOPHEN 5-325 MG PO TABS
1.0000 | ORAL_TABLET | Freq: Four times a day (QID) | ORAL | 0 refills | Status: DC | PRN
Start: 2020-07-28 — End: 2020-10-17

## 2020-07-28 MED ORDER — MIDAZOLAM HCL 5 MG/5ML IJ SOLN
INTRAMUSCULAR | Status: DC | PRN
  Administered 2020-07-28 (×2): 2 mg via INTRAVENOUS

## 2020-07-28 MED ORDER — CHLORHEXIDINE GLUCONATE 4 % EX LIQD: 60.0000 mL | Freq: Once | CUTANEOUS | Status: DC

## 2020-07-28 MED ORDER — LIDOCAINE HCL (CARDIAC) PF 100 MG/5ML IV SOSY
PREFILLED_SYRINGE | INTRAVENOUS | Status: DC | PRN
  Administered 2020-07-28: 60 mg via INTRATRACHEAL

## 2020-07-28 MED ORDER — ROCURONIUM BROMIDE 100 MG/10ML IV SOLN
INTRAVENOUS | Status: DC | PRN
  Administered 2020-07-28: 20 mg via INTRAVENOUS
  Administered 2020-07-28: 80 mg via INTRAVENOUS

## 2020-07-28 MED ORDER — ONDANSETRON HCL 4 MG/2ML IJ SOLN
INTRAMUSCULAR | Status: AC
  Filled 2020-07-28: qty 2

## 2020-07-28 MED ORDER — MIDAZOLAM HCL 2 MG/2ML IJ SOLN
INTRAMUSCULAR | Status: AC
  Filled 2020-07-28: qty 2

## 2020-07-28 MED ORDER — ACETAMINOPHEN 10 MG/ML IV SOLN
INTRAVENOUS | Status: AC
  Filled 2020-07-28: qty 100

## 2020-07-28 MED ORDER — IODIXANOL 320 MG/ML IV SOLN
INTRAVENOUS | Status: DC | PRN
  Administered 2020-07-28 (×2): 100 mL via INTRAVENOUS

## 2020-07-28 MED ORDER — OXYCODONE HCL 5 MG PO TABS
ORAL_TABLET | ORAL | Status: AC
  Filled 2020-07-28: qty 1

## 2020-07-28 MED ORDER — PHENYLEPHRINE 40 MCG/ML (10ML) SYRINGE FOR IV PUSH (FOR BLOOD PRESSURE SUPPORT)
PREFILLED_SYRINGE | INTRAVENOUS | Status: AC
  Filled 2020-07-28: qty 20

## 2020-07-28 MED ORDER — OXYCODONE HCL 5 MG PO TABS
5.0000 mg | ORAL_TABLET | Freq: Once | ORAL | Status: AC | PRN
  Administered 2020-07-28: 5 mg via ORAL

## 2020-07-28 MED ORDER — SUGAMMADEX SODIUM 200 MG/2ML IV SOLN
INTRAVENOUS | Status: DC | PRN
  Administered 2020-07-28: 1400 mg via INTRAVENOUS
  Administered 2020-07-28: 300 mg via INTRAVENOUS

## 2020-07-28 MED ORDER — ACETAMINOPHEN 10 MG/ML IV SOLN
1000.0000 mg | Freq: Once | INTRAVENOUS | Status: DC | PRN
  Administered 2020-07-28: 1000 mg via INTRAVENOUS

## 2020-07-28 MED ORDER — FENTANYL CITRATE (PF) 100 MCG/2ML IJ SOLN
INTRAMUSCULAR | Status: AC
  Filled 2020-07-28: qty 2

## 2020-07-28 MED ORDER — LACTATED RINGERS IV SOLN: INTRAVENOUS | Status: DC | PRN

## 2020-07-28 MED ORDER — SODIUM CHLORIDE 0.9 % IV SOLN
INTRAVENOUS | Status: DC | PRN
  Administered 2020-07-28 (×2): 500 mL

## 2020-07-28 MED ORDER — ACETAMINOPHEN 500 MG PO TABS: 1000.0000 mg | ORAL_TABLET | Freq: Once | ORAL | Status: DC | PRN

## 2020-07-28 SURGICAL SUPPLY — 85 items
BAG SNAP BAND KOVER 36X36 (MISCELLANEOUS) ×3 IMPLANT
BLADE SURG 11 STRL SS (BLADE) ×3 IMPLANT
BNDG ELASTIC 4X5.8 VLCR STR LF (GAUZE/BANDAGES/DRESSINGS) IMPLANT
CANISTER SUCT 3000ML PPV (MISCELLANEOUS) ×3 IMPLANT
CATH ANGIO 5F BER2 100CM (CATHETERS) ×6 IMPLANT
CATH ANGIO 5F BER2 65CM (CATHETERS) ×3 IMPLANT
CATH BEACON 5 .035 65 KMP TIP (CATHETERS) ×3 IMPLANT
CATH LAUNCHER 5F MPST (CATHETERS) ×2 IMPLANT
CATH NAVICROSS ST .035X135CM (MICROCATHETER) ×3 IMPLANT
CATH OMNI FLUSH .035X70CM (CATHETERS) IMPLANT
CATH QUICKCROSS .035X135CM (MICROCATHETER) ×3 IMPLANT
CATH QUICKCROSS SUPP .035X90CM (MICROCATHETER) ×3 IMPLANT
CATHETER LAUNCHER 5F MPST (CATHETERS) ×3
CHLORAPREP W/TINT 26 (MISCELLANEOUS) IMPLANT
CLIP VESOCCLUDE MED 6/CT (CLIP) IMPLANT
CLIP VESOCCLUDE SM WIDE 6/CT (CLIP) IMPLANT
COVER DOME SNAP 22 D (MISCELLANEOUS) ×3 IMPLANT
COVER PROBE W GEL 5X96 (DRAPES) ×3 IMPLANT
COVER SURGICAL LIGHT HANDLE (MISCELLANEOUS) ×3 IMPLANT
COVER TABLE BACK 60X90 (DRAPES) ×3 IMPLANT
COVER WAND RF STERILE (DRAPES) ×3 IMPLANT
DERMABOND ADVANCED (GAUZE/BANDAGES/DRESSINGS) ×2
DERMABOND ADVANCED .7 DNX12 (GAUZE/BANDAGES/DRESSINGS) ×4 IMPLANT
DEVICE TORQUE KENDALL .025-038 (MISCELLANEOUS) ×9 IMPLANT
DRAIN CHANNEL 15F RND FF W/TCR (WOUND CARE) IMPLANT
DRAPE C-ARM 42X72 X-RAY (DRAPES) IMPLANT
DRAPE X-RAY CASS 24X20 (DRAPES) IMPLANT
ELECT REM PT RETURN 9FT ADLT (ELECTROSURGICAL) ×3
ELECTRODE REM PT RTRN 9FT ADLT (ELECTROSURGICAL) ×2 IMPLANT
EVACUATOR SILICONE 100CC (DRAIN) IMPLANT
GAUZE 4X4 16PLY RFD (DISPOSABLE) IMPLANT
GAUZE SPONGE 4X4 12PLY STRL (GAUZE/BANDAGES/DRESSINGS) ×3 IMPLANT
GLIDEWIRE ADV .035X260CM (WIRE) ×3 IMPLANT
GLOVE BIO SURGEON STRL SZ7.5 (GLOVE) ×6 IMPLANT
GLOVE BIOGEL PI IND STRL 8 (GLOVE) ×2 IMPLANT
GLOVE BIOGEL PI INDICATOR 8 (GLOVE) ×1
GOWN STRL REUS W/ TWL LRG LVL3 (GOWN DISPOSABLE) ×4 IMPLANT
GOWN STRL REUS W/ TWL XL LVL3 (GOWN DISPOSABLE) ×6 IMPLANT
GOWN STRL REUS W/TWL LRG LVL3 (GOWN DISPOSABLE) ×2
GOWN STRL REUS W/TWL XL LVL3 (GOWN DISPOSABLE) ×3
GUIDEWIRE ANGLED .035X150CM (WIRE) IMPLANT
GUIDEWIRE ANGLED .035X260CM (WIRE) ×6 IMPLANT
HEMOSTAT SNOW SURGICEL 2X4 (HEMOSTASIS) IMPLANT
KIT BASIN OR (CUSTOM PROCEDURE TRAY) ×3 IMPLANT
KIT ENCORE 26 ADVANTAGE (KITS) IMPLANT
KIT TURNOVER KIT B (KITS) ×3 IMPLANT
NEEDLE PERC 18GX7CM (NEEDLE) ×3 IMPLANT
NS IRRIG 1000ML POUR BTL (IV SOLUTION) ×6 IMPLANT
PACK CV ACCESS (CUSTOM PROCEDURE TRAY) IMPLANT
PACK ENDO MINOR (CUSTOM PROCEDURE TRAY) ×3 IMPLANT
PACK GENERAL/GYN (CUSTOM PROCEDURE TRAY) IMPLANT
PACK PERIPHERAL VASCULAR (CUSTOM PROCEDURE TRAY) IMPLANT
PAD ARMBOARD 7.5X6 YLW CONV (MISCELLANEOUS) ×6 IMPLANT
PADDING CAST ABS 6INX4YD NS (CAST SUPPLIES)
PADDING CAST ABS COTTON 6X4 NS (CAST SUPPLIES) IMPLANT
SET COLLECT BLD 21X3/4 12 (NEEDLE) IMPLANT
SET MICROPUNCTURE 5F STIFF (MISCELLANEOUS) ×9 IMPLANT
SHEATH AVANTI 11CM 5FR (SHEATH) ×3 IMPLANT
SHEATH PINNACLE 5F 10CM (SHEATH) ×6 IMPLANT
SLEEVE ISOL F/PACE RF HD COVER (MISCELLANEOUS) ×3 IMPLANT
SPONGE LAP 18X18 RF (DISPOSABLE) ×3 IMPLANT
STOPCOCK 4 WAY LG BORE MALE ST (IV SETS) IMPLANT
STOPCOCK MORSE 400PSI 3WAY (MISCELLANEOUS) ×3 IMPLANT
SUT ETHILON 3 0 PS 1 (SUTURE) IMPLANT
SUT MNCRL AB 4-0 PS2 18 (SUTURE) IMPLANT
SUT PROLENE 5 0 C 1 24 (SUTURE) IMPLANT
SUT PROLENE 6 0 BV (SUTURE) IMPLANT
SUT PROLENE 7 0 BV 1 (SUTURE) IMPLANT
SUT SILK 2 0 SH (SUTURE) IMPLANT
SUT VIC AB 2-0 CT1 27 (SUTURE)
SUT VIC AB 2-0 CT1 TAPERPNT 27 (SUTURE) IMPLANT
SUT VIC AB 3-0 SH 27 (SUTURE)
SUT VIC AB 3-0 SH 27X BRD (SUTURE) IMPLANT
SYR 10ML LL (SYRINGE) ×6 IMPLANT
SYR 20ML LL LF (SYRINGE) ×3 IMPLANT
SYR 30ML LL (SYRINGE) ×3 IMPLANT
SYR MEDRAD MARK V 150ML (SYRINGE) IMPLANT
TOWEL GREEN STERILE (TOWEL DISPOSABLE) ×3 IMPLANT
TRAY FOLEY MTR SLVR 16FR STAT (SET/KITS/TRAYS/PACK) IMPLANT
TUBING EXTENTION W/L.L. (IV SETS) IMPLANT
TUBING HIGH PRESSURE 120CM (CONNECTOR) IMPLANT
UNDERPAD 30X36 HEAVY ABSORB (UNDERPADS AND DIAPERS) IMPLANT
WATER STERILE IRR 1000ML POUR (IV SOLUTION) ×3 IMPLANT
WIRE BENTSON .035X145CM (WIRE) ×3 IMPLANT
WIRE TORQFLEX AUST .018X40CM (WIRE) ×12 IMPLANT

## 2020-07-28 NOTE — Op Note (Addendum)
Date: July 28, 2020  Preoperative diagnosis: Extensive recurrent left lower extremity DVT  Postoperative diagnosis: Same  Procedure: 1.  Ultrasound-guided access of the left posterior tibial vein at the ankle 2.  Left lower extremity venogram including catheter selection of third order branches 3.  Ultrasound-guided access of the right internal jugular vein 4.  Right iliac and inferior venocavogram 5.  Unsuccessful attempt at crossing chronic left common iliac, external iliac and common femoral vein occlusion (both antegrade and retorgrade attempt)  Surgeon: Dr. Marty Heck, MD  Assistant: Dr. Servando Snare, MD  Indications: Patient is a 29 year old male who has a complex history when he initially presented to the ED with a left popliteal, superficial femoral and common femoral vein DVT in April 2021.  He initially improved on anticoagulation and on follow-up had worsening symptoms in his leg and underwent percutaneous mechanical venous thrombectomy of mostly chronic appearing thrombus back in May 2021.  Ultimately he represented about 3 months later and had extensive extension of his DVT into the left iliac vein.  I brought him back and attempted popliteal access unsuccessful and ultimately underwent proximal left common femoral/external iliac vein access above his occluded segment and performed thrombolysis and percutaneous mechanical thrombectomy of his left iliac system.  This all reoccluded while he was therapeutic on Coumadin.  We have now switched him to Lovenox and he has been evaluated by heme-onc for further work-up.  He presents today for one final endovascular attempt to cross his left chronic common femoral occlusion and possible stenting after risk benefits discussed.  An assistant was needed given the complexity of the case.  Findings: After left posterior tibial vein access at the ankle this showed very diseased popliteal vein with 1 of 2 paired superficial femoral  veins that were patent but then occluded in the proximal thigh with a completely occluded left common femoral with multiple collaterals that all appears chronic as previously documented.  His left great saphenous vein was patent but did not empty into the common femoral vein given it was occluded but rather had a collateral filling a large pelvic collateral that ultimately went to the right iliac vein (the single superficial femoral vein that it patent also fills into this pelvic collateral as well).  The left external and common iliac vein appeared completely occluded and we could not get access or cross the left iliac vein occlusion.  We tried both a antegrade attempt from the left posterior tibial vein as well as accessing the right IJ and coming down the IVC retrograde and ultimately were unable to cross the left iliac or common femoral vein occlusion.  Anesthesia: General  Details: Patient was taken to the operating room after informed consent was obtained.  Placed on operative table supine position.  General endotracheal anesthesia was induced.  Left leg and both sides of his neck were then prepped in usual sterile fashion.  A timeout was performed identify patient, procedure and site.  Dr. Donzetta Matters initially started the case and accessed the left posterior tibial vein at the ankle ultimately with a micro access needle under ultrasound guidnce.  The vein was evaluated, it was patent, an image was saved.  Then placed a short 5 French sheath in the left posterior tibial vein.  Left lower extremity venogram was then obtained with pertinent findings noted above.  Then using a Glidewire with a KMP catheter he was able to come up the tibial vein into the popliteal vein where he met a lot of  resistance from diseased segment and was finally able to cross over into the one superficial femoral paired vein that was patent.  Ultimately he could not get the KMP catheter to track and had to switch out for a quick cross  catheter and was able to get the wire up to the common femoral vein occlusion on the left but then could not get any wire to cross even attempting to use a loop in the wire for more support and exchanging for a glidewire advatnage.  Attempted to exchange out for a Glidewire catheter for more support unsuccessful.  At that point in time we accessed the right internal jugular vein.  The internal jugular vein was evaluated with ultrasound, it was patent, an image was saved.  Then accessed this with micro access needle placed a microwire and then a micro sheath.  Get a Bentson wire all the way down into the inferior vena cava and exchanged for a short 5 French sheath in the right IJ.  I then used a multitude of angled KMP catheters as well as BER 2 catheter and angled guide cath as well as a Glidewire advantage and soft angle glidewire and could not cannulate the left iliac venous system and appeared occluded.  Even with multiple hand-injection venograms we could not visualize a left iliac stump and after attempting under fluoroscopy for a long period of time we still could not get down the left iliac venous system.  We elected to stop the case at this point time to prevent losing any collaterals.  Wires and catheters were removed.  The sheath in the left posterior tibial vein at his ankle and the sheath in the right IJ were removed and manual pressure was held.  Sterile dressings were applied.  Complication: None  Condition: Stable  Plan: Discussed with family that patient should continue his treatment dose Lovenox with elevation and compression using thigh-high compression.  I do not think there are any further endovascular options for him.  I think any open venous bypass would be high risk for losing collaterals and potentially making him worse with concern overall poor durability.  Marty Heck, MD [facility] Office: [place]

## 2020-07-28 NOTE — Interval H&P Note (Signed)
History and Physical Interval Note:  07/28/2020 1:14 PM  Noah Moore  has presented today for surgery, with the diagnosis of LOWER EXTREMITY SWELLING.  The various methods of treatment have been discussed with the patient and family. After consideration of risks, benefits and other options for treatment, the patient has consented to  Procedure(s): LEFT LEG VENOGRAM (Left) INTRAVASCULAR ULTRASOUND (IVUS) (Left) INSERTION OF ILIAC STENT (Left) as a surgical intervention.  The patient's history has been reviewed, patient examined, no change in status, stable for surgery.  I have reviewed the patient's chart and labs.  Questions were answered to the patient's satisfaction.     Servando Snare

## 2020-07-28 NOTE — Transfer of Care (Signed)
Immediate Anesthesia Transfer of Care Note  Patient: Noah Moore  Procedure(s) Performed: CENTRAL VENOGRAM AND LEFT LOWER EXTREMITY VENOGRAM (Left ) ULTRASOUND GUIDANCE ACCESS OF RIGHT INTERNAL JUGULAR, ULTRASOUND GUIDANCE ACCESS OF POSTERIOR TIBIAL VEIN (N/A )  Patient Location: PACU  Anesthesia Type:General  Level of Consciousness: awake, alert  and oriented  Airway & Oxygen Therapy: Patient Spontanous Breathing  Post-op Assessment: Report given to RN and Post -op Vital signs reviewed and stable  Post vital signs: Reviewed and stable  Last Vitals:  Vitals Value Taken Time  BP 130/78 07/28/20 1609  Temp 36.9 C 07/28/20 1610  Pulse 97 07/28/20 1614  Resp 21 07/28/20 1614  SpO2 100 % 07/28/20 1614  Vitals shown include unvalidated device data.  Last Pain:  Vitals:   07/28/20 1056  TempSrc:   PainSc: 0-No pain      Patients Stated Pain Goal: 3 (82/08/13 8871)  Complications: No complications documented.

## 2020-07-28 NOTE — Anesthesia Preprocedure Evaluation (Signed)
Anesthesia Evaluation  Patient identified by MRN, date of birth, ID band Patient awake    Reviewed: Allergy & Precautions, NPO status , Patient's Chart, lab work & pertinent test results  History of Anesthesia Complications Negative for: history of anesthetic complications  Airway Mallampati: II  TM Distance: >3 FB Neck ROM: Full    Dental  (+) Dental Advisory Given, Teeth Intact   Pulmonary neg recent URI, former smoker,  Covid-19 Nucleic Acid Test Results Lab Results      Component                Value               Date                      SARSCOV2NAA              NEGATIVE            07/26/2020                [place]              NEGATIVE            06/21/2020                [place]              Not Detected        05/02/2020                [place]              NEGATIVE            03/29/2020                [place]              NEGATIVE            03/17/2020              breath sounds clear to auscultation       Cardiovascular + DVT   Rhythm:Regular     Neuro/Psych negative neurological ROS  negative psych ROS   GI/Hepatic Neg liver ROS, GERD  ,  Endo/Other  negative endocrine ROS  Renal/GU negative Renal ROS     Musculoskeletal negative musculoskeletal ROS (+)   Abdominal   Peds  Hematology Lab Results      Component                Value               Date                      WBC                      12.1 (H)            07/06/2020                HGB                      13.1                07/06/2020                HCT                      40.9  07/06/2020                MCV                      86.8                07/06/2020                PLT                      524 (H)             07/06/2020              Anesthesia Other Findings   Reproductive/Obstetrics                             Anesthesia Physical Anesthesia Plan  ASA: II  Anesthesia  Plan: General   Post-op Pain Management:    Induction: Intravenous  PONV Risk Score and Plan: 2 and Treatment may vary due to age or medical condition, Ondansetron and Dexamethasone  Airway Management Planned: Oral ETT  Additional Equipment: None  Intra-op Plan:   Post-operative Plan: Extubation in OR  Informed Consent: I have reviewed the patients History and Physical, chart, labs and discussed the procedure including the risks, benefits and alternatives for the proposed anesthesia with the patient or authorized representative who has indicated his/her understanding and acceptance.     Dental advisory given  Plan Discussed with: CRNA and Surgeon  Anesthesia Plan Comments:         Anesthesia Quick Evaluation

## 2020-07-28 NOTE — Discharge Instructions (Signed)
Keep right neck and left leg puncture sites clean and dry for 24 hours.  No strenuous exercise or heavy lifting for 24 hours.

## 2020-07-28 NOTE — Anesthesia Procedure Notes (Signed)
Procedure Name: Intubation Date/Time: 07/28/2020 1:47 PM Performed by: Eligha Bridegroom, CRNA Pre-anesthesia Checklist: Patient identified, Emergency Drugs available, Suction available and Timeout performed Patient Re-evaluated:Patient Re-evaluated prior to induction Oxygen Delivery Method: Circle system utilized Preoxygenation: Pre-oxygenation with 100% oxygen Induction Type: IV induction Ventilation: Mask ventilation without difficulty and Oral airway inserted - appropriate to patient size Laryngoscope Size: Mac and 4 Grade View: Grade I Tube type: Oral Tube size: 8.0 mm Number of attempts: 1 Airway Equipment and Method: Stylet Placement Confirmation: ETT inserted through vocal cords under direct vision,  positive ETCO2 and breath sounds checked- equal and bilateral Secured at: 23 cm Tube secured with: Tape Dental Injury: Teeth and Oropharynx as per pre-operative assessment

## 2020-07-29 ENCOUNTER — Encounter (HOSPITAL_COMMUNITY): Payer: Self-pay | Admitting: Vascular Surgery

## 2020-08-02 NOTE — Anesthesia Postprocedure Evaluation (Signed)
Anesthesia Post Note  Patient: Noah Moore  Procedure(s) Performed: CENTRAL VENOGRAM AND LEFT LOWER EXTREMITY VENOGRAM (Left ) ULTRASOUND GUIDANCE ACCESS OF RIGHT INTERNAL JUGULAR, ULTRASOUND GUIDANCE ACCESS OF POSTERIOR TIBIAL VEIN (N/A )     Patient location during evaluation: PACU Anesthesia Type: General Level of consciousness: awake and alert Pain management: pain level controlled Vital Signs Assessment: post-procedure vital signs reviewed and stable Respiratory status: spontaneous breathing, nonlabored ventilation, respiratory function stable and patient connected to nasal cannula oxygen Cardiovascular status: blood pressure returned to baseline and stable Postop Assessment: no apparent nausea or vomiting Anesthetic complications: no   No complications documented.  Last Vitals:  Vitals:   07/28/20 1640 07/28/20 1645  BP: (!) 130/99   Pulse: 87 90  Resp: 19 14  Temp: 36.9 C   SpO2: 95% 100%    Last Pain:  Vitals:   07/28/20 1610  TempSrc:   PainSc: 8                  Aishwarya Shiplett

## 2020-08-03 ENCOUNTER — Other Ambulatory Visit: Payer: Self-pay | Admitting: Hematology and Oncology

## 2020-08-03 ENCOUNTER — Inpatient Hospital Stay
Payer: No Typology Code available for payment source | Attending: Hematology and Oncology | Admitting: Hematology and Oncology

## 2020-08-03 ENCOUNTER — Inpatient Hospital Stay: Payer: No Typology Code available for payment source

## 2020-08-03 DIAGNOSIS — Z7901 Long term (current) use of anticoagulants: Secondary | ICD-10-CM

## 2020-08-03 DIAGNOSIS — Z23 Encounter for immunization: Secondary | ICD-10-CM | POA: Insufficient documentation

## 2020-08-03 DIAGNOSIS — Z79899 Other long term (current) drug therapy: Secondary | ICD-10-CM | POA: Insufficient documentation

## 2020-08-03 DIAGNOSIS — D6862 Lupus anticoagulant syndrome: Secondary | ICD-10-CM | POA: Insufficient documentation

## 2020-08-03 DIAGNOSIS — Z86718 Personal history of other venous thrombosis and embolism: Secondary | ICD-10-CM | POA: Insufficient documentation

## 2020-08-03 MED ORDER — ENOXAPARIN SODIUM 150 MG/ML ~~LOC~~ SOLN: 130.0000 mg | Freq: Two times a day (BID) | SUBCUTANEOUS | 1 refills | Status: DC

## 2020-08-09 ENCOUNTER — Encounter: Payer: Self-pay | Admitting: Adult Health

## 2020-08-09 ENCOUNTER — Other Ambulatory Visit: Payer: Self-pay | Admitting: Adult Health

## 2020-08-18 ENCOUNTER — Telehealth: Payer: Self-pay | Admitting: *Deleted

## 2020-08-18 NOTE — Telephone Encounter (Signed)
Received call from patient. He states he has broken out in a rash about 1 week ago-on his arms, legs and lower back. He states the only medication he takes in Lovenox BID. Discussed with Dr. Lorenso Courier. Dr. Lorenso Courier advised that he would like to see pt tomorrow. Scheduled for 08/19/20 @11 :00 for MD, !0:30 for labs.Pt aware.

## 2020-08-19 ENCOUNTER — Other Ambulatory Visit: Payer: Self-pay

## 2020-08-19 ENCOUNTER — Inpatient Hospital Stay: Payer: No Typology Code available for payment source

## 2020-08-19 ENCOUNTER — Encounter: Payer: Self-pay | Admitting: Hematology and Oncology

## 2020-08-19 ENCOUNTER — Other Ambulatory Visit (HOSPITAL_BASED_OUTPATIENT_CLINIC_OR_DEPARTMENT_OTHER): Payer: No Typology Code available for payment source | Admitting: Hematology and Oncology

## 2020-08-19 ENCOUNTER — Inpatient Hospital Stay: Payer: No Typology Code available for payment source | Admitting: Hematology and Oncology

## 2020-08-19 VITALS — BP 114/78 | HR 78 | Temp 98.1°F | Resp 18 | Ht 73.0 in | Wt 291.0 lb

## 2020-08-19 DIAGNOSIS — I8290 Acute embolism and thrombosis of unspecified vein: Secondary | ICD-10-CM | POA: Diagnosis not present

## 2020-08-19 DIAGNOSIS — Z79899 Other long term (current) drug therapy: Secondary | ICD-10-CM | POA: Diagnosis not present

## 2020-08-19 DIAGNOSIS — I824Z2 Acute embolism and thrombosis of unspecified deep veins of left distal lower extremity: Secondary | ICD-10-CM

## 2020-08-19 DIAGNOSIS — Z23 Encounter for immunization: Secondary | ICD-10-CM | POA: Diagnosis not present

## 2020-08-19 DIAGNOSIS — D6862 Lupus anticoagulant syndrome: Secondary | ICD-10-CM | POA: Diagnosis not present

## 2020-08-19 DIAGNOSIS — Z86718 Personal history of other venous thrombosis and embolism: Secondary | ICD-10-CM | POA: Diagnosis not present

## 2020-08-19 DIAGNOSIS — Z7901 Long term (current) use of anticoagulants: Secondary | ICD-10-CM | POA: Diagnosis not present

## 2020-08-19 LAB — CMP (CANCER CENTER ONLY)
ALT: 15 U/L (ref 0–44)
AST: 12 U/L — ABNORMAL LOW (ref 15–41)
Albumin: 3.8 g/dL (ref 3.5–5.0)
Alkaline Phosphatase: 100 U/L (ref 38–126)
Anion gap: 5 (ref 5–15)
BUN: 5 mg/dL — ABNORMAL LOW (ref 6–20)
CO2: 28 mmol/L (ref 22–32)
Calcium: 9.6 mg/dL (ref 8.9–10.3)
Chloride: 106 mmol/L (ref 98–111)
Creatinine: 0.89 mg/dL (ref 0.61–1.24)
GFR, Est AFR Am: >60 mL/min (ref >60)
GFR, Estimated: >60 mL/min (ref >60)
Glucose, Bld: 91 mg/dL (ref 70–99)
Potassium: 4.1 mmol/L (ref 3.5–5.1)
Sodium: 139 mmol/L (ref 135–145)
Total Bilirubin: 0.4 mg/dL (ref 0.3–1.2)
Total Protein: 8.3 g/dL — ABNORMAL HIGH (ref 6.5–8.1)

## 2020-08-19 LAB — D-DIMER, QUANTITATIVE: D-Dimer, Quant: 0.62 ug/mL-FEU — ABNORMAL HIGH (ref 0.00–0.50)

## 2020-08-19 LAB — CBC WITH DIFFERENTIAL (CANCER CENTER ONLY)
Abs Immature Granulocytes: 0.06 10*3/uL (ref 0.00–0.07)
Basophils Absolute: 0.1 10*3/uL (ref 0.0–0.1)
Basophils Relative: 1 %
Eosinophils Absolute: 0.1 10*3/uL (ref 0.0–0.5)
Eosinophils Relative: 1 %
HCT: 38 % — ABNORMAL LOW (ref 39.0–52.0)
Hemoglobin: 11.9 g/dL — ABNORMAL LOW (ref 13.0–17.0)
Immature Granulocytes: 1 %
Lymphocytes Relative: 20 %
Lymphs Abs: 1.8 10*3/uL (ref 0.7–4.0)
MCH: 27.2 pg (ref 26.0–34.0)
MCHC: 31.3 g/dL (ref 30.0–36.0)
MCV: 87 fL (ref 80.0–100.0)
Monocytes Absolute: 0.8 10*3/uL (ref 0.1–1.0)
Monocytes Relative: 9 %
Neutro Abs: 6.1 10*3/uL (ref 1.7–7.7)
Neutrophils Relative %: 68 %
Platelet Count: 353 10*3/uL (ref 150–400)
RBC: 4.37 MIL/uL (ref 4.22–5.81)
RDW: 14.7 % (ref 11.5–15.5)
WBC Count: 8.9 10*3/uL (ref 4.0–10.5)
nRBC: 0 % (ref 0.0–0.2)

## 2020-08-19 LAB — ANTITHROMBIN III: AntiThromb III Func: 85 % (ref 75–120)

## 2020-08-19 LAB — SAVE SMEAR(SSMR), FOR PROVIDER SLIDE REVIEW

## 2020-08-19 LAB — FIBRINOGEN: Fibrinogen: 478 mg/dL — ABNORMAL HIGH (ref 210–475)

## 2020-08-19 MED ORDER — DIPHENHYDRAMINE HCL 50 MG PO TABS: 50.0000 mg | ORAL_TABLET | Freq: Every evening | ORAL | 0 refills | Status: DC | PRN

## 2020-08-19 MED ORDER — INFLUENZA VAC SPLIT QUAD 0.5 ML IM SUSY
0.5000 mL | PREFILLED_SYRINGE | Freq: Once | INTRAMUSCULAR | Status: AC
  Administered 2020-08-19: 0.5 mL via INTRAMUSCULAR

## 2020-08-19 MED ORDER — INFLUENZA VAC SPLIT QUAD 0.5 ML IM SUSY
PREFILLED_SYRINGE | INTRAMUSCULAR | Status: AC
  Filled 2020-08-19: qty 0.5

## 2020-08-19 MED ORDER — CETIRIZINE HCL 10 MG PO TABS: 10.0000 mg | ORAL_TABLET | Freq: Every day | ORAL | 0 refills | Status: DC

## 2020-08-19 NOTE — Patient Instructions (Signed)
Received Flu vaccine information

## 2020-08-19 NOTE — Progress Notes (Signed)
Entered in error

## 2020-08-20 LAB — THROMBIN TIME: Thrombin Time: 20.5 s (ref 0.0–23.0)

## 2020-08-20 LAB — LIPOPROTEIN A (LPA): Lipoprotein (a): 121.9 nmol/L — ABNORMAL HIGH (ref <75.0)

## 2020-08-23 ENCOUNTER — Encounter: Payer: Self-pay | Admitting: Vascular Surgery

## 2020-08-23 ENCOUNTER — Ambulatory Visit (INDEPENDENT_AMBULATORY_CARE_PROVIDER_SITE_OTHER): Payer: No Typology Code available for payment source | Admitting: Vascular Surgery

## 2020-08-23 ENCOUNTER — Other Ambulatory Visit: Payer: Self-pay

## 2020-08-23 ENCOUNTER — Encounter: Payer: Self-pay | Admitting: Hematology and Oncology

## 2020-08-23 VITALS — BP 119/83 | HR 91 | Temp 98.2°F | Resp 16 | Ht 76.0 in | Wt 288.0 lb

## 2020-08-23 DIAGNOSIS — I825Z2 Chronic embolism and thrombosis of unspecified deep veins of left distal lower extremity: Secondary | ICD-10-CM | POA: Diagnosis not present

## 2020-08-23 NOTE — Progress Notes (Signed)
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 [place] LE showed acute deep vein thrombosis involving the left common femoral vein, left femoral vein, and left popliteal vein. Started on anticoagulation therapy. Admitted 4/21-4/24/2021. D/c on Xarelto 2) 03/17/2020: CTA shows no evidence of PE 3) 03/30/2020: patient presented to ED with worsening pain. Admitted again and underwent peripheral vascular thrombectomy. Transitioned to Apixaban. 4) 04/11/2020: establish care with Dr. Lorenso Courier  5) 06/21/2020-06/26/2020: developed new VTEs requiring hospitalization and thrombolysis/mechanical thrombectomy of the left external iliac vein, common iliac vein, and IVC with vascular surgery on 06/23/2020. He was transitioned to coumadin at that time. 6) 07/04/2020: patient therapeutic on coumadin at INR 2.3, underwent US lower extremity with showed new thrombus in the distal IVC, external iliac and internal iliac veins. 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 male with medical history significant for extensive provoked LLE DVT who presents for a follow up visit. The patient's last visit was on 07/06/2020 at which time he had recently started lovenox therapy. In the interim since the last visit 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 notes he feels improved from his prior visit.  Unfortunately the attempt by surgery to perform repeat thrombectomy was not successful, however on the Lovenox therapy his erythema, swelling, and discomfort have improved.  He notes he has developed some itching in the interim reporting that it does keep him up at night.  He reports that he developed little bumps on his skin and when he showers it feels much better.  He reports it has been going on for about 2 weeks or so and it is most prominent on his legs, arms, and back.  He reports he has not been leaving the house much or working much and he still does have quite a bit of fatigue.    Fortunately his weight has not continue to decline and he is currently at 291 pounds.  He does note that he is developing small round lesions in his abdomen at the sites where he is receiving his Lovenox shots.  Otherwise he denies having any overt signs of bleeding, bruising, or dark stools.  He denies fevers, chills, sweats, nausea, vomiting or diarrhea.  A full 10 point ROS is listed below.  MEDICAL HISTORY:  Past Medical History:  Diagnosis Date  . Allergy   . DVT (deep venous thrombosis) ([place])   . DVT (deep venous thrombosis) ([place]) 02/2020  . GERD (gastroesophageal reflux disease)     SURGICAL HISTORY: Past Surgical History:  Procedure Laterality Date  . INTRAVASCULAR ULTRASOUND/IVUS N/A 03/30/2020   Procedure: INTRAVASCULAR ULTRASOUND/IVUS;  Surgeon: Marty Heck, MD;  Location: [place] [facility];  Service: Cardiovascular;  Laterality: N/A;  . INTRAVASCULAR ULTRASOUND/IVUS N/A 06/24/2020   Procedure: Intravascular Ultrasound/IVUS;  Surgeon: Marty Heck, MD;  Location: [place] [facility];  Service: Cardiovascular;  Laterality: N/A;  . IVC VENOGRAPHY N/A 03/30/2020    Procedure: IVC Venography;  Surgeon: Marty Heck, MD;  Location: [place] [facility];  Service: Cardiovascular;  Laterality: N/A;  . KNEE SURGERY Right   . LEG SURGERY     Reports history of right lower extremity tibia fracture in 2010 which was repaired surgically.  . LOWER EXTREMITY VENOGRAPHY Left 03/30/2020   Procedure: LOWER EXTREMITY VENOGRAPHY;  Surgeon: Marty Heck, MD;  Location: [place] [facility];  Service: Cardiovascular;  Laterality: Left;  . LOWER EXTREMITY VENOGRAPHY  06/23/2020   Procedure: LOWER EXTREMITY VENOGRAPHY;  Surgeon: Marty Heck, MD;  Location: [place] [facility];  Service: Cardiovascular;;  . PERIPHERAL VASCULAR THROMBECTOMY Left 03/30/2020   Procedure: PERIPHERAL VASCULAR THROMBECTOMY;  Surgeon: Marty Heck, MD;  Location: [place] [facility];  Service: Cardiovascular;  Laterality: Left;  . PERIPHERAL VASCULAR THROMBECTOMY N/A 06/24/2020   Procedure: PERIPHERAL VASCULAR THROMBECTOMY;  Surgeon: Marty Heck, MD;  Location: [place] [facility];  Service: Cardiovascular;  Laterality: N/A;  . THROMBECTOMY  03/2020  . ULTRASOUND GUIDANCE FOR VASCULAR ACCESS N/A 07/28/2020   Procedure: ULTRASOUND GUIDANCE ACCESS OF RIGHT INTERNAL JUGULAR, ULTRASOUND GUIDANCE ACCESS OF POSTERIOR TIBIAL VEIN;  Surgeon: Waynetta Sandy, MD;  Location: [place];  Service: Vascular;  Laterality: N/A;  . VENOGRAM Left 07/28/2020   Procedure: CENTRAL VENOGRAM AND LEFT LOWER EXTREMITY VENOGRAM;  Surgeon: Waynetta Sandy, MD;  Location: [place];  Service: Vascular;  Laterality: Left;  . WISDOM TOOTH EXTRACTION      SOCIAL HISTORY: Social History   Socioeconomic History  . Marital status: Single    Spouse name: Not on file  . Number of children: Not on file  . Years of education: Not on file  . Highest education level: Not on file  Occupational History  . Not on file  Tobacco Use  . Smoking status: Former Smoker    Types: E-cigarettes,  Cigarettes  . Smokeless tobacco: Never Used  Vaping Use  . Vaping Use: Former  Substance and Sexual Activity  . Alcohol use: Not Currently  . Drug use: Not Currently  . Sexual activity: Yes    Birth control/protection: None  Other Topics Concern  . Not on file  Social History Narrative   ** Merged History Encounter **       Social Determinants of Health   Financial Resource Strain:   . Difficulty of Paying Living Expenses: Not on file  Food Insecurity:   . Worried About Charity fundraiser in the Last Year: Not on file  . Ran Out of Food in the Last Year: Not on file  Transportation Needs:   . Lack of Transportation (Medical): Not on file  . Lack of Transportation (Non-Medical): Not on file  Physical Activity:   . Days of Exercise per Week: Not on file  . Minutes of Exercise per Session: Not on file  Stress:   . Feeling of Stress : Not on file  Social Connections:   . Frequency of Communication with Friends and Family: Not on file  . Frequency of Social Gatherings with Friends and Family: Not on file  . Attends Religious Services: Not on file  . Active Member of Clubs or Organizations: Not on file  . Attends Archivist Meetings: Not on file  . Marital Status: Not on file  Intimate Partner Violence:   .  Fear of Current or Ex-Partner: Not on file  . Emotionally Abused: Not on file  . Physically Abused: Not on file  . Sexually Abused: Not on file    FAMILY HISTORY: Family History  Adopted: Yes  Problem Relation Age of Onset  . Venous thrombosis Neg Hx     ALLERGIES:  has No Known Allergies.  MEDICATIONS:  Current Outpatient Medications  Medication Sig Dispense Refill  . cetirizine (ZYRTEC ALLERGY) 10 MG tablet Take 1 tablet (10 mg total) by mouth daily. 30 tablet 0  . diphenhydrAMINE (BENADRYL) 50 MG tablet Take 1 tablet (50 mg total) by mouth at bedtime as needed for itching. 30 tablet 0  . enoxaparin (LOVENOX) 150 MG/ML injection Inject 0.86 mLs (130  mg total) into the skin 2 (two) times daily. 51.6 mL 1  . HYDROcodone-acetaminophen (NORCO/VICODIN) 5-325 MG tablet Take 1 tablet by mouth every 6 (six) hours as needed for moderate pain. (Patient not taking: Reported on 08/19/2020) 15 tablet 0   No current facility-administered medications for this visit.    REVIEW OF SYSTEMS:   Constitutional: ( - ) fevers, ( - )  chills , ( - ) night sweats Eyes: ( - ) blurriness of vision, ( - ) double vision, ( - ) watery eyes Ears, nose, mouth, throat, and face: ( - ) mucositis, ( - ) sore throat Respiratory: ( - ) cough, ( - ) dyspnea, ( - ) wheezes Cardiovascular: ( - ) palpitation, ( - ) chest discomfort, ( - ) lower extremity swelling Gastrointestinal:  ( - ) nausea, ( - ) heartburn, ( - ) change in bowel habits Skin: ( - ) abnormal skin rashes Lymphatics: ( - ) new lymphadenopathy, ( - ) easy bruising Neurological: ( - ) numbness, ( - ) tingling, ( - ) new weaknesses Behavioral/Psych: ( - ) mood change, ( - ) new changes  All other systems were reviewed with the patient and are negative.  PHYSICAL EXAMINATION: ECOG PERFORMANCE STATUS: 0 - Asymptomatic  Vitals:   08/19/20 1101  BP: 114/78  Pulse: 78  Resp: 18  Temp: 98.1 F (36.7 C)  SpO2: 100%   Filed Weights   08/19/20 1101  Weight: 291 lb (132 kg)    GENERAL: well appearing young male,  alert, no distress and comfortable SKIN: small raised erythematous lesions on the left lower extremity, up to the level of the mid thigh. Tender to palpation. No skin lesions elsewhere.  EYES: conjunctiva are pink and non-injected, sclera clear LUNGS: clear to auscultation and percussion with normal breathing effort HEART: regular rate & rhythm and no murmurs. Musculoskeletal: no cyanosis of digits and no clubbing. +1 LE edema of the LLE. No erythema or tenderness of the LLE.  PSYCH: alert & oriented x 3, fluent speech NEURO: no focal motor/sensory deficits  LABORATORY DATA:  I have reviewed  the data as listed CBC Latest Ref Rng & Units 08/19/2020 07/28/2020 07/06/2020  WBC 4.0 - 10.5 K/uL 8.9 8.0 12.1(H)  Hemoglobin 13.0 - 17.0 g/dL 11.9(L) 12.7(L) 13.1  Hematocrit 39 - 52 % 38.0(L) 40.7 40.9  Platelets 150 - 400 K/uL 353 329 524(H)    CMP Latest Ref Rng & Units 08/19/2020 07/28/2020 07/06/2020  Glucose 70 - 99 mg/dL 91 90 85  BUN 6 - 20 mg/dL 5(L) <5(L) 5(L)  Creatinine 0.61 - 1.24 mg/dL 0.89 0.80 0.83  Sodium 135 - 145 mmol/L 139 139 138  Potassium 3.5 - 5.1 mmol/L 4.1 3.7 4.2  Chloride  98 - 111 mmol/L 106 104 102  CO2 22 - 32 mmol/L 28 24 24   Calcium 8.9 - 10.3 mg/dL 9.6 9.3 10.1  Total Protein 6.5 - 8.1 g/dL 8.3(H) - 8.9(H)  Total Bilirubin 0.3 - 1.2 mg/dL 0.4 - 0.6  Alkaline Phos 38 - 126 U/L 100 - 118  AST 15 - 41 U/L 12(L) - 16  ALT 0 - 44 U/L 15 - 22    RADIOGRAPHIC STUDIES: HYBRID OR IMAGING ([facility] ONLY)  Result Date: 07/28/2020 There is no interpretation for this exam.  This order is for images obtained during a surgical procedure.  Please See "Surgeries" Tab for more information regarding the procedure.    ASSESSMENT & PLAN Eaton Folmar 29 y.o. male with medical history significant for extensive provoked LLE DVT who presents for a follow up visit.  After review the labs, discussion with the patient, and review of the prior records the patient's findings are most consistent with recurrent VTEs of unclear etiology.  On exam today [name] is proved from prior.  He is not having as much pain and swelling or erythema in his left lower extremity as he was during his prior visit.  He has developed a light rash over his upper extremities and chest which has been pruritic in nature, but otherwise tolerable.  He has had no issues with chest pain, or shortness of breath.  He also denies having any issues with bleeding, bruising, or dark stools while on the Lovenox therapy.  He has noticed some "knots" in his stomach, but otherwise has been quite well.  During the  patient's prior hospitalization he was seen by Dr. Monica Martinez with vascular surgery who performed thrombolysis and percutaneous mechanical thrombectomy of the left external iliac, common iliac, distal IVC on 06/24/2020.  Due to concern the fact that the occurred while on anticoagulation therapy with Eliquis he was transitioned to Coumadin therapy at time of discharge and is subsequently established with Coumadin clinic in the outpatient setting.  After d/c of Lovenox on Saturday the patient began to have worsening leg swelling and developed tender erythematous raised lesions. These have subsequently resolved. 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 Patient appears stable on lovenox at this time.  --prior hypercoagulation/inflammatory workups were unrevealing. Will order additional labs today including thrombin time, antithrombin III, fibrinogen, lipoprotein A. --will also attempt to r/o malignancy with a testicular US (prior imaging of Chest (02/2020) and abdomen/pelvis (05/2020) showed no evidence of  disease)  --if no clear answer can be reached will need to make referral to tertiary care center for hematological evaluation. Would recommend [facility].  --vascular surgery evaluated for a compressive syndrome (such as May Thurner). No evidence  of this was found. Appreciate their assistance in this case.  --RTC placeholder scheduled in 2 months time.   No orders of the defined types were placed in this encounter.  All questions were answered. The patient knows to call the clinic with any problems, questions or concerns.  A total of more than 30 minutes were spent on this encounter and over half of that time was spent on counseling and coordination of care as outlined above.   Ledell Peoples, MD Department of Hematology/Oncology [place] at [facility] Phone: [phone] Pager: [phone] Email: [email or web address]  08/23/2020 6:36 PM

## 2020-08-23 NOTE — Progress Notes (Signed)
Patient name: Noah Moore MRN: [id number] DOB: 03-12-1991 Sex: male  REASON FOR VISIT: F/U extensive left leg DVT   HPI: Noah Moore is a 29 y.o. male that presents for continued follow-up of extensive left leg DVT.  He has a very complex history.  As previously noted, he initially was diagnosed with a DVT in April 2021 in the left common femoral, superficial femoral and popliteal vein.  Ultimately was seen in the ED and discharged after seeing improvement on heparin and no intervention was initially performed.  On follow-up in the clinic he had worsening symptoms and had percutaneous mechanical thrombectomy of the left lower extremity with balloon angioplasty of the left femoral-popliteal vein on 03/30/2020.  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  There was no thrombus in the iliac or IVC.  No evidence of May Thurner.  This DVT was thought to be provoked from a long drive (>5 hours) after further evaluation with hem-onc.  He has unknown family history as he is adopted.  He was initially put on Xarelto but was transitioned to Eliquis due to bleeding gums.   He subsequently presented to the ER at the end of July where he had a CT of the abdomen and pelvis with contrast that showed extensive DVT of profunda femoral vein to the junction of the common iliac veins and likely into the inferior aspect of the IVC. He has was started on IV heparin.  Ultimately we attempted another intervention via popliteal access but could not get across a left common femoral vein occlusion with a large pelvic collateral.  Ultimately was able to access the common femoral vein with him supine and much more proximal near the iliac vein junction above the occlusion and ultimately performed lysis and then percutaneous mechanical thrombectomy and venoplasty.  All of this then reoccluded within a week while being therapeutic on Coumadin after further input from  hem onc.   He has been seen by Dr. Lorenso Courier again with heme-onc and placed on Lovenox in the interim.   On further follow-up felt his leg was doing worse.  Ultimately we took him to the hybrid OR and attempted both a pedal venous access and a right IJ access to cross his left common femoral and iliac occluded segment that was unsuccessful.  Ultimately he was discharged on Lovenox and we discussed no further endovascular options.  On follow-up today feels like his leg is doing better.  He still taking Lovenox shots and is a bit frustrated with the injections but otherwise is back to work and not having any significant pain in his leg.  He has a thigh high compression stocking.  Past Medical History:  Diagnosis Date  . Allergy   . DVT (deep venous thrombosis) ([place])   . DVT (deep venous thrombosis) ([place]) 02/2020  . GERD (gastroesophageal reflux disease)     Past Surgical History:  Procedure Laterality Date  . INTRAVASCULAR ULTRASOUND/IVUS N/A 03/30/2020   Procedure: INTRAVASCULAR ULTRASOUND/IVUS;  Surgeon: Marty Heck, MD;  Location: [place] [facility];  Service: Cardiovascular;  Laterality: N/A;  . INTRAVASCULAR ULTRASOUND/IVUS N/A 06/24/2020   Procedure: Intravascular Ultrasound/IVUS;  Surgeon: Marty Heck, MD;  Location: [facility];  Service: Cardiovascular;  Laterality: N/A;  . IVC VENOGRAPHY N/A 03/30/2020   Procedure: IVC Venography;  Surgeon: Marty Heck, MD;  Location: [place] [facility];  Service: Cardiovascular;  Laterality: N/A;  . KNEE  SURGERY Right   . LEG SURGERY     Reports history of right lower extremity tibia fracture in 2010 which was repaired surgically.  . LOWER EXTREMITY VENOGRAPHY Left 03/30/2020   Procedure: LOWER EXTREMITY VENOGRAPHY;  Surgeon: Marty Heck, MD;  Location: [facility] [facility];  Service: Cardiovascular;  Laterality: Left;  . LOWER EXTREMITY VENOGRAPHY  06/23/2020   Procedure: LOWER EXTREMITY VENOGRAPHY;  Surgeon:  Marty Heck, MD;  Location: [place] [facility];  Service: Cardiovascular;;  . PERIPHERAL VASCULAR THROMBECTOMY Left 03/30/2020   Procedure: PERIPHERAL VASCULAR THROMBECTOMY;  Surgeon: Marty Heck, MD;  Location: [place] [facility];  Service: Cardiovascular;  Laterality: Left;  . PERIPHERAL VASCULAR THROMBECTOMY N/A 06/24/2020   Procedure: PERIPHERAL VASCULAR THROMBECTOMY;  Surgeon: Marty Heck, MD;  Location: [place] [facility];  Service: Cardiovascular;  Laterality: N/A;  . THROMBECTOMY  03/2020  . ULTRASOUND GUIDANCE FOR VASCULAR ACCESS N/A 07/28/2020   Procedure: ULTRASOUND GUIDANCE ACCESS OF RIGHT INTERNAL JUGULAR, ULTRASOUND GUIDANCE ACCESS OF POSTERIOR TIBIAL VEIN;  Surgeon: Waynetta Sandy, MD;  Location: [place];  Service: Vascular;  Laterality: N/A;  . VENOGRAM Left 07/28/2020   Procedure: CENTRAL VENOGRAM AND LEFT LOWER EXTREMITY VENOGRAM;  Surgeon: Waynetta Sandy, MD;  Location: [place];  Service: Vascular;  Laterality: Left;  . WISDOM TOOTH EXTRACTION      Family History  Adopted: Yes  Problem Relation Age of Onset  . Venous thrombosis Neg Hx     SOCIAL HISTORY: Social History   Tobacco Use  . Smoking status: Former Smoker    Types: E-cigarettes, Cigarettes  . Smokeless tobacco: Never Used  Substance Use Topics  . Alcohol use: Not Currently    No Known Allergies  Current Outpatient Medications  Medication Sig Dispense Refill  . cetirizine (ZYRTEC ALLERGY) 10 MG tablet Take 1 tablet (10 mg total) by mouth daily. 30 tablet 0  . diphenhydrAMINE (BENADRYL) 50 MG tablet Take 1 tablet (50 mg total) by mouth at bedtime as needed for itching. 30 tablet 0  . enoxaparin (LOVENOX) 150 MG/ML injection Inject 0.86 mLs (130 mg total) into the skin 2 (two) times daily. 51.6 mL 1  . HYDROcodone-acetaminophen (NORCO/VICODIN) 5-325 MG tablet Take 1 tablet by mouth every 6 (six) hours as needed for moderate pain. (Patient not taking: Reported on  08/19/2020) 15 tablet 0   No current facility-administered medications for this visit.    REVIEW OF SYSTEMS:  [X]  denotes positive finding, [ ]  denotes negative finding Cardiac  Comments:  Chest pain or chest pressure:    Shortness of breath upon exertion:    Short of breath when lying flat:    Irregular heart rhythm:        Vascular    Pain in calf, thigh, or hip brought on by ambulation:    Pain in feet at night that wakes you up from your sleep:     Blood clot in your veins:    Leg swelling:  x Left       Pulmonary    Oxygen at home:    Productive cough:     Wheezing:         Neurologic    Sudden weakness in arms or legs:     Sudden numbness in arms or legs:     Sudden onset of difficulty speaking or slurred speech:    Temporary loss of vision in one eye:     Problems with dizziness:  Gastrointestinal    Blood in stool:     Vomited blood:         Genitourinary    Burning when urinating:     Blood in urine:        Psychiatric    Major depression:         Hematologic    Bleeding problems:    Problems with blood clotting too easily:        Skin    Rashes or ulcers:        Constitutional    Fever or chills:      PHYSICAL EXAM: Vitals:   08/23/20 0857  BP: 119/83  Pulse: 91  Resp: 16  Temp: 98.2 F (36.8 C)  TempSrc: Temporal  SpO2: 97%  Weight: 288 lb (130.6 kg)  Height: 6\' 4"  (1.93 m)    GENERAL: The patient is a well-nourished male, in no acute distress. The vital signs are documented above. CARDIAC: There is a regular rate and rhythm.  VASCULAR:  Right calf circumference 14 inches, left calf circumference 16 inches Overall left leg much improved No signs phlegmasia   DATA:   No new studies today.  Assessment/Plan:  29 year old male presents for continued follow-up of extensive left leg DVT.   He has had multiple percutaneous endovascular interventions as documented in the HPI above.  Most recently attempted both pedal venous  access and right IJ access to cross his occluded left common femoral and iliac vein segment that was unsuccessful with the help of my partner Dr. Donzetta Matters.  Fortunately his leg looks much better today.  Previously discussed after last intervention that he does not have any further endovascular options from my standpoint and would likely be facing an open venous bypass as a last resort.  Recommend that he continue thigh-high compression and no further plans for surgical intervention at this time.  Appreciate Dr. Libby Maw input from heme-onc and he is getting additional work-up.  I will defer to Dr. Lorenso Courier if he feels like he can be transitioned from Lovenox to some other oral agent in the future given he has been on Coumadin, Eliquis, and Xarelto in the past with clot formation.  Lovenox has certainly helped and his leg looks much improved today with the help of collaterals.  I will see him again in 6 months with left leg venous duplex.  Discussed he wear left leg thigh high compression.  Marty Heck, MD [facility] Office: [phone]

## 2020-08-24 ENCOUNTER — Other Ambulatory Visit: Payer: Self-pay | Admitting: *Deleted

## 2020-08-24 DIAGNOSIS — I825Z2 Chronic embolism and thrombosis of unspecified deep veins of left distal lower extremity: Secondary | ICD-10-CM

## 2020-08-30 ENCOUNTER — Other Ambulatory Visit: Payer: Self-pay

## 2020-08-30 ENCOUNTER — Ambulatory Visit (HOSPITAL_COMMUNITY)
Admission: RE | Admit: 2020-08-30 | Discharge: 2020-08-30 | Disposition: A | Discharge status: Home / Self Care | Payer: No Typology Code available for payment source | Source: Ambulatory Visit | Attending: Hematology and Oncology | Admitting: Hematology and Oncology

## 2020-08-30 DIAGNOSIS — I8290 Acute embolism and thrombosis of unspecified vein: Secondary | ICD-10-CM

## 2020-08-31 ENCOUNTER — Telehealth: Payer: Self-pay | Admitting: *Deleted

## 2020-08-31 NOTE — Telephone Encounter (Signed)
TCT patient regarding results of his scrotal US yesterday. Spoke with Noah Moore and informed him that the [place] was normal. No abnormalities. Also informed him that recent lab work did not provide any additional information that could help explain his current situation with clotting and vascular changes in legs. Advised that Dr. Lorenso Courier will review his case again with Dr. Dionne Milo @ [facility] and continue to work toward getting an appointment with him @ [facility].  Noah Moore voiced understanding. He states he is continuing to use the po Benadryl for rash. States that it has helped the itching. He is aware of his appointments in November 2021

## 2020-08-31 NOTE — Telephone Encounter (Signed)
-----   Message from Orson Slick, MD sent at 08/31/2020  8:34 AM EDT ----- Please call Noah Moore to let him know that his scrotal US showed no evidence of testicular cancer or other testicular abnormalities. The rest of the bloodwork did not reveal a clear cause of his findings.  I will review the case again with Dr. Dionne Milo. We are working toward getting him an appointment with Dr. Dionne Milo or his PA at [facility].  ----- Message ----- From: Interface, Rad Results In Sent: 08/30/2020   5:25 PM EDT To: Orson Slick, MD

## 2020-09-19 ENCOUNTER — Other Ambulatory Visit: Payer: 59

## 2020-09-19 ENCOUNTER — Ambulatory Visit: Payer: 59 | Admitting: Hematology and Oncology

## 2020-10-01 ENCOUNTER — Other Ambulatory Visit: Payer: Self-pay | Admitting: Hematology and Oncology

## 2020-10-01 DIAGNOSIS — Z7901 Long term (current) use of anticoagulants: Secondary | ICD-10-CM

## 2020-10-04 ENCOUNTER — Encounter (HOSPITAL_COMMUNITY): Payer: 59

## 2020-10-04 ENCOUNTER — Other Ambulatory Visit: Payer: Self-pay

## 2020-10-04 ENCOUNTER — Ambulatory Visit: Payer: 59 | Admitting: Vascular Surgery

## 2020-10-04 ENCOUNTER — Encounter: Payer: Self-pay | Admitting: Vascular Surgery

## 2020-10-04 ENCOUNTER — Ambulatory Visit (HOSPITAL_COMMUNITY)
Admission: RE | Admit: 2020-10-04 | Discharge: 2020-10-04 | Disposition: A | Discharge status: Home / Self Care | Payer: No Typology Code available for payment source | Source: Ambulatory Visit | Attending: Vascular Surgery | Admitting: Vascular Surgery

## 2020-10-04 ENCOUNTER — Ambulatory Visit: Payer: No Typology Code available for payment source | Admitting: Vascular Surgery

## 2020-10-04 VITALS — BP 118/80 | HR 88 | Temp 98.7°F | Resp 18 | Ht 76.0 in | Wt 293.0 lb

## 2020-10-04 DIAGNOSIS — I82522 Chronic embolism and thrombosis of left iliac vein: Secondary | ICD-10-CM | POA: Diagnosis not present

## 2020-10-04 DIAGNOSIS — I824Z2 Acute embolism and thrombosis of unspecified deep veins of left distal lower extremity: Secondary | ICD-10-CM | POA: Insufficient documentation

## 2020-10-04 NOTE — Progress Notes (Signed)
Patient name: Noah Moore MRN: [id number] DOB: Sep 25, 1991 Sex: male  REASON FOR VISIT: F/U extensive left leg DVT   HPI: Noah Moore is a 29 y.o. male that presents for continued follow-up of extensive left leg DVT.  He has a very complex history.  As previously noted, he initially was diagnosed with a DVT in April 2021 in the left common femoral, superficial femoral and popliteal vein.  Ultimately was seen in the ED and discharged after seeing improvement on heparin and no intervention was initially performed.  On follow-up in the clinic he had worsening symptoms and had percutaneous mechanical thrombectomy of the left lower extremity with balloon angioplasty of the left femoral-popliteal vein on 03/30/2020.  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  There was no thrombus in the iliac or IVC.  No evidence of May Thurner.  This DVT was thought to be provoked from a long drive (>5 hours) after further evaluation with hem-onc.  He has unknown family history as he is adopted.  He was initially put on Xarelto but was transitioned to Eliquis due to bleeding gums.   He subsequently presented to the ER at the end of July where he had a CT of the abdomen and pelvis with contrast that showed extensive DVT of profunda femoral vein to the junction of the common iliac veins and likely into the inferior aspect of the IVC. He has was started on IV heparin.  Ultimately we attempted another intervention via popliteal access but could not get across a left common femoral vein occlusion with a large pelvic collateral.  Ultimately was able to access the common femoral vein with him supine and much more proximal near the iliac vein junction above the occlusion and ultimately performed lysis and then percutaneous mechanical thrombectomy and venoplasty.  All of this then reoccluded within a week while being therapeutic on Coumadin after further input from  hem onc.   He has been followed by Dr. Lorenso Courier again with heme-onc and placed on Lovenox which he continues to tolerate.    On further follow-up earlier this year felt his leg was doing worse.  Ultimately we took him to the hybrid OR and attempted both a pedal venous access and a right IJ access to cross his left common femoral and iliac occluded segment that was unsuccessful.  Ultimately he was discharged on Lovenox and we discussed no further endovascular options.  Follow-up today feels his left leg is doing well. He is tolerating the Lovenox shots. He is at work full-time. He is wearing thigh-high compression. He recently just had a second Noah and is very excited about that. He does elevate his legs at night with a Tempur-Pedic bed. He is also going to try to get in to [facility] to see one of their hematologists as well.  Past Medical History:  Diagnosis Date  . Allergy   . DVT (deep venous thrombosis) ([place])   . DVT (deep venous thrombosis) ([place]) 02/2020  . GERD (gastroesophageal reflux disease)     Past Surgical History:  Procedure Laterality Date  . INTRAVASCULAR ULTRASOUND/IVUS N/A 03/30/2020   Procedure: INTRAVASCULAR ULTRASOUND/IVUS;  Surgeon: Marty Heck, MD;  Location: [place] [facility];  Service: Cardiovascular;  Laterality: N/A;  . INTRAVASCULAR ULTRASOUND/IVUS N/A 06/24/2020   Procedure: Intravascular Ultrasound/IVUS;  Surgeon: Marty Heck, MD;  Location: [place] [facility];  Service: Cardiovascular;  Laterality: N/A;  . IVC VENOGRAPHY N/A  03/30/2020   Procedure: IVC Venography;  Surgeon: Marty Heck, MD;  Location: [place] [facility];  Service: Cardiovascular;  Laterality: N/A;  . KNEE SURGERY Right   . LEG SURGERY     Reports history of right lower extremity tibia fracture in 2010 which was repaired surgically.  . LOWER EXTREMITY VENOGRAPHY Left 03/30/2020   Procedure: LOWER EXTREMITY VENOGRAPHY;  Surgeon: Marty Heck, MD;  Location: [facility] [facility];  Service: Cardiovascular;  Laterality: Left;  . LOWER EXTREMITY VENOGRAPHY  06/23/2020   Procedure: LOWER EXTREMITY VENOGRAPHY;  Surgeon: Marty Heck, MD;  Location: [place] [facility];  Service: Cardiovascular;;  . PERIPHERAL VASCULAR THROMBECTOMY Left 03/30/2020   Procedure: PERIPHERAL VASCULAR THROMBECTOMY;  Surgeon: Marty Heck, MD;  Location: [place] [facility];  Service: Cardiovascular;  Laterality: Left;  . PERIPHERAL VASCULAR THROMBECTOMY N/A 06/24/2020   Procedure: PERIPHERAL VASCULAR THROMBECTOMY;  Surgeon: Marty Heck, MD;  Location: [place] [facility] [place] [facility];  Service: Cardiovascular;  Laterality: N/A;  . THROMBECTOMY  03/2020  . ULTRASOUND GUIDANCE FOR VASCULAR ACCESS N/A 07/28/2020   Procedure: ULTRASOUND GUIDANCE ACCESS OF RIGHT INTERNAL JUGULAR, ULTRASOUND GUIDANCE ACCESS OF POSTERIOR TIBIAL VEIN;  Surgeon: Waynetta Sandy, MD;  Location: [place];  Service: Vascular;  Laterality: N/A;  . VENOGRAM Left 07/28/2020   Procedure: CENTRAL VENOGRAM AND LEFT LOWER EXTREMITY VENOGRAM;  Surgeon: Waynetta Sandy, MD;  Location: [place];  Service: Vascular;  Laterality: Left;  . WISDOM TOOTH EXTRACTION      Family History  Adopted: Yes  Problem Relation Age of Onset  . Venous thrombosis Neg Hx     SOCIAL HISTORY: Social History   Tobacco Use  . Smoking status: Former Smoker    Types: E-cigarettes, Cigarettes  . Smokeless tobacco: Never Used  Substance Use Topics  . Alcohol use: Not Currently    No Known Allergies  Current Outpatient Medications  Medication Sig Dispense Refill  . cetirizine (ZYRTEC ALLERGY) 10 MG tablet Take 1 tablet (10 mg total) by mouth daily. 30 tablet 0  . diphenhydrAMINE (BENADRYL) 50 MG tablet Take 1 tablet (50 mg total) by mouth at bedtime as needed for itching. 30 tablet 0  . enoxaparin (LOVENOX) 150 MG/ML injection INJECT 0.86 MLS (130 MG TOTAL) INTO THE SKIN 2 (TWO) TIMES DAILY. 103.2 mL 0  .  HYDROcodone-acetaminophen (NORCO/VICODIN) 5-325 MG tablet Take 1 tablet by mouth every 6 (six) hours as needed for moderate pain. (Patient not taking: Reported on 08/19/2020) 15 tablet 0   No current facility-administered medications for this visit.    REVIEW OF SYSTEMS:  [X]  denotes positive finding, [ ]  denotes negative finding Cardiac  Comments:  Chest pain or chest pressure:    Shortness of breath upon exertion:    Short of breath when lying flat:    Irregular heart rhythm:        Vascular    Pain in calf, thigh, or hip brought on by ambulation:    Pain in feet at night that wakes you up from your sleep:     Blood clot in your veins:    Leg swelling:  x Left - mild      Pulmonary    Oxygen at home:    Productive cough:     Wheezing:         Neurologic    Sudden weakness in arms or legs:     Sudden numbness in arms or legs:     Sudden onset  of difficulty speaking or slurred speech:    Temporary loss of vision in one eye:     Problems with dizziness:         Gastrointestinal    Blood in stool:     Vomited blood:         Genitourinary    Burning when urinating:     Blood in urine:        Psychiatric    Major depression:         Hematologic    Bleeding problems:    Problems with blood clotting too easily:        Skin    Rashes or ulcers:        Constitutional    Fever or chills:      PHYSICAL EXAM: Vitals:   10/04/20 0957  BP: 118/80  Pulse: 88  Resp: 18  Temp: 98.7 F (37.1 C)  TempSrc: Temporal  SpO2: 97%  Weight: 293 lb (132.9 kg)  Height: 6\' 4"  (1.93 m)    GENERAL: The patient is a well-nourished male, in no acute distress. The vital signs are documented above. CARDIAC: There is a regular rate and rhythm.  VASCULAR:  Left leg looks almost the same as the right leg today. Left DP palpable  DATA:   No new studies today.  Assessment/Plan:  29 year old male presents for continued follow-up of extensive left leg DVT.   He has had multiple  percutaneous endovascular interventions as documented in the HPI above.  Most recently attempted both pedal venous access and right IJ access to cross his occluded left common femoral and iliac vein segment that was unsuccessful with the help of my partner Dr. Donzetta Matters.   I previously discussed with him that he has no additional endovascular options from my standpoint. Fortunately his left leg has continued to improve with conservative management and he is also tolerating the full dose Lovenox. Any future intervention would likely be an open venous bypass as a last resort which I have discouraged given poor durability. His leg actually looks as good as I have seen it today.  I will defer to Dr. Lorenso Courier if he feels like he can be transitioned from Lovenox to some other oral agent in the future given he has been on Coumadin, Eliquis, and Xarelto in the past with clot formation.    I will see him back in 1 year to monitor his clinical progress. He knows to call with questions or concerns.  Marty Heck, MD [facility] Office: [phone]

## 2020-10-14 IMAGING — DX DG CHEST 2V
2 series · 2 of 2 positions shown · non-contrast
Comparison: None

CLINICAL DATA: 29-year-old male with left lower extremity pain.
Recent blood clot.

EXAM:
CHEST - 2 VIEW

[chest pa]
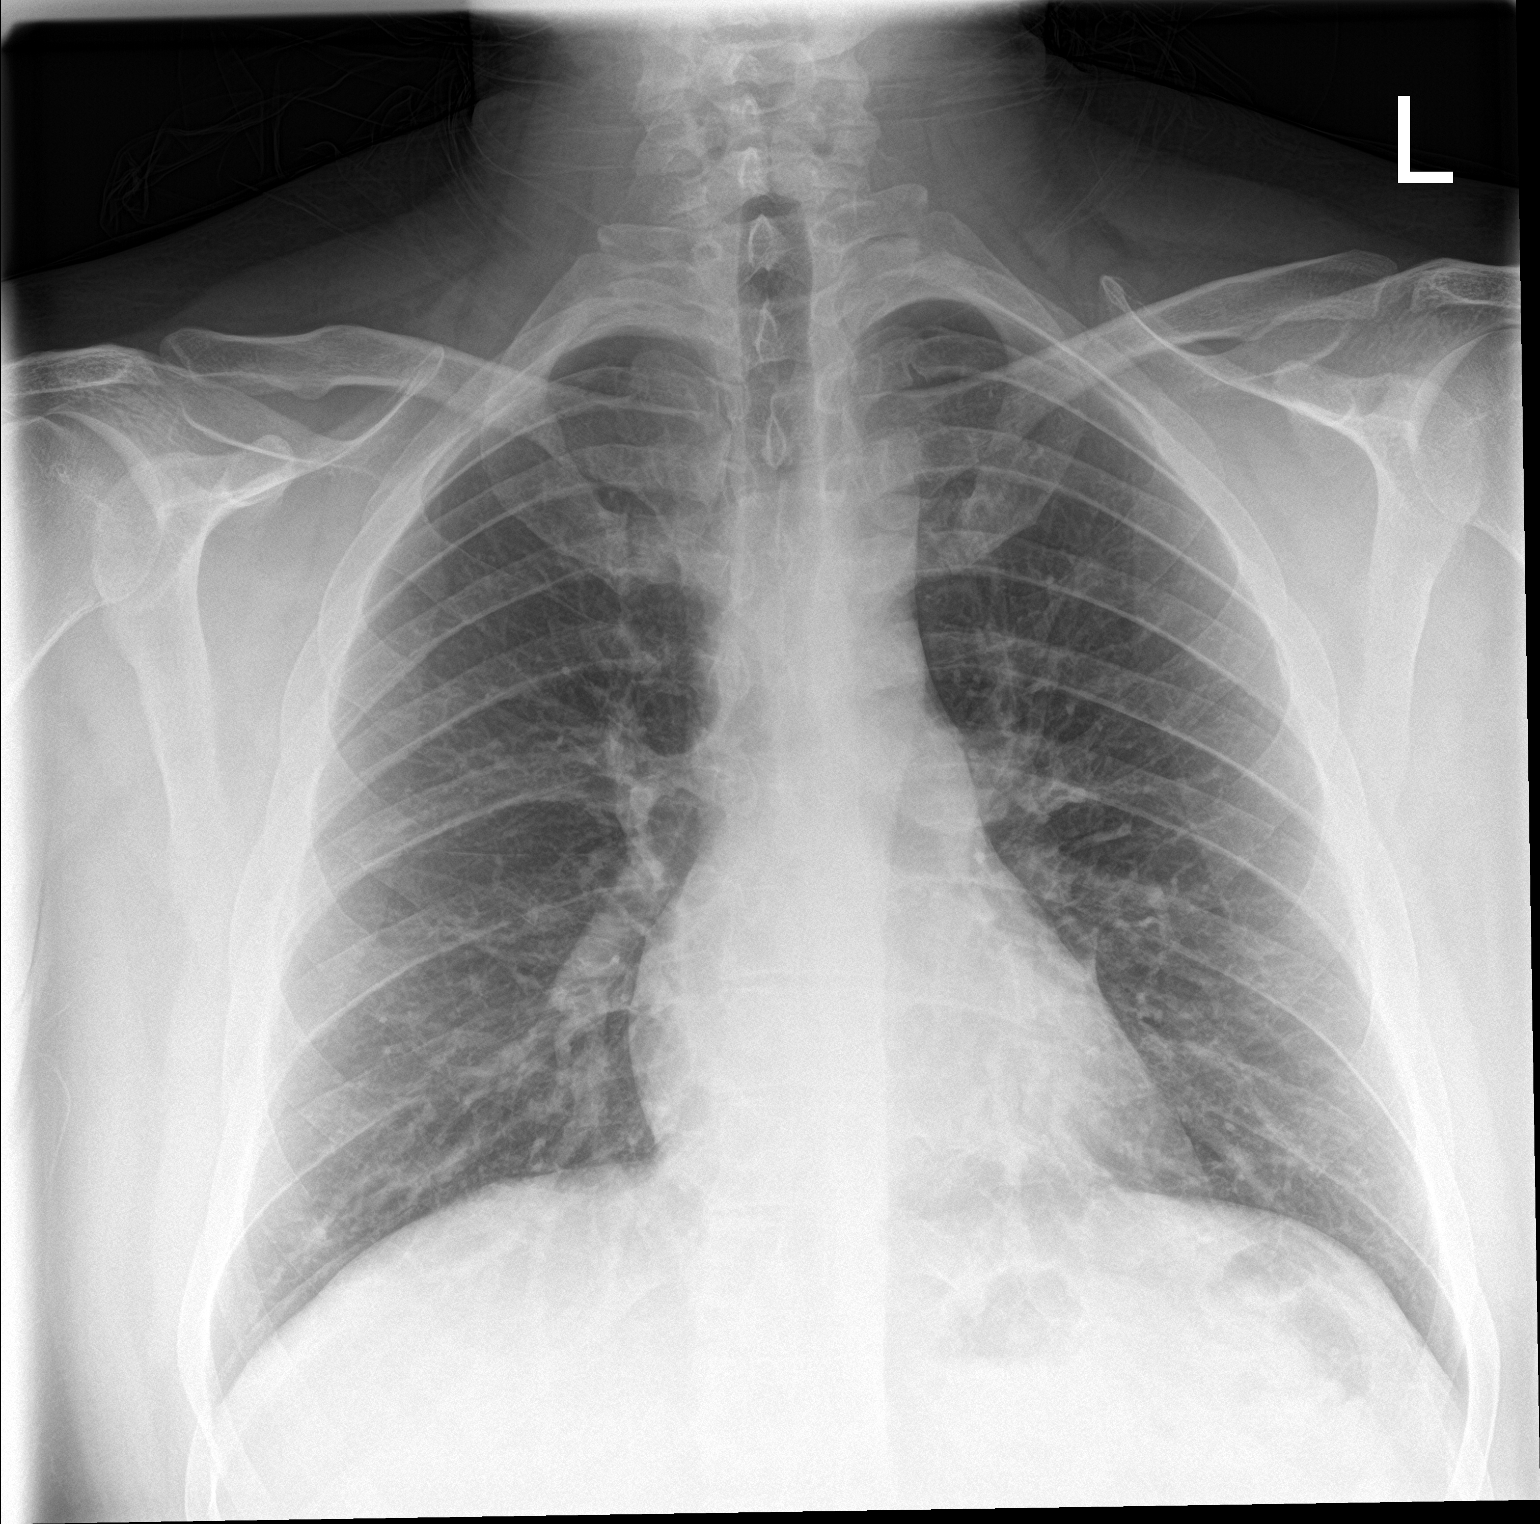

[chest lat]
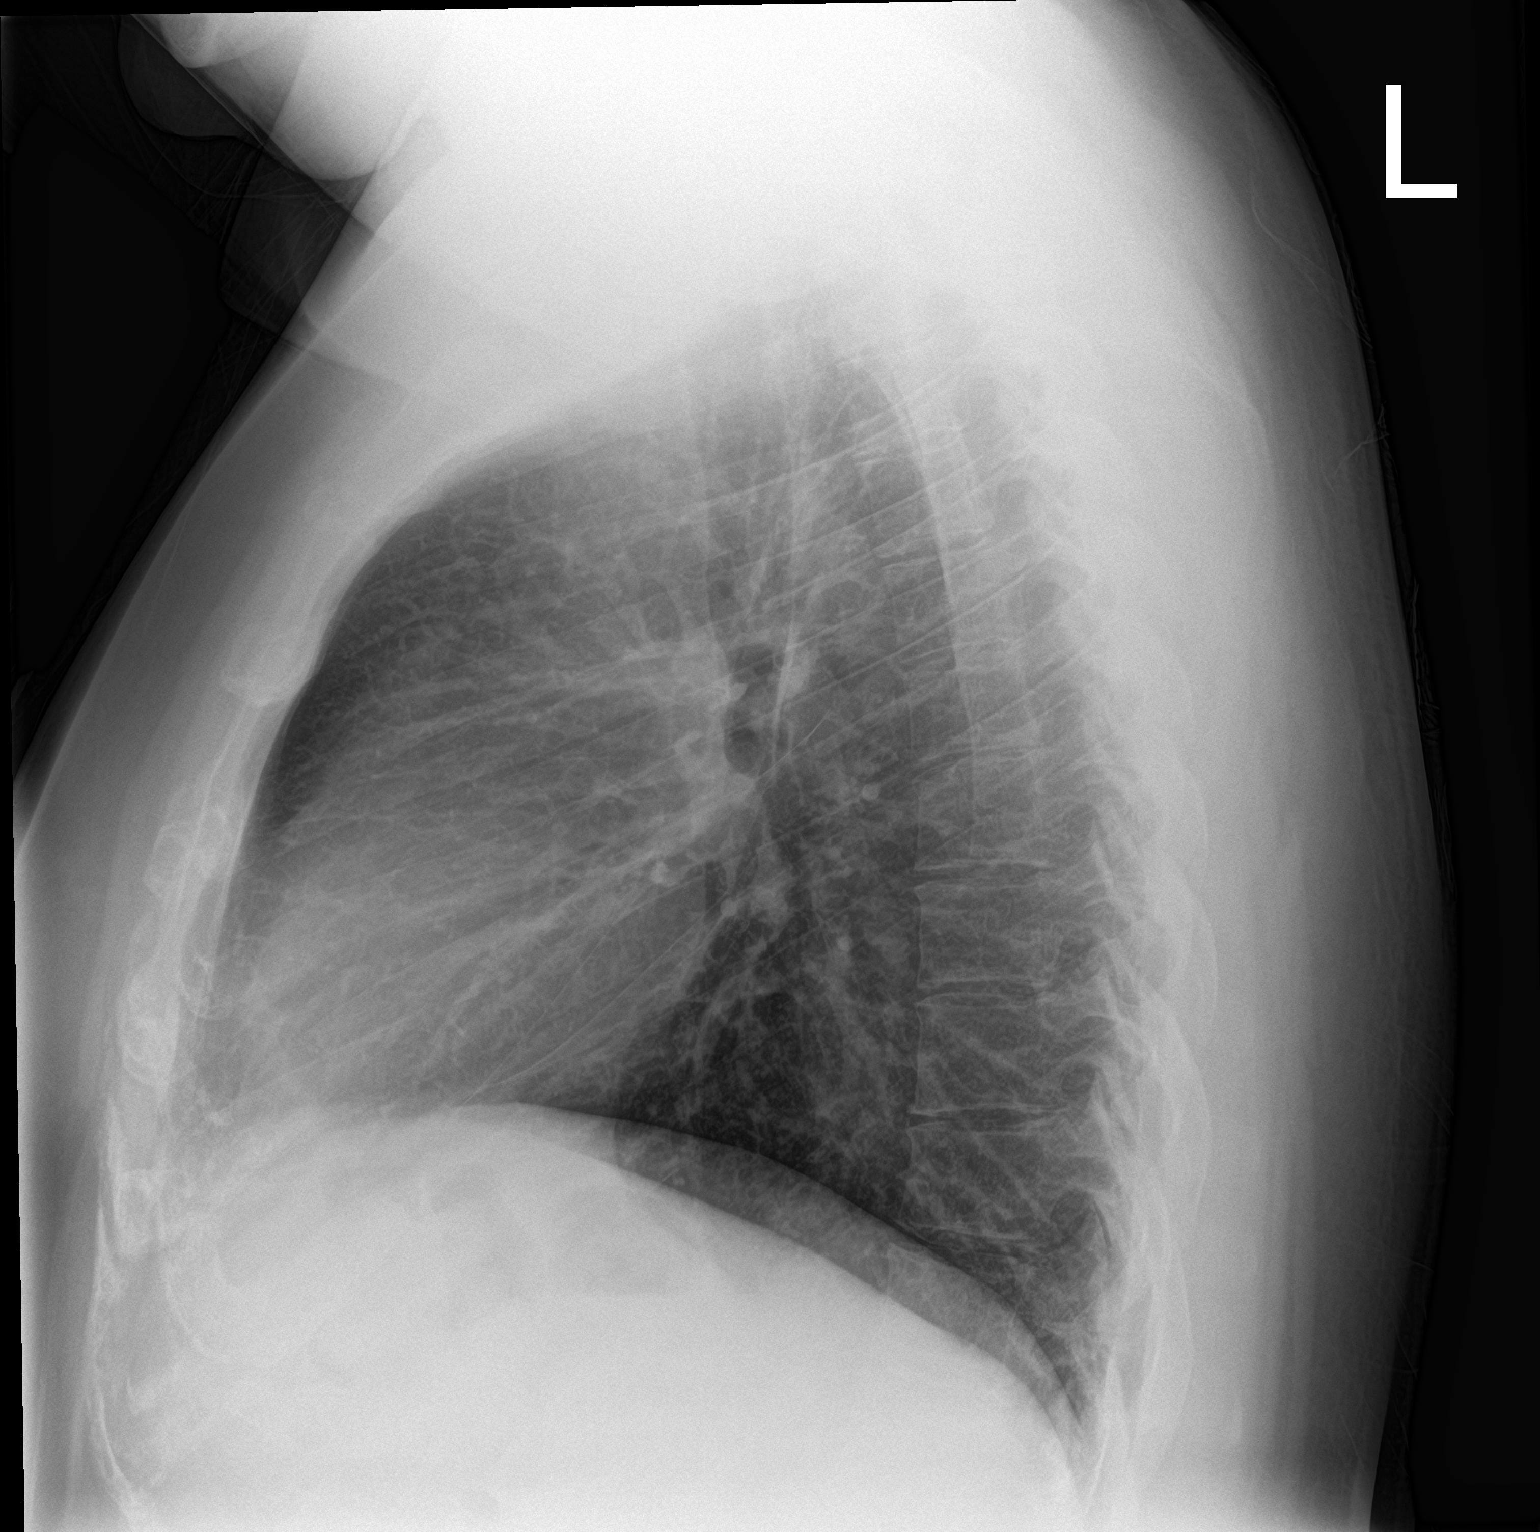

[2 of 2 positions shown; findings below may reference images not displayed]

FINDINGS: The heart size and mediastinal contours are within normal limits.
Both lungs are clear. The visualized skeletal structures are
unremarkable.
IMPRESSION: No active cardiopulmonary disease.

## 2020-10-17 ENCOUNTER — Other Ambulatory Visit: Payer: Self-pay | Admitting: Hematology and Oncology

## 2020-10-17 ENCOUNTER — Inpatient Hospital Stay: Payer: No Typology Code available for payment source | Attending: Hematology and Oncology

## 2020-10-17 ENCOUNTER — Other Ambulatory Visit: Payer: Self-pay

## 2020-10-17 ENCOUNTER — Inpatient Hospital Stay: Payer: No Typology Code available for payment source | Admitting: Hematology and Oncology

## 2020-10-17 DIAGNOSIS — Z7901 Long term (current) use of anticoagulants: Secondary | ICD-10-CM | POA: Diagnosis not present

## 2020-10-17 DIAGNOSIS — D6862 Lupus anticoagulant syndrome: Secondary | ICD-10-CM | POA: Insufficient documentation

## 2020-10-17 DIAGNOSIS — I82402 Acute embolism and thrombosis of unspecified deep veins of left lower extremity: Secondary | ICD-10-CM | POA: Diagnosis not present

## 2020-10-17 DIAGNOSIS — I8290 Acute embolism and thrombosis of unspecified vein: Secondary | ICD-10-CM

## 2020-10-17 DIAGNOSIS — Z87891 Personal history of nicotine dependence: Secondary | ICD-10-CM | POA: Insufficient documentation

## 2020-10-17 LAB — CBC WITH DIFFERENTIAL (CANCER CENTER ONLY)
Abs Immature Granulocytes: 0.03 10*3/uL (ref 0.00–0.07)
Basophils Absolute: 0.1 10*3/uL (ref 0.0–0.1)
Basophils Relative: 1 %
Eosinophils Absolute: 0.1 10*3/uL (ref 0.0–0.5)
Eosinophils Relative: 1 %
HCT: 43.5 % (ref 39.0–52.0)
Hemoglobin: 13.7 g/dL (ref 13.0–17.0)
Immature Granulocytes: 0 %
Lymphocytes Relative: 29 %
Lymphs Abs: 2.1 10*3/uL (ref 0.7–4.0)
MCH: 27.4 pg (ref 26.0–34.0)
MCHC: 31.5 g/dL (ref 30.0–36.0)
MCV: 87 fL (ref 80.0–100.0)
Monocytes Absolute: 0.7 10*3/uL (ref 0.1–1.0)
Monocytes Relative: 9 %
Neutro Abs: 4.3 10*3/uL (ref 1.7–7.7)
Neutrophils Relative %: 60 %
Platelet Count: 352 10*3/uL (ref 150–400)
RBC: 5 MIL/uL (ref 4.22–5.81)
RDW: 14 % (ref 11.5–15.5)
WBC Count: 7.2 10*3/uL (ref 4.0–10.5)
nRBC: 0 % (ref 0.0–0.2)

## 2020-10-17 LAB — CMP (CANCER CENTER ONLY)
ALT: 12 U/L (ref 0–44)
AST: 11 U/L — ABNORMAL LOW (ref 15–41)
Albumin: 4.2 g/dL (ref 3.5–5.0)
Alkaline Phosphatase: 93 U/L (ref 38–126)
Anion gap: 6 (ref 5–15)
BUN: 5 mg/dL — ABNORMAL LOW (ref 6–20)
CO2: 28 mmol/L (ref 22–32)
Calcium: 9.5 mg/dL (ref 8.9–10.3)
Chloride: 105 mmol/L (ref 98–111)
Creatinine: 0.89 mg/dL (ref 0.61–1.24)
GFR, Estimated: >60 mL/min (ref >60)
Glucose, Bld: 95 mg/dL (ref 70–99)
Potassium: 4.2 mmol/L (ref 3.5–5.1)
Sodium: 139 mmol/L (ref 135–145)
Total Bilirubin: 0.3 mg/dL (ref 0.3–1.2)
Total Protein: 8.4 g/dL — ABNORMAL HIGH (ref 6.5–8.1)

## 2020-10-19 ENCOUNTER — Other Ambulatory Visit: Payer: Self-pay | Admitting: Hematology and Oncology

## 2020-10-19 DIAGNOSIS — Z7901 Long term (current) use of anticoagulants: Secondary | ICD-10-CM

## 2020-10-21 MED ORDER — ENOXAPARIN SODIUM 150 MG/ML ~~LOC~~ SOLN: 130.0000 mg | Freq: Two times a day (BID) | SUBCUTANEOUS | 1 refills | Status: DC

## 2020-10-23 ENCOUNTER — Encounter: Payer: Self-pay | Admitting: Hematology and Oncology

## 2020-10-23 NOTE — Progress Notes (Signed)
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 [place] LE showed acute deep vein thrombosis involving the left common femoral vein, left femoral vein, and left popliteal vein. Started on anticoagulation therapy. Admitted 4/21-4/24/2021. D/c on Xarelto 2) 03/17/2020: CTA shows no evidence of PE 3) 03/30/2020: patient presented to ED with worsening pain. Admitted again and underwent peripheral vascular thrombectomy. Transitioned to Apixaban. 4) 04/11/2020: establish care with Dr. Lorenso Courier  5) 06/21/2020-06/26/2020: developed new VTEs requiring hospitalization and thrombolysis/mechanical thrombectomy of the left external iliac vein, common iliac vein, and IVC with vascular surgery on 06/23/2020. He was transitioned to coumadin at that time. 6) 07/04/2020: patient therapeutic on coumadin at INR 2.3, underwent US lower extremity with showed new thrombus in the distal IVC, external iliac and internal iliac veins. 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 male with medical history significant for extensive provoked LLE DVT who presents for a follow up visit. The patient's last visit was on 08/19/2020 at which time he had recently started lovenox therapy. In the interim since the last visit Mr. Macken has continued lovenox therapy without difficulty.  On exam today Mr.  Donella Stade ports he really does not like taking the Lovenox shots.  He is developing nodules in his abdomen and reports that occasionally when 1 is hit with a needle it pops and bleeds.  He reports that he is also quite tired as his wife had a baby on September 23, 2020 and has been assisting in the care of the baby.  The patient is unable to administer his own Lovenox shots due to a phobia of needles and has to have his wife administer the shots.  He believes that this is limiting him in his lifestyle.  He is eager to get off Lovenox or transition to another anticoagulant therapy.  On further discussion he has had no further swelling of his leg.  His itching is currently under better control when it does occur he takes Benadryl therapy.  He notes that he has not had any overt signs of bleeding, bruising, or dark stools, with the exception of the nodules in his abdomen and the bruising at the site of Lovenox injections.  He has had no further signs or symptoms that would imply an etiology for this blood clot.  His weight remains stable and he is having no other rheumatological symptoms at this time.  A full 10 point ROS is listed below.  MEDICAL HISTORY:  Past Medical History:  Diagnosis Date  . Allergy   . DVT (deep venous thrombosis) ([place])   . DVT (deep venous thrombosis) ([place]) 02/2020  . GERD (gastroesophageal reflux disease)     SURGICAL HISTORY: Past Surgical History:  Procedure Laterality Date  . INTRAVASCULAR ULTRASOUND/IVUS N/A 03/30/2020   Procedure: INTRAVASCULAR ULTRASOUND/IVUS;  Surgeon: Marty Heck, MD;  Location: [place] [facility];  Service: Cardiovascular;  Laterality: N/A;  . INTRAVASCULAR ULTRASOUND/IVUS N/A 06/24/2020   Procedure: Intravascular Ultrasound/IVUS;  Surgeon: Carlis Abbott,  Gwenyth Allegra, MD;  Location: [place] [facility];  Service: Cardiovascular;  Laterality: N/A;  . IVC VENOGRAPHY N/A 03/30/2020   Procedure: IVC Venography;  Surgeon: Marty Heck, MD;  Location:  [place] [facility];  Service: Cardiovascular;  Laterality: N/A;  . KNEE SURGERY Right   . LEG SURGERY     Reports history of right lower extremity tibia fracture in 2010 which was repaired surgically.  . LOWER EXTREMITY VENOGRAPHY Left 03/30/2020   Procedure: LOWER EXTREMITY VENOGRAPHY;  Surgeon: Marty Heck, MD;  Location: [place] [facility];  Service: Cardiovascular;  Laterality: Left;  . LOWER EXTREMITY VENOGRAPHY  06/23/2020   Procedure: LOWER EXTREMITY VENOGRAPHY;  Surgeon: Marty Heck, MD;  Location: [place] [facility];  Service: Cardiovascular;;  . PERIPHERAL VASCULAR THROMBECTOMY Left 03/30/2020   Procedure: PERIPHERAL VASCULAR THROMBECTOMY;  Surgeon: Marty Heck, MD;  Location: [place] [facility];  Service: Cardiovascular;  Laterality: Left;  . PERIPHERAL VASCULAR THROMBECTOMY N/A 06/24/2020   Procedure: PERIPHERAL VASCULAR THROMBECTOMY;  Surgeon: Marty Heck, MD;  Location: [place] [facility];  Service: Cardiovascular;  Laterality: N/A;  . THROMBECTOMY  03/2020  . ULTRASOUND GUIDANCE FOR VASCULAR ACCESS N/A 07/28/2020   Procedure: ULTRASOUND GUIDANCE ACCESS OF RIGHT INTERNAL JUGULAR, ULTRASOUND GUIDANCE ACCESS OF POSTERIOR TIBIAL VEIN;  Surgeon: Waynetta Sandy, MD;  Location: [place];  Service: Vascular;  Laterality: N/A;  . VENOGRAM Left 07/28/2020   Procedure: CENTRAL VENOGRAM AND LEFT LOWER EXTREMITY VENOGRAM;  Surgeon: Waynetta Sandy, MD;  Location: [place];  Service: Vascular;  Laterality: Left;  . WISDOM TOOTH EXTRACTION      SOCIAL HISTORY: Social History   Socioeconomic History  . Marital status: Single    Spouse name: Not on file  . Number of children: Not on file  . Years of education: Not on file  . Highest education level: Not on file  Occupational History  . Not on file  Tobacco Use  . Smoking status: Former Smoker    Types: E-cigarettes, Cigarettes  . Smokeless tobacco: Never Used  Vaping Use  . Vaping Use: Former   Substance and Sexual Activity  . Alcohol use: Not Currently  . Drug use: Not Currently  . Sexual activity: Yes    Birth control/protection: None  Other Topics Concern  . Not on file  Social History Narrative   ** Merged History Encounter **       Social Determinants of Health   Financial Resource Strain:   . Difficulty of Paying Living Expenses: Not on file  Food Insecurity:   . Worried About Charity fundraiser in the Last Year: Not on file  . Ran Out of Food in the Last Year: Not on file  Transportation Needs:   . Lack of Transportation (Medical): Not on file  . Lack of Transportation (Non-Medical): Not on file  Physical Activity:   . Days of Exercise per Week: Not on file  . Minutes of Exercise per Session: Not on file  Stress:   . Feeling of Stress : Not on file  Social Connections:   . Frequency of Communication with Friends and Family: Not on file  . Frequency of Social Gatherings with Friends and Family: Not on file  . Attends Religious Services: Not on file  . Active Member of Clubs or Organizations: Not on file  . Attends Archivist Meetings: Not on file  . Marital Status: Not on file  Intimate Partner Violence:   .  Fear of Current or Ex-Partner: Not on file  . Emotionally Abused: Not on file  . Physically Abused: Not on file  . Sexually Abused: Not on file    FAMILY HISTORY: Family History  Adopted: Yes  Problem Relation Age of Onset  . Venous thrombosis Neg Hx     ALLERGIES:  has No Known Allergies.  MEDICATIONS:  Current Outpatient Medications  Medication Sig Dispense Refill  . cetirizine (ZYRTEC ALLERGY) 10 MG tablet Take 1 tablet (10 mg total) by mouth daily. 30 tablet 0  . diphenhydrAMINE (BENADRYL) 50 MG tablet Take 1 tablet (50 mg total) by mouth at bedtime as needed for itching. 30 tablet 0  . enoxaparin (LOVENOX) 150 MG/ML injection Inject 0.86 mLs (130 mg total) into the skin 2 (two) times daily. 154.8 mL 1   No current  facility-administered medications for this visit.    REVIEW OF SYSTEMS:   Constitutional: ( - ) fevers, ( - )  chills , ( - ) night sweats Eyes: ( - ) blurriness of vision, ( - ) double vision, ( - ) watery eyes Ears, nose, mouth, throat, and face: ( - ) mucositis, ( - ) sore throat Respiratory: ( - ) cough, ( - ) dyspnea, ( - ) wheezes Cardiovascular: ( - ) palpitation, ( - ) chest discomfort, ( - ) lower extremity swelling Gastrointestinal:  ( - ) nausea, ( - ) heartburn, ( - ) change in bowel habits Skin: ( - ) abnormal skin rashes Lymphatics: ( - ) new lymphadenopathy, ( - ) easy bruising Neurological: ( - ) numbness, ( - ) tingling, ( - ) new weaknesses Behavioral/Psych: ( - ) mood change, ( - ) new changes  All other systems were reviewed with the patient and are negative.  PHYSICAL EXAMINATION: ECOG PERFORMANCE STATUS: 0 - Asymptomatic  Vitals:   10/17/20 1115  BP: 126/85  Pulse: 73  Resp: 18  Temp: 99 F (37.2 C)  SpO2: 100%   Filed Weights   10/17/20 1115  Weight: 297 lb 6.4 oz (134.9 kg)    GENERAL: well appearing young male,  alert, no distress and comfortable SKIN: No skin lesions noted.  EYES: conjunctiva are pink and non-injected, sclera clear LUNGS: clear to auscultation and percussion with normal breathing effort HEART: regular rate & rhythm and no murmurs. Musculoskeletal: no cyanosis of digits and no clubbing. No LE edema. No erythema or tenderness of the LLE.  PSYCH: alert & oriented x 3, fluent speech NEURO: no focal motor/sensory deficits  LABORATORY DATA:  I have reviewed the data as listed CBC Latest Ref Rng & Units 10/17/2020 08/19/2020 07/28/2020  WBC 4.0 - 10.5 K/uL 7.2 8.9 8.0  Hemoglobin 13.0 - 17.0 g/dL 13.7 11.9(L) 12.7(L)  Hematocrit 39 - 52 % 43.5 38.0(L) 40.7  Platelets 150 - 400 K/uL 352 353 329    CMP Latest Ref Rng & Units 10/17/2020 08/19/2020 07/28/2020  Glucose 70 - 99 mg/dL 95 91 90  BUN 6 - 20 mg/dL 5(L) 5(L) <5(L)  Creatinine  0.61 - 1.24 mg/dL 0.89 0.89 0.80  Sodium 135 - 145 mmol/L 139 139 139  Potassium 3.5 - 5.1 mmol/L 4.2 4.1 3.7  Chloride 98 - 111 mmol/L 105 106 104  CO2 22 - 32 mmol/L 28 28 24   Calcium 8.9 - 10.3 mg/dL 9.5 9.6 9.3  Total Protein 6.5 - 8.1 g/dL 8.4(H) 8.3(H) -  Total Bilirubin 0.3 - 1.2 mg/dL 0.3 0.4 -  Alkaline Phos 38 - 126  U/L 93 100 -  AST 15 - 41 U/L 11(L) 12(L) -  ALT 0 - 44 U/L 12 15 -    RADIOGRAPHIC STUDIES: VAS [place] LOWER EXTREMITY VENOUS (DVT)  Result Date: 10/04/2020  Lower Venous DVT Study Indications: Evaluation of left leg due to history of DVT.  Risk Factors: DVT Left. Thrombectomy of left external iliac vein, common iliac vein and distal IVC 06/24/2020 Anticoagulation: Xarelto. Performing Technologist: Ronal Fear RVS, RCS  Examination Guidelines: A complete evaluation includes B-mode imaging, spectral Doppler, color Doppler, and power Doppler as needed of all accessible portions of each vessel. Bilateral testing is considered an integral part of a complete examination. Limited examinations for reoccurring indications may be performed as noted. The reflux portion of the exam is performed with the patient in reverse Trendelenburg.  +---------+---------------+---------+-----------+----------+--------------+ LEFT     CompressibilityPhasicitySpontaneityPropertiesThrombus Aging +---------+---------------+---------+-----------+----------+--------------+ CFV      Partial        No                            Chronic        +---------+---------------+---------+-----------+----------+--------------+ FV Prox  Partial        No                            Chronic        +---------+---------------+---------+-----------+----------+--------------+ FV Mid   Partial        No                            Chronic        +---------+---------------+---------+-----------+----------+--------------+ FV DistalPartial        No                            Chronic         +---------+---------------+---------+-----------+----------+--------------+ POP      Partial        No                            Chronic        +---------+---------------+---------+-----------+----------+--------------+ PTV      Partial                                      Chronic        +---------+---------------+---------+-----------+----------+--------------+ GSV      Full                                                        +---------+---------------+---------+-----------+----------+--------------+     Summary: LEFT: - 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 Peroneal veins were not adequately visualized.  *See table(s) above for measurements and observations. Electronically signed by Monica Martinez MD on 10/04/2020 at 1:10:12 PM.    Final     ASSESSMENT & PLAN Manley Mason 29 y.o. male with medical history significant for extensive provoked LLE DVT who presents for a follow up visit.  After review the labs, discussion with the  patient, and review of the prior records the patient's findings are most consistent with recurrent VTEs of unclear etiology.  On exam today [name] is doing well overall from a thrombosis standpoint.  He is not having any further swelling or discomfort in his lower extremities.  He does however have issues with taking the Lovenox including bruising, nodules, and the inability to administer the shots himself.  He is eager for an alternative form of anticoagulation.  We discussed in detail today that he has failed therapy on Eliquis, Xarelto, and Coumadin.  It would be most appropriate for him to continue with the Lovenox, though I did note that with his referral to Dr. Dionne Milo he may be offered other suggestions for how to treat this thrombosis.  During the patient's prior hospitalization he was seen by Dr. Monica Martinez with vascular surgery who performed  thrombolysis and percutaneous mechanical thrombectomy of the left external iliac, common iliac, distal IVC on 06/24/2020.  Due to concern the fact that the occurred while on anticoagulation therapy with Eliquis he was transitioned to Coumadin therapy at time of discharge and is subsequently established with Coumadin clinic in the outpatient setting.  After d/c of Lovenox on Saturday the patient began to have worsening leg swelling and developed tender erythematous raised lesions. These have subsequently resolved. 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 Patient appears stable on lovenox at this time.  --prior hypercoagulation/inflammatory workups were unrevealing. --will also attempt to r/o malignancy with a testicular US (prior imaging of Chest (02/2020) and abdomen/pelvis (05/2020) showed no evidence of disease)  --if no clear answer can be reached will need to make referral to tertiary care center for hematological evaluation. Would recommend [facility]  --vascular surgery evaluated for a  compressive syndrome (such as May Thurner). No evidence of this was found. Appreciate their assistance in this case.  --RTC in 3 months time  No orders of the defined types were placed in this encounter.  All questions were answered. The patient knows to call the clinic with any problems, questions or concerns.  A total of more than 30 minutes were spent on this encounter and over half of that time was spent on counseling and coordination of care as outlined above.   Ledell Peoples, MD Department of Hematology/Oncology [place] [facility] Phone: [phone] Pager: [phone] Email: [email or web address]  10/23/2020 6:43 PM

## 2020-10-24 ENCOUNTER — Telehealth: Payer: Self-pay | Admitting: Hematology and Oncology

## 2020-10-24 NOTE — Telephone Encounter (Signed)
Scheduled appt per 11/28 sch msg - pt is aware of appt date and time

## 2020-12-19 ENCOUNTER — Telehealth: Payer: Self-pay | Admitting: Hematology and Oncology

## 2020-12-19 NOTE — Telephone Encounter (Signed)
Left message with rescheduled appointment. Gave option to call back to reschedule if needed. 

## 2021-01-16 ENCOUNTER — Other Ambulatory Visit: Payer: No Typology Code available for payment source

## 2021-01-16 ENCOUNTER — Ambulatory Visit: Payer: No Typology Code available for payment source | Admitting: Hematology and Oncology

## 2021-01-23 ENCOUNTER — Other Ambulatory Visit: Payer: Self-pay

## 2021-01-23 ENCOUNTER — Other Ambulatory Visit: Payer: Self-pay | Admitting: Hematology and Oncology

## 2021-01-23 ENCOUNTER — Inpatient Hospital Stay: Payer: No Typology Code available for payment source | Attending: Hematology and Oncology

## 2021-01-23 ENCOUNTER — Inpatient Hospital Stay (HOSPITAL_BASED_OUTPATIENT_CLINIC_OR_DEPARTMENT_OTHER): Payer: No Typology Code available for payment source | Admitting: Hematology and Oncology

## 2021-01-23 DIAGNOSIS — Z87891 Personal history of nicotine dependence: Secondary | ICD-10-CM | POA: Diagnosis not present

## 2021-01-23 DIAGNOSIS — Z7901 Long term (current) use of anticoagulants: Secondary | ICD-10-CM | POA: Diagnosis not present

## 2021-01-23 DIAGNOSIS — Z79899 Other long term (current) drug therapy: Secondary | ICD-10-CM | POA: Diagnosis not present

## 2021-01-23 DIAGNOSIS — R21 Rash and other nonspecific skin eruption: Secondary | ICD-10-CM | POA: Insufficient documentation

## 2021-01-23 DIAGNOSIS — M7989 Other specified soft tissue disorders: Secondary | ICD-10-CM | POA: Diagnosis not present

## 2021-01-23 DIAGNOSIS — Z86718 Personal history of other venous thrombosis and embolism: Secondary | ICD-10-CM | POA: Insufficient documentation

## 2021-01-23 DIAGNOSIS — D6862 Lupus anticoagulant syndrome: Secondary | ICD-10-CM | POA: Insufficient documentation

## 2021-01-23 DIAGNOSIS — I8290 Acute embolism and thrombosis of unspecified vein: Secondary | ICD-10-CM

## 2021-01-23 LAB — CMP (CANCER CENTER ONLY)
ALT: 21 U/L (ref 0–44)
AST: 16 U/L (ref 15–41)
Albumin: 4.5 g/dL (ref 3.5–5.0)
Alkaline Phosphatase: 88 U/L (ref 38–126)
Anion gap: 11 (ref 5–15)
BUN: 8 mg/dL (ref 6–20)
CO2: 25 mmol/L (ref 22–32)
Calcium: 9.6 mg/dL (ref 8.9–10.3)
Chloride: 104 mmol/L (ref 98–111)
Creatinine: 1.08 mg/dL (ref 0.61–1.24)
GFR, Estimated: >60 mL/min (ref >60)
Glucose, Bld: 96 mg/dL (ref 70–99)
Potassium: 4.5 mmol/L (ref 3.5–5.1)
Sodium: 140 mmol/L (ref 135–145)
Total Bilirubin: 0.4 mg/dL (ref 0.3–1.2)
Total Protein: 8.5 g/dL — ABNORMAL HIGH (ref 6.5–8.1)

## 2021-01-23 LAB — CBC WITH DIFFERENTIAL (CANCER CENTER ONLY)
Abs Immature Granulocytes: 0.05 10*3/uL (ref 0.00–0.07)
Basophils Absolute: 0.1 10*3/uL (ref 0.0–0.1)
Basophils Relative: 1 %
Eosinophils Absolute: 0.1 10*3/uL (ref 0.0–0.5)
Eosinophils Relative: 1 %
HCT: 45.4 % (ref 39.0–52.0)
Hemoglobin: 14.8 g/dL (ref 13.0–17.0)
Immature Granulocytes: 1 %
Lymphocytes Relative: 33 %
Lymphs Abs: 2.9 10*3/uL (ref 0.7–4.0)
MCH: 28.2 pg (ref 26.0–34.0)
MCHC: 32.6 g/dL (ref 30.0–36.0)
MCV: 86.6 fL (ref 80.0–100.0)
Monocytes Absolute: 0.6 10*3/uL (ref 0.1–1.0)
Monocytes Relative: 7 %
Neutro Abs: 5 10*3/uL (ref 1.7–7.7)
Neutrophils Relative %: 57 %
Platelet Count: 317 10*3/uL (ref 150–400)
RBC: 5.24 MIL/uL (ref 4.22–5.81)
RDW: 16.2 % — ABNORMAL HIGH (ref 11.5–15.5)
WBC Count: 8.6 10*3/uL (ref 4.0–10.5)
nRBC: 0 % (ref 0.0–0.2)

## 2021-01-23 MED ORDER — ENOXAPARIN SODIUM 150 MG/ML ~~LOC~~ SOLN: 130.0000 mg | Freq: Two times a day (BID) | SUBCUTANEOUS | 1 refills | Status: DC

## 2021-01-24 NOTE — Progress Notes (Signed)
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 [place] LE showed acute deep vein thrombosis involving the left common femoral vein, left femoral vein, and left popliteal vein. Started on anticoagulation therapy. Admitted 4/21-4/24/2021. D/c on Xarelto 2) 03/17/2020: CTA shows no evidence of PE 3) 03/30/2020: patient presented to ED with worsening pain. Admitted again and underwent peripheral vascular thrombectomy. Transitioned to Apixaban. 4) 04/11/2020: establish care with Dr. Lorenso Courier  5) 06/21/2020-06/26/2020: developed new VTEs requiring hospitalization and thrombolysis/mechanical thrombectomy of the left external iliac vein, common iliac vein, and IVC with vascular surgery on 06/23/2020. He was transitioned to coumadin at that time. 6) 07/04/2020: patient therapeutic on coumadin at INR 2.3, underwent US lower extremity with showed new thrombus in the distal IVC, external iliac and internal iliac veins. 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 male with medical history significant for extensive provoked LLE DVT who presents for a follow up visit. The patient's last visit was on 10/17/2020 at which time he had recently started lovenox therapy. In the interim since the last visit Noah Moore has continued lovenox therapy without difficulty.  On exam today Noah Moore reports he has continued taking the lovenox shots but has a strong desire to discontinue taking them.  His new baby girl is approximately 76 months old and she is happy and healthy.  His wife is off maternity leave.  He reports he does occasionally have symptoms of chest flutters which occur with excessive activity.  He notes that these are not associated with any shortness of breath or chest pain.  He also has developed a small rash on his ankle on his left leg.  He notes that his bowel movements are not dark and contain no blood but are typically runny.  On further discussion he has had no further swelling of his leg.  He continues to have episodes of itching which is well controlled with Benadryl therapy.  He notes that he has not had any overt signs of bleeding, bruising, or dark stools, with the exception of the nodules in his abdomen and the bruising at the site of Lovenox injections.  He has had no further signs or symptoms that would imply an etiology for this blood clot.  His weight has been increasing and he is having no other rheumatological symptoms at this time.  A full 10 point ROS is listed below.  MEDICAL HISTORY:  Past Medical History:  Diagnosis Date  . Allergy   . DVT (deep venous thrombosis) ([place])   . DVT (deep venous thrombosis) ([address]) 02/2020  . GERD (gastroesophageal reflux disease)     SURGICAL HISTORY: Past Surgical History:  Procedure Laterality Date  . INTRAVASCULAR ULTRASOUND/IVUS N/A 03/30/2020   Procedure: INTRAVASCULAR ULTRASOUND/IVUS;  Surgeon: Marty Heck, MD;  Location: [place] CV LAB;  Service: Cardiovascular;  Laterality: N/A;  . INTRAVASCULAR ULTRASOUND/IVUS N/A 06/24/2020   Procedure: Intravascular Ultrasound/IVUS;  Surgeon: Marty Heck, MD;  Location: [facility] INVASIVE CV  LAB;  Service: Cardiovascular;  Laterality: N/A;  . IVC VENOGRAPHY N/A 03/30/2020   Procedure: IVC Venography;  Surgeon: Marty Heck, MD;  Location: [place]  CV LAB;  Service: Cardiovascular;  Laterality: N/A;  . KNEE SURGERY Right   . LEG SURGERY     Reports history of right lower extremity tibia fracture in 2010 which was repaired surgically.  . LOWER EXTREMITY VENOGRAPHY Left 03/30/2020   Procedure: LOWER EXTREMITY VENOGRAPHY;  Surgeon: Marty Heck, MD;  Location: [place] CV LAB;  Service: Cardiovascular;  Laterality: Left;  . LOWER EXTREMITY VENOGRAPHY  06/23/2020   Procedure: LOWER EXTREMITY VENOGRAPHY;  Surgeon: Marty Heck, MD;  Location: [place] CV LAB;  Service: Cardiovascular;;  . PERIPHERAL VASCULAR THROMBECTOMY Left 03/30/2020   Procedure: PERIPHERAL VASCULAR THROMBECTOMY;  Surgeon: Marty Heck, MD;  Location: [place] CV LAB;  Service: Cardiovascular;  Laterality: Left;  . PERIPHERAL VASCULAR THROMBECTOMY N/A 06/24/2020   Procedure: PERIPHERAL VASCULAR THROMBECTOMY;  Surgeon: Marty Heck, MD;  Location: [place] CV LAB;  Service: Cardiovascular;  Laterality: N/A;  . THROMBECTOMY  03/2020  . ULTRASOUND GUIDANCE FOR VASCULAR ACCESS N/A 07/28/2020   Procedure: ULTRASOUND GUIDANCE ACCESS OF RIGHT INTERNAL JUGULAR, ULTRASOUND GUIDANCE ACCESS OF POSTERIOR TIBIAL VEIN;  Surgeon: Waynetta Sandy, MD;  Location: [place];  Service: Vascular;  Laterality: N/A;  . VENOGRAM Left 07/28/2020   Procedure: CENTRAL VENOGRAM AND LEFT LOWER EXTREMITY VENOGRAM;  Surgeon: Waynetta Sandy, MD;  Location: [place];  Service: Vascular;  Laterality: Left;  . WISDOM TOOTH EXTRACTION      SOCIAL HISTORY: Social History   Socioeconomic History  . Marital status: Single    Spouse name: Not on file  . Number of children: Not on file  . Years of education: Not on file  . Highest education level: Not on file  Occupational History  . Not on file  Tobacco Use  . Smoking status: Former Smoker    Types: E-cigarettes, Cigarettes  . Smokeless tobacco: Never Used  Vaping Use  . Vaping Use: Former  Substance  and Sexual Activity  . Alcohol use: Not Currently  . Drug use: Not Currently  . Sexual activity: Yes    Birth control/protection: None  Other Topics Concern  . Not on file  Social History Narrative   ** Merged History Encounter **       Social Determinants of Health   Financial Resource Strain: Not on file  Food Insecurity: Not on file  Transportation Needs: Not on file  Physical Activity: Not on file  Stress: Not on file  Social Connections: Not on file  Intimate Partner Violence: Not on file    FAMILY HISTORY: Family History  Adopted: Yes  Problem Relation Age of Onset  . Venous thrombosis Neg Hx     ALLERGIES:  has No Known Allergies.  MEDICATIONS:  Current Outpatient Medications  Medication Sig Dispense Refill  . cetirizine (ZYRTEC ALLERGY) 10 MG tablet Take 1 tablet (10 mg total) by mouth daily. 30 tablet 0  . cetirizine (ZYRTEC) 10 MG tablet     . diphenhydrAMINE (BENADRYL) 50 MG tablet Take 1 tablet (50 mg total) by mouth at bedtime as needed for itching. 30 tablet 0  . enoxaparin (LOVENOX) 150 MG/ML injection Inject 0.86 mLs (130 mg total) into the skin 2 (two) times daily. 154.8 mL 1   No current facility-administered medications for this visit.    REVIEW OF SYSTEMS:   Constitutional: ( - )  fevers, ( - )  chills , ( - ) night sweats Eyes: ( - ) blurriness of vision, ( - ) double vision, ( - ) watery eyes Ears, nose, mouth, throat, and face: ( - ) mucositis, ( - ) sore throat Respiratory: ( - ) cough, ( - ) dyspnea, ( - ) wheezes Cardiovascular: ( - ) palpitation, ( - ) chest discomfort, ( - ) lower extremity swelling Gastrointestinal:  ( - ) nausea, ( - ) heartburn, ( - ) change in bowel habits Skin: ( - ) abnormal skin rashes Lymphatics: ( - ) new lymphadenopathy, ( - ) easy bruising Neurological: ( - ) numbness, ( - ) tingling, ( - ) new weaknesses Behavioral/Psych: ( - ) mood change, ( - ) new changes  All other systems were reviewed with the patient  and are negative.  PHYSICAL EXAMINATION: ECOG PERFORMANCE STATUS: 0 - Asymptomatic  Vitals:   01/23/21 1546  BP: 133/88  Pulse: 83  Resp: 18  Temp: 98.9 F (37.2 C)   Filed Weights   01/23/21 1546  Weight: (!) 314 lb 12.8 oz (142.8 kg)    GENERAL: well appearing young male,  alert, no distress and comfortable SKIN: No skin lesions noted.  EYES: conjunctiva are pink and non-injected, sclera clear LUNGS: clear to auscultation and percussion with normal breathing effort HEART: regular rate & rhythm and no murmurs. Musculoskeletal: no cyanosis of digits and no clubbing. No LE edema. No erythema or tenderness of the LLE.  PSYCH: alert & oriented x 3, fluent speech NEURO: no focal motor/sensory deficits  LABORATORY DATA:  I have reviewed the data as listed CBC Latest Ref Rng & Units 01/23/2021 10/17/2020 08/19/2020  WBC 4.0 - 10.5 K/uL 8.6 7.2 8.9  Hemoglobin 13.0 - 17.0 g/dL 14.8 13.7 11.9(L)  Hematocrit 39.0 - 52.0 % 45.4 43.5 38.0(L)  Platelets 150 - 400 K/uL 317 352 353    CMP Latest Ref Rng & Units 01/23/2021 10/17/2020 08/19/2020  Glucose 70 - 99 mg/dL 96 95 91  BUN 6 - 20 mg/dL 8 5(L) 5(L)  Creatinine 0.61 - 1.24 mg/dL 1.08 0.89 0.89  Sodium 135 - 145 mmol/L 140 139 139  Potassium 3.5 - 5.1 mmol/L 4.5 4.2 4.1  Chloride 98 - 111 mmol/L 104 105 106  CO2 22 - 32 mmol/L 25 28 28   Calcium 8.9 - 10.3 mg/dL 9.6 9.5 9.6  Total Protein 6.5 - 8.1 g/dL 8.5(H) 8.4(H) 8.3(H)  Total Bilirubin 0.3 - 1.2 mg/dL 0.4 0.3 0.4  Alkaline Phos 38 - 126 U/L 88 93 100  AST 15 - 41 U/L 16 11(L) 12(L)  ALT 0 - 44 U/L 21 12 15     RADIOGRAPHIC STUDIES: No results found.  ASSESSMENT & PLAN Noah Moore 30 y.o. male with medical history significant for extensive provoked LLE DVT who presents for a follow up visit.  After review the labs, discussion with the patient, and review of the prior records the patient's findings are most consistent with recurrent VTEs of unclear etiology.  On exam  today Noah Moore is doing well overall from a thrombosis standpoint.  He is not having any further swelling or discomfort in his lower extremities.  He does however have issues with taking the Lovenox including bruising, nodules, and the inability to administer the shots himself.  He is eager for an alternative form of anticoagulation.  We discussed in detail today that he has failed therapy on Eliquis, Xarelto, and Coumadin.  It would be most  appropriate for him to continue with the Lovenox, though I did note that with his referral to Dr. Dionne Milo he may be offered other suggestions for how to treat this thrombosis.  During the patient's prior hospitalization he was seen by Dr. Monica Martinez with vascular surgery who performed thrombolysis and percutaneous mechanical thrombectomy of the left external iliac, common iliac, distal IVC on 06/24/2020.  Due to concern the fact that the occurred while on anticoagulation therapy with Eliquis he was transitioned to Coumadin therapy at time of discharge and is subsequently established with Coumadin clinic in the outpatient setting.  After d/c of Lovenox on Saturday the patient began to have worsening leg swelling and developed tender erythematous raised lesions. These have subsequently resolved. 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 Patient appears stable on lovenox at this time.  --prior hypercoagulation/inflammatory workups were unrevealing. --will also attempt to r/o malignancy with a testicular US (prior imaging of Chest (02/2020) and abdomen/pelvis (05/2020) showed no evidence of disease)  --if no clear answer can be reached will need to make referral to tertiary care center for hematological evaluation. Would recommend [facility].  --vascular surgery evaluated for a compressive syndrome (such as May Thurner). No evidence of this was found. Appreciate their assistance in this case.  --RTC in 3 months time  No orders of the defined types were placed in this encounter.  All questions were answered. The patient knows to call the clinic with any problems, questions or concerns.  A total of more than 30 minutes were spent on this encounter and over half of that time was spent on counseling and coordination of care as outlined above.   Ledell Peoples, MD Department of Hematology/Oncology [place] [facility] Phone: [phone] Pager: [phone] Email: [email or web address]  01/24/2021 4:09 PM

## 2021-01-25 ENCOUNTER — Telehealth: Payer: Self-pay | Admitting: Hematology and Oncology

## 2021-01-25 NOTE — Telephone Encounter (Signed)
Scheduled per 2/28 los. Called pt and left a msg  

## 2021-02-03 ENCOUNTER — Encounter: Payer: Self-pay | Admitting: Adult Health

## 2021-02-03 ENCOUNTER — Telehealth (INDEPENDENT_AMBULATORY_CARE_PROVIDER_SITE_OTHER): Payer: No Typology Code available for payment source | Admitting: Adult Health

## 2021-02-03 VITALS — Ht 76.0 in | Wt 315.0 lb

## 2021-02-03 DIAGNOSIS — J302 Other seasonal allergic rhinitis: Secondary | ICD-10-CM | POA: Diagnosis not present

## 2021-02-03 MED ORDER — MONTELUKAST SODIUM 10 MG PO TABS: 10.0000 mg | ORAL_TABLET | Freq: Every day | ORAL | 1 refills | Status: DC

## 2021-02-03 NOTE — Progress Notes (Signed)
Virtual Visit via Video Note  I connected with Noah Moore on 02/03/21 at  1:00 PM EST by a video enabled telemedicine application and verified that I am speaking with the correct person using two identifiers.  Location patient: home Location provider:work or home office Persons participating in the virtual visit: patient, provider  I discussed the limitations of evaluation and management by telemedicine and the availability of in person appointments. The patient expressed understanding and agreed to proceed.   HPI: 30 year old male who is being evaluated today for seasonal allergies.  Reports that over the last 2 weeks he has been taking Zyrtec, which she has taken for multiple years but is no longer having relief.  Reports that since the warm weather has come through he has been having sneezing fits, rhinorrhea and itchy watery eyes.  He denies shortness of breath or chest pain   ROS: See pertinent positives and negatives per HPI.  Past Medical History:  Diagnosis Date  . Allergy   . DVT (deep venous thrombosis) ()   . DVT (deep venous thrombosis) ([place]) 02/2020  . GERD (gastroesophageal reflux disease)     Past Surgical History:  Procedure Laterality Date  . INTRAVASCULAR ULTRASOUND/IVUS N/A 03/30/2020   Procedure: INTRAVASCULAR ULTRASOUND/IVUS;  Surgeon: Marty Heck, MD;  Location: [place] [facility];  Service: Cardiovascular;  Laterality: N/A;  . INTRAVASCULAR ULTRASOUND/IVUS N/A 06/24/2020   Procedure: Intravascular Ultrasound/IVUS;  Surgeon: Marty Heck, MD;  Location: [place] [facility];  Service: Cardiovascular;  Laterality: N/A;  . IVC VENOGRAPHY N/A 03/30/2020   Procedure: IVC Venography;  Surgeon: Marty Heck, MD;  Location: [place] [facility];  Service: Cardiovascular;  Laterality: N/A;  . KNEE SURGERY Right   . LEG SURGERY     Reports history of right lower extremity tibia fracture in 2010 which was repaired surgically.  . LOWER  EXTREMITY VENOGRAPHY Left 03/30/2020   Procedure: LOWER EXTREMITY VENOGRAPHY;  Surgeon: Marty Heck, MD;  Location: [place] [facility];  Service: Cardiovascular;  Laterality: Left;  . LOWER EXTREMITY VENOGRAPHY  06/23/2020   Procedure: LOWER EXTREMITY VENOGRAPHY;  Surgeon: Marty Heck, MD;  Location: [place] [facility];  Service: Cardiovascular;;  . PERIPHERAL VASCULAR THROMBECTOMY Left 03/30/2020   Procedure: PERIPHERAL VASCULAR THROMBECTOMY;  Surgeon: Marty Heck, MD;  Location: [place] [facility];  Service: Cardiovascular;  Laterality: Left;  . PERIPHERAL VASCULAR THROMBECTOMY N/A 06/24/2020   Procedure: PERIPHERAL VASCULAR THROMBECTOMY;  Surgeon: Marty Heck, MD;  Location: [place] [facility];  Service: Cardiovascular;  Laterality: N/A;  . THROMBECTOMY  03/2020  . ULTRASOUND GUIDANCE FOR VASCULAR ACCESS N/A 07/28/2020   Procedure: ULTRASOUND GUIDANCE ACCESS OF RIGHT INTERNAL JUGULAR, ULTRASOUND GUIDANCE ACCESS OF POSTERIOR TIBIAL VEIN;  Surgeon: Waynetta Sandy, MD;  Location: [place];  Service: Vascular;  Laterality: N/A;  . VENOGRAM Left 07/28/2020   Procedure: CENTRAL VENOGRAM AND LEFT LOWER EXTREMITY VENOGRAM;  Surgeon: Waynetta Sandy, MD;  Location: [place];  Service: Vascular;  Laterality: Left;  . WISDOM TOOTH EXTRACTION      Family History  Adopted: Yes  Problem Relation Age of Onset  . Venous thrombosis Neg Hx        Current Outpatient Medications:  .  cetirizine (ZYRTEC ALLERGY) 10 MG tablet, Take 1 tablet (10 mg total) by mouth daily., Disp: 30 tablet, Rfl: 0 .  cetirizine (ZYRTEC) 10 MG tablet, , Disp: , Rfl:  .  enoxaparin (LOVENOX) 150 MG/ML injection, Inject 0.86 mLs (  130 mg total) into the skin 2 (two) times daily., Disp: 154.8 mL, Rfl: 1 .  montelukast (SINGULAIR) 10 MG tablet, Take 1 tablet (10 mg total) by mouth at bedtime., Disp: 90 tablet, Rfl: 1  EXAM:  VITALS per patient if applicable:  GENERAL: alert, oriented,  appears well and in no acute distress  HEENT: atraumatic, conjunttiva clear, no obvious abnormalities on inspection of external nose and ears  NECK: normal movements of the head and neck  LUNGS: on inspection no signs of respiratory distress, breathing rate appears normal, no obvious gross SOB, gasping or wheezing  CV: no obvious cyanosis  MS: moves all visible extremities without noticeable abnormality  PSYCH/NEURO: pleasant and cooperative, no obvious depression or anxiety, speech and thought processing grossly intact  ASSESSMENT AND PLAN:  Discussed the following assessment and plan:  1. Seasonal allergies  - montelukast (SINGULAIR) 10 MG tablet; Take 1 tablet (10 mg total) by mouth at bedtime.  Dispense: 90 tablet; Refill: 1  - Follow up if no improvement. Can refer to [place]     I discussed the assessment and treatment plan with the patient. The patient was provided an opportunity to ask questions and all were answered. The patient agreed with the plan and demonstrated an understanding of the instructions.   The patient was advised to call back or seek an in-person evaluation if the symptoms worsen or if the condition fails to improve as anticipated.   Dorothyann Peng, NP

## 2021-02-27 ENCOUNTER — Other Ambulatory Visit: Payer: Self-pay | Admitting: *Deleted

## 2021-02-27 ENCOUNTER — Telehealth: Payer: Self-pay | Admitting: *Deleted

## 2021-02-27 ENCOUNTER — Other Ambulatory Visit: Payer: Self-pay | Admitting: Hematology and Oncology

## 2021-02-27 DIAGNOSIS — Z7901 Long term (current) use of anticoagulants: Secondary | ICD-10-CM

## 2021-02-27 DIAGNOSIS — I8290 Acute embolism and thrombosis of unspecified vein: Secondary | ICD-10-CM

## 2021-02-27 NOTE — Telephone Encounter (Signed)
Received call from patient's wife, Verline Lema. She states her husband is not feeling as well and is very concerned.  He was in the [place] area and decided to call [facility] regarding referral to Dr. Dionne Milo.  He was told that Dr. Dionne Milo never received a referral.  Please advise

## 2021-03-01 ENCOUNTER — Telehealth: Payer: Self-pay | Admitting: *Deleted

## 2021-03-01 NOTE — Telephone Encounter (Signed)
Faxed referral to Dr Rondel Baton @ [facility] for patient.  Included last office note, demographics and labs.  Faxed to [phone]

## 2021-03-03 NOTE — Telephone Encounter (Signed)
Please review

## 2021-03-30 IMAGING — US US SCROTUM W/ DOPPLER COMPLETE
1 series · 13 of 25 positions shown · non-contrast
Comparison: None

CLINICAL DATA: Venous thromboembolism, scrotal pain

EXAM:
SCROTAL ULTRASOUND
DOPPLER ULTRASOUND OF THE TESTICLES
TECHNIQUE: Complete ultrasound examination of the testicles, epididymis, and
other scrotal structures was performed. Color and spectral Doppler
ultrasound were also utilized to evaluate blood flow to the
testicles.

[Series 1: us scrotum w/ doppler complete · 13 of 34 slices shown]
[im 1/34]
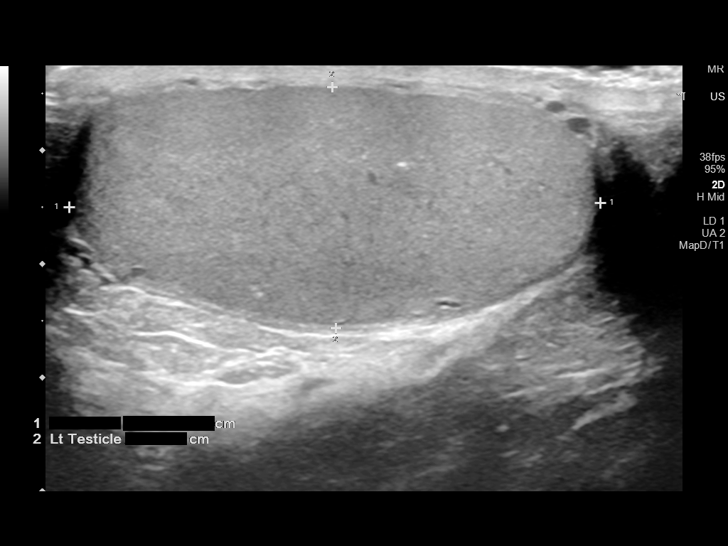
[im 3/34]
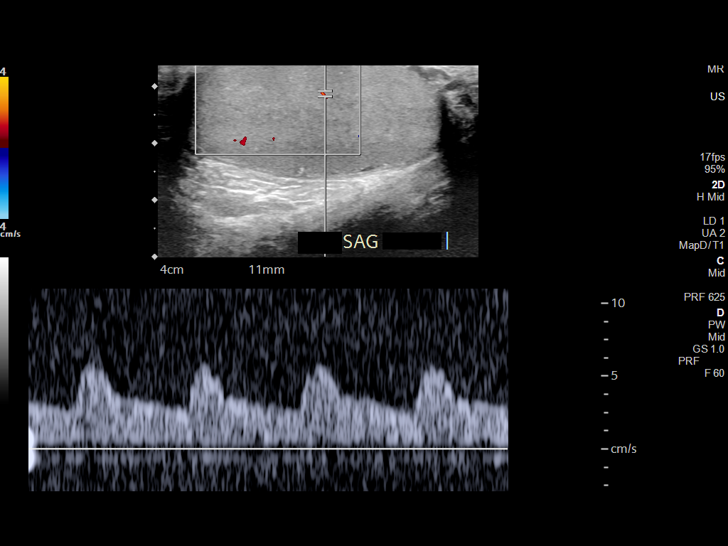
[im 6/34]
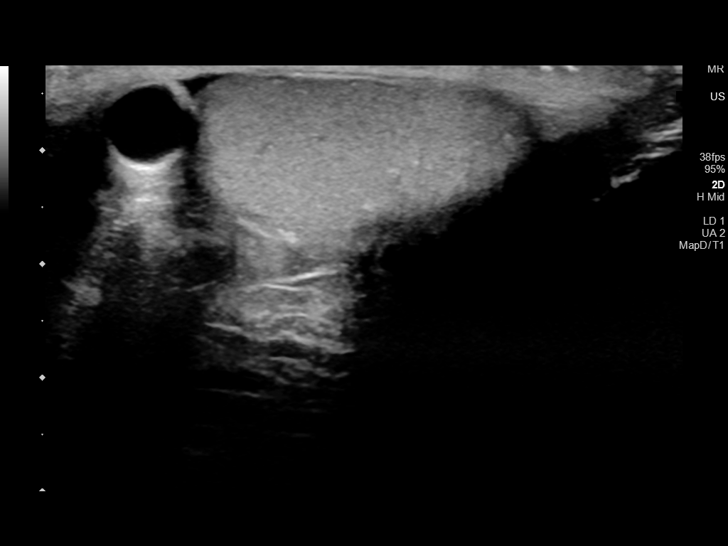
[im 9/34]
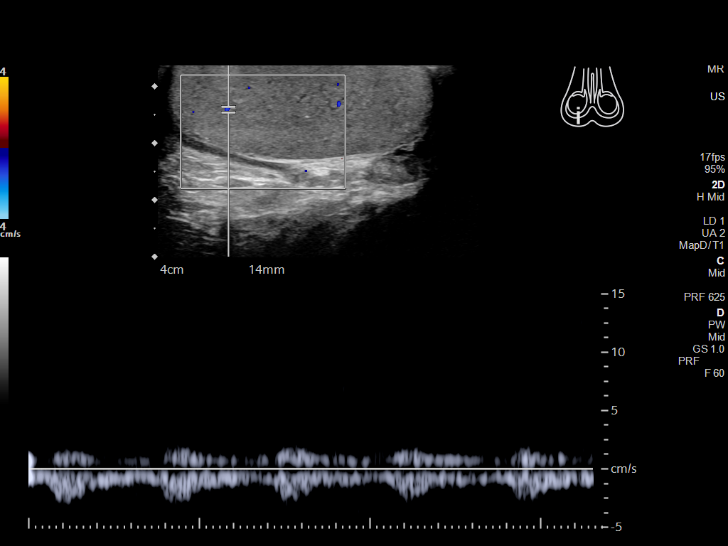
[im 12/34]
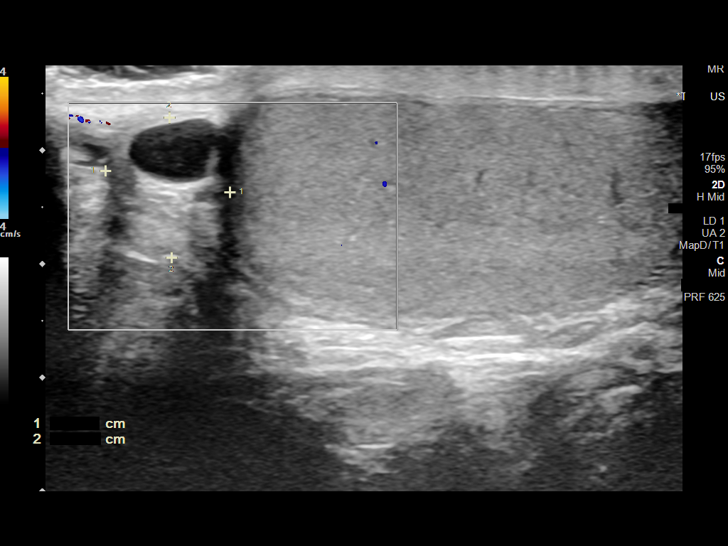
[im 14/34]
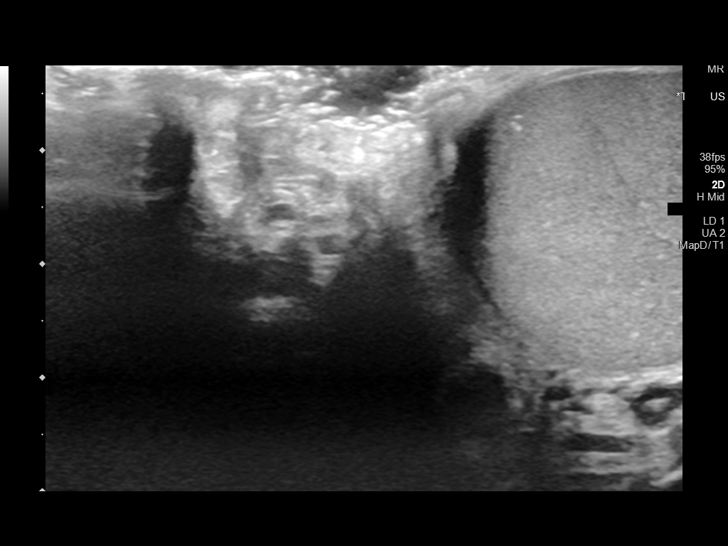
[im 17/34]
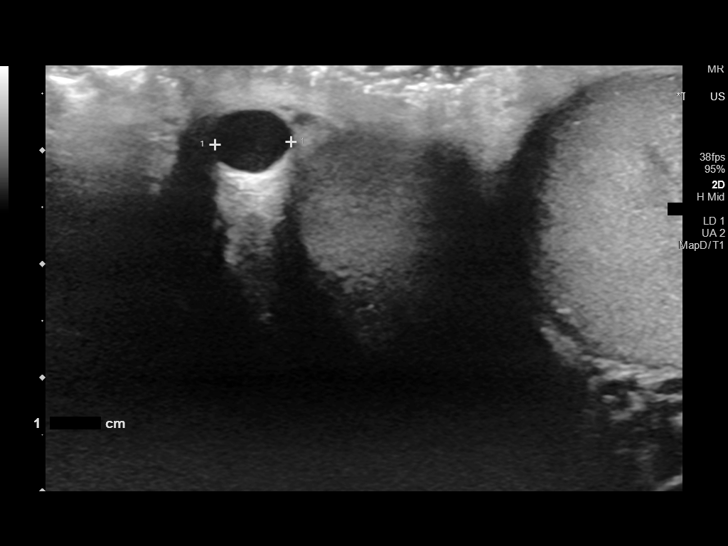
[im 20/34]
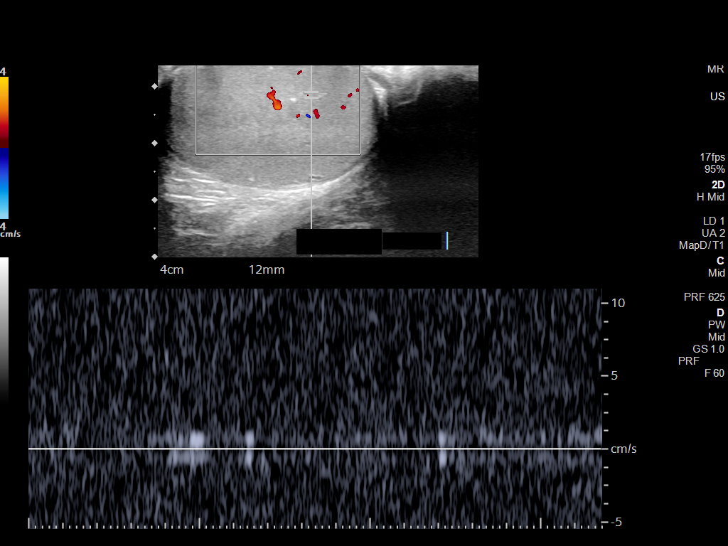
[im 23/34]
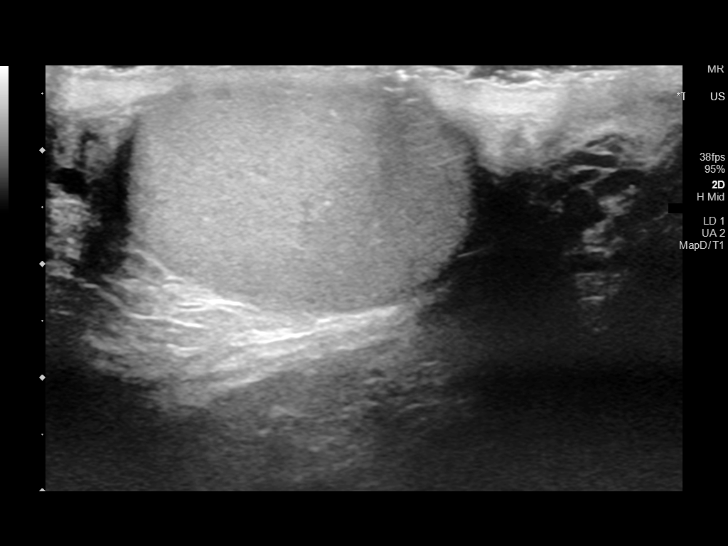
[im 25/34]
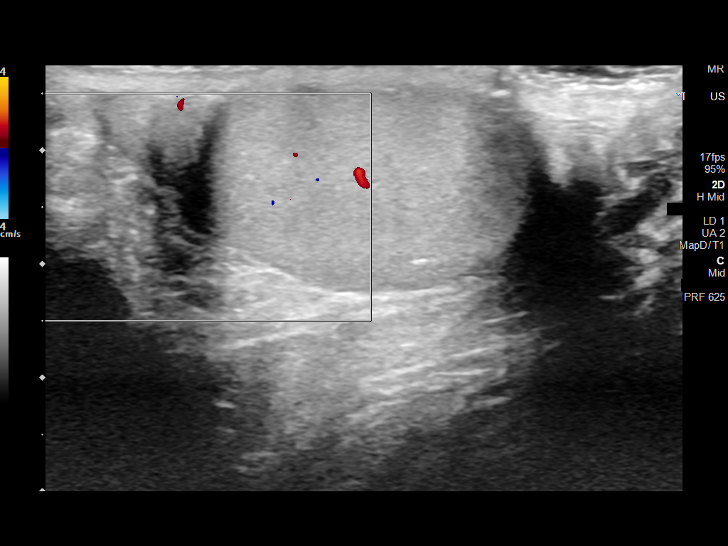
[im 28/34]
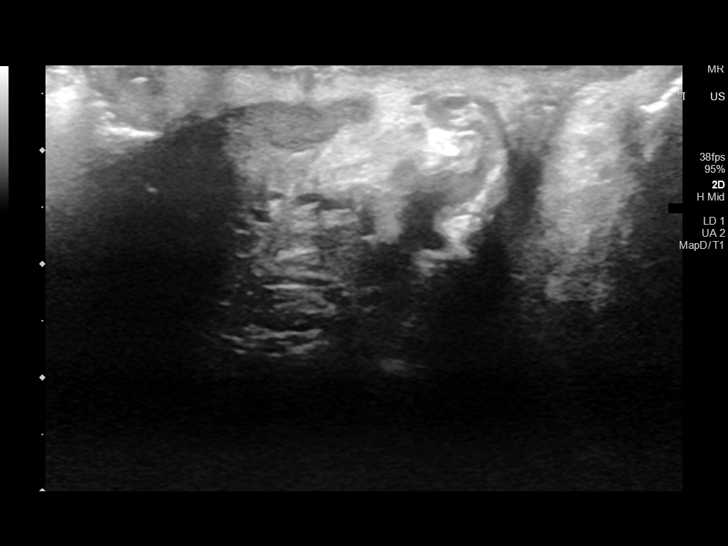
[im 31/34]
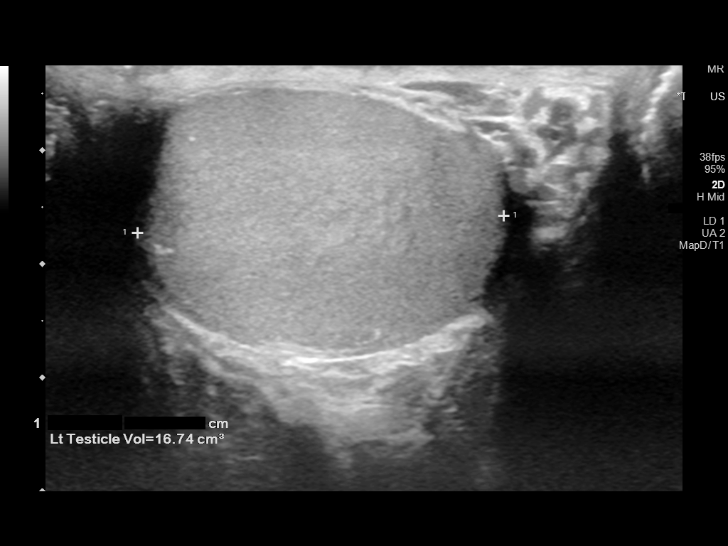
[im 34/34]
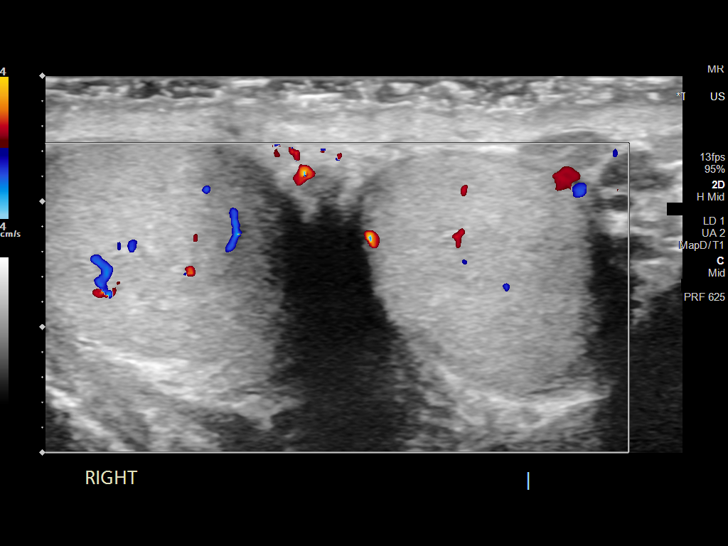

[13 of 25 positions shown; findings below may reference images not displayed]

FINDINGS: Right testicle

Measurements: 4.7 x 2.0 x 2.9 cm. Normal echogenicity without mass.
Few tiny scattered microcalcifications. Internal blood flow present
on color Doppler imaging.

Left testicle

Measurements: 4.7 x 2.1 x 3.2 cm. Normal parenchymal echogenicity
without mass. Few tiny scattered microcalcifications. Internal blood
flow present on color Doppler imaging.

Right epididymis:  Small cyst at RIGHT epididymal head 7 x 5 x 7 mm

Left epididymis:  Normal in size and appearance.

Hydrocele:  None visualized.

Varicocele:  None visualized.

Pulsed Doppler interrogation of both testes demonstrates normal low
resistance arterial and venous waveforms bilaterally.
IMPRESSION: No evidence of testicular mass or torsion.

Few tiny nonspecific microcalcifications in both testes.

7 mm epididymal cyst at RIGHT epididymal head.

## 2021-04-30 ENCOUNTER — Other Ambulatory Visit: Payer: Self-pay | Admitting: Hematology and Oncology

## 2021-04-30 DIAGNOSIS — I8290 Acute embolism and thrombosis of unspecified vein: Secondary | ICD-10-CM

## 2021-04-30 NOTE — Progress Notes (Signed)
No show - rescheduled

## 2021-05-01 ENCOUNTER — Inpatient Hospital Stay: Payer: No Typology Code available for payment source

## 2021-05-01 ENCOUNTER — Inpatient Hospital Stay
Payer: No Typology Code available for payment source | Attending: Hematology and Oncology | Admitting: Hematology and Oncology

## 2021-05-01 DIAGNOSIS — Z79899 Other long term (current) drug therapy: Secondary | ICD-10-CM | POA: Insufficient documentation

## 2021-05-01 DIAGNOSIS — Z7901 Long term (current) use of anticoagulants: Secondary | ICD-10-CM | POA: Insufficient documentation

## 2021-05-01 DIAGNOSIS — Z86718 Personal history of other venous thrombosis and embolism: Secondary | ICD-10-CM | POA: Insufficient documentation

## 2021-05-01 DIAGNOSIS — I8290 Acute embolism and thrombosis of unspecified vein: Secondary | ICD-10-CM

## 2021-05-12 ENCOUNTER — Encounter: Payer: Self-pay | Admitting: Hematology and Oncology

## 2021-05-15 ENCOUNTER — Telehealth: Payer: Self-pay | Admitting: Hematology and Oncology

## 2021-05-15 NOTE — Telephone Encounter (Signed)
R/s appts per 6/17 sch msg. Pt aware.

## 2021-05-24 ENCOUNTER — Inpatient Hospital Stay: Payer: No Typology Code available for payment source

## 2021-05-24 ENCOUNTER — Other Ambulatory Visit: Payer: Self-pay

## 2021-05-24 ENCOUNTER — Encounter: Payer: Self-pay | Admitting: Hematology and Oncology

## 2021-05-24 ENCOUNTER — Inpatient Hospital Stay (HOSPITAL_BASED_OUTPATIENT_CLINIC_OR_DEPARTMENT_OTHER): Payer: No Typology Code available for payment source | Admitting: Hematology and Oncology

## 2021-05-24 VITALS — BP 135/86 | HR 81 | Temp 97.6°F | Resp 18 | Ht 76.0 in | Wt 312.5 lb

## 2021-05-24 DIAGNOSIS — Z7901 Long term (current) use of anticoagulants: Secondary | ICD-10-CM | POA: Diagnosis not present

## 2021-05-24 DIAGNOSIS — Z86718 Personal history of other venous thrombosis and embolism: Secondary | ICD-10-CM | POA: Diagnosis not present

## 2021-05-24 DIAGNOSIS — I8290 Acute embolism and thrombosis of unspecified vein: Secondary | ICD-10-CM

## 2021-05-24 DIAGNOSIS — I824Z2 Acute embolism and thrombosis of unspecified deep veins of left distal lower extremity: Secondary | ICD-10-CM | POA: Diagnosis not present

## 2021-05-24 DIAGNOSIS — Z79899 Other long term (current) drug therapy: Secondary | ICD-10-CM | POA: Diagnosis not present

## 2021-05-24 LAB — CBC WITH DIFFERENTIAL (CANCER CENTER ONLY)
Abs Immature Granulocytes: 0.04 10*3/uL (ref 0.00–0.07)
Basophils Absolute: 0.1 10*3/uL (ref 0.0–0.1)
Basophils Relative: 1 %
Eosinophils Absolute: 0.1 10*3/uL (ref 0.0–0.5)
Eosinophils Relative: 1 %
HCT: 44.5 % (ref 39.0–52.0)
Hemoglobin: 14.8 g/dL (ref 13.0–17.0)
Immature Granulocytes: 1 %
Lymphocytes Relative: 23 %
Lymphs Abs: 1.9 10*3/uL (ref 0.7–4.0)
MCH: 29.7 pg (ref 26.0–34.0)
MCHC: 33.3 g/dL (ref 30.0–36.0)
MCV: 89.4 fL (ref 80.0–100.0)
Monocytes Absolute: 0.7 10*3/uL (ref 0.1–1.0)
Monocytes Relative: 9 %
Neutro Abs: 5.5 10*3/uL (ref 1.7–7.7)
Neutrophils Relative %: 65 %
Platelet Count: 279 10*3/uL (ref 150–400)
RBC: 4.98 MIL/uL (ref 4.22–5.81)
RDW: 13.3 % (ref 11.5–15.5)
WBC Count: 8.4 10*3/uL (ref 4.0–10.5)
nRBC: 0 % (ref 0.0–0.2)

## 2021-05-24 LAB — CMP (CANCER CENTER ONLY)
ALT: 23 U/L (ref 0–44)
AST: 21 U/L (ref 15–41)
Albumin: 4 g/dL (ref 3.5–5.0)
Alkaline Phosphatase: 90 U/L (ref 38–126)
Anion gap: 9 (ref 5–15)
BUN: 6 mg/dL (ref 6–20)
CO2: 26 mmol/L (ref 22–32)
Calcium: 8.9 mg/dL (ref 8.9–10.3)
Chloride: 105 mmol/L (ref 98–111)
Creatinine: 1.09 mg/dL (ref 0.61–1.24)
GFR, Estimated: >60 mL/min (ref >60)
Glucose, Bld: 89 mg/dL (ref 70–99)
Potassium: 4.2 mmol/L (ref 3.5–5.1)
Sodium: 140 mmol/L (ref 135–145)
Total Bilirubin: 0.5 mg/dL (ref 0.3–1.2)
Total Protein: 7.7 g/dL (ref 6.5–8.1)

## 2021-05-24 MED ORDER — ENOXAPARIN SODIUM 150 MG/ML IJ SOSY: 130.0000 mg | PREFILLED_SYRINGE | Freq: Two times a day (BID) | INTRAMUSCULAR | 4 refills | Status: DC

## 2021-05-24 NOTE — Progress Notes (Signed)
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 [place] LE showed acute deep vein thrombosis involving the left common femoral vein, left femoral vein, and left popliteal vein. Started on anticoagulation therapy. Admitted 4/21-4/24/2021. D/c on Xarelto 2) 03/17/2020: CTA shows no evidence of PE 3) 03/30/2020: patient presented to ED with worsening pain. Admitted again and underwent peripheral vascular thrombectomy. Transitioned to Apixaban. 4) 04/11/2020: establish care with Dr. Lorenso Courier  5) 06/21/2020-06/26/2020: developed new VTEs requiring hospitalization and thrombolysis/mechanical thrombectomy of the left external iliac vein, common iliac vein, and IVC with vascular surgery on 06/23/2020. He was transitioned to coumadin at that time. 6) 07/04/2020: patient therapeutic on coumadin at INR 2.3, underwent US lower extremity with showed new thrombus in the distal IVC, external iliac and internal iliac veins. 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 male with medical history significant for extensive provoked LLE DVT who presents for a follow up visit. The patient's last visit was on 01/23/2021. In the interim since the last visit Mr. Kackley has continued lovenox therapy without difficulty.  On exam today [name] reports he has continued taking the  lovenox shots but has a strong desire to discontinue taking them.  He reports he has been compliant with the shots for the most part but does occasionally forget a dose or 2.  He reports that he has not been having any overt signs of bleeding such as dark stools, nosebleeds, gum bleeding.  He does occasionally have some bleeding from his abdomen where he gives himself the shots.  He notes otherwise he has been well and has not been having any issues with fevers, chills, sweats, nausea, vomiting or diarrhea.  A full 10 point ROS is listed below.   MEDICAL HISTORY:  Past Medical History:  Diagnosis Date   Allergy    DVT (deep venous thrombosis) (HCC)    DVT (deep venous thrombosis) ([place]) 02/2020   GERD (gastroesophageal reflux disease)     SURGICAL HISTORY: Past Surgical History:  Procedure Laterality Date   INTRAVASCULAR ULTRASOUND/IVUS N/A 03/30/2020   Procedure: INTRAVASCULAR ULTRASOUND/IVUS;  Surgeon: Marty Heck, MD;  Location: [place] [facility];  Service: Cardiovascular;  Laterality: N/A;   INTRAVASCULAR ULTRASOUND/IVUS N/A 06/24/2020   Procedure: Intravascular Ultrasound/IVUS;  Surgeon: Marty Heck, MD;  Location: [place] [facility];  Service: Cardiovascular;  Laterality: N/A;   IVC VENOGRAPHY N/A 03/30/2020   Procedure: IVC Venography;  Surgeon: Marty Heck, MD;  Location: [place] [facility];  Service: Cardiovascular;  Laterality: N/A;   KNEE SURGERY Right    LEG SURGERY     Reports history of right lower extremity tibia fracture in 2010 which was repaired surgically.   LOWER EXTREMITY VENOGRAPHY Left 03/30/2020   Procedure: LOWER EXTREMITY VENOGRAPHY;  Surgeon: Marty Heck, MD;  Location: [place] [facility];  Service: Cardiovascular;  Laterality: Left;   LOWER EXTREMITY VENOGRAPHY  06/23/2020   Procedure: LOWER EXTREMITY VENOGRAPHY;  Surgeon: Carlis Abbott,  Gwenyth Allegra, MD;  Location: [place] [facility];  Service: Cardiovascular;;   PERIPHERAL VASCULAR  THROMBECTOMY Left 03/30/2020   Procedure: PERIPHERAL VASCULAR THROMBECTOMY;  Surgeon: Marty Heck, MD;  Location: [place] [facility];  Service: Cardiovascular;  Laterality: Left;   PERIPHERAL VASCULAR THROMBECTOMY N/A 06/24/2020   Procedure: PERIPHERAL VASCULAR THROMBECTOMY;  Surgeon: Marty Heck, MD;  Location: [place] [facility];  Service: Cardiovascular;  Laterality: N/A;   THROMBECTOMY  03/2020   ULTRASOUND GUIDANCE FOR VASCULAR ACCESS N/A 07/28/2020   Procedure: ULTRASOUND GUIDANCE ACCESS OF RIGHT INTERNAL JUGULAR, ULTRASOUND GUIDANCE ACCESS OF POSTERIOR TIBIAL VEIN;  Surgeon: Waynetta Sandy, MD;  Location: [place];  Service: Vascular;  Laterality: N/A;   VENOGRAM Left 07/28/2020   Procedure: CENTRAL VENOGRAM AND LEFT LOWER EXTREMITY VENOGRAM;  Surgeon: Waynetta Sandy, MD;  Location: [facility] OR;  Service: Vascular;  Laterality: Left;   WISDOM TOOTH EXTRACTION      SOCIAL HISTORY: Social History   Socioeconomic History   Marital status: Single    Spouse name: Not on file   Number of children: Not on file   Years of education: Not on file   Highest education level: Not on file  Occupational History   Not on file  Tobacco Use   Smoking status: Former    Pack years: 0.00    Types: E-cigarettes, Cigarettes   Smokeless tobacco: Never  Vaping Use   Vaping Use: Former  Substance and Sexual Activity   Alcohol use: Not Currently   Drug use: Not Currently   Sexual activity: Yes    Birth control/protection: None  Other Topics Concern   Not on file  Social History Narrative   ** Merged History Encounter **       Social Determinants of Health   Financial Resource Strain: Not on file  Food Insecurity: Not on file  Transportation Needs: Not on file  Physical Activity: Not on file  Stress: Not on file  Social Connections: Not on file  Intimate Partner Violence: Not on file    FAMILY HISTORY: Family History  Adopted: Yes  Problem Relation Age of  Onset   Venous thrombosis Neg Hx     ALLERGIES:  has No Known Allergies.  MEDICATIONS:  Current Outpatient Medications  Medication Sig Dispense Refill   buPROPion (WELLBUTRIN XL) 150 MG 24 hr tablet Take 150 mg by mouth daily.     cetirizine (ZYRTEC) 10 MG tablet      enoxaparin (LOVENOX) 150 MG/ML injection Inject 0.86 mLs (130 mg total) into the skin every 12 (twelve) hours. 60 mL 4   methylphenidate 27 MG PO CR tablet Take 27 mg by mouth every morning.     No current facility-administered medications for this visit.    REVIEW OF SYSTEMS:   Constitutional: ( - ) fevers, ( - )  chills , ( - ) night sweats Eyes: ( - ) blurriness of vision, ( - ) double vision, ( - ) watery eyes Ears, nose, mouth, throat, and face: ( - ) mucositis, ( - ) sore throat Respiratory: ( - ) cough, ( - ) dyspnea, ( - ) wheezes Cardiovascular: ( - ) palpitation, ( - ) chest discomfort, ( - ) lower extremity swelling Gastrointestinal:  ( - ) nausea, ( - ) heartburn, ( - ) change in bowel habits Skin: ( - ) abnormal skin rashes Lymphatics: ( - ) new lymphadenopathy, ( - ) easy bruising Neurological: ( - ) numbness, ( - )  tingling, ( - ) new weaknesses Behavioral/Psych: ( - ) mood change, ( - ) new changes  All other systems were reviewed with the patient and are negative.  PHYSICAL EXAMINATION: ECOG PERFORMANCE STATUS: 0 - Asymptomatic  Vitals:   05/24/21 1142  BP: 135/86  Pulse: 81  Resp: 18  Temp: 97.6 F (36.4 C)  SpO2: 100%   Filed Weights   05/24/21 1142  Weight: (!) 312 lb 8 oz (141.7 kg)    GENERAL: well appearing young male,  alert, no distress and comfortable SKIN: No skin lesions noted.  EYES: conjunctiva are pink and non-injected, sclera clear LUNGS: clear to auscultation and percussion with normal breathing effort HEART: regular rate & rhythm and no murmurs. Musculoskeletal: no cyanosis of digits and no clubbing. No LE edema. No erythema or tenderness of the LLE.  PSYCH: alert &  oriented x 3, fluent speech NEURO: no focal motor/sensory deficits  LABORATORY DATA:  I have reviewed the data as listed CBC Latest Ref Rng & Units 05/24/2021 01/23/2021 10/17/2020  WBC 4.0 - 10.5 K/uL 8.4 8.6 7.2  Hemoglobin 13.0 - 17.0 g/dL 14.8 14.8 13.7  Hematocrit 39.0 - 52.0 % 44.5 45.4 43.5  Platelets 150 - 400 K/uL 279 317 352    CMP Latest Ref Rng & Units 05/24/2021 01/23/2021 10/17/2020  Glucose 70 - 99 mg/dL 89 96 95  BUN 6 - 20 mg/dL 6 8 5(L)  Creatinine 0.61 - 1.24 mg/dL 1.09 1.08 0.89  Sodium 135 - 145 mmol/L 140 140 139  Potassium 3.5 - 5.1 mmol/L 4.2 4.5 4.2  Chloride 98 - 111 mmol/L 105 104 105  CO2 22 - 32 mmol/L 26 25 28   Calcium 8.9 - 10.3 mg/dL 8.9 9.6 9.5  Total Protein 6.5 - 8.1 g/dL 7.7 8.5(H) 8.4(H)  Total Bilirubin 0.3 - 1.2 mg/dL 0.5 0.4 0.3  Alkaline Phos 38 - 126 U/L 90 88 93  AST 15 - 41 U/L 21 16 11(L)  ALT 0 - 44 U/L 23 21 12     RADIOGRAPHIC STUDIES: No results found.  ASSESSMENT & PLAN Suvan Stcyr 30 y.o. male with medical history significant for extensive provoked LLE DVT who presents for a follow up visit.  After review the labs, discussion with the patient, and review of the prior records the patient's findings are most consistent with recurrent VTEs of unclear etiology.  On exam today [name] is doing well overall from a thrombosis standpoint.  He is not having any further swelling or discomfort in his lower extremities.  He does however have issues with taking the Lovenox including bruising, nodules, and the inability to administer the shots himself.  He is eager for an alternative form of anticoagulation.  We discussed in detail today that he has failed therapy on Eliquis, Xarelto, and Coumadin.  It would be most appropriate for him to continue with the Lovenox, though I did note that with his referral to Dr. Dionne Milo he may be offered other suggestions for how to treat this thrombosis.  During the patient's prior hospitalization he was  seen by Dr. Monica Martinez with vascular surgery who performed thrombolysis and percutaneous mechanical thrombectomy of the left external iliac, common iliac, distal IVC on 06/24/2020.  Due to concern the fact that the occurred while on anticoagulation therapy with Eliquis he was transitioned to Coumadin therapy at time of discharge and is subsequently established with Coumadin clinic in the outpatient setting.  After d/c of Lovenox on Saturday the patient began to have  worsening leg swelling and developed tender erythematous raised lesions. These have subsequently resolved. 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 Patient appears stable on lovenox at this time.  --prior hypercoagulation/inflammatory workups were unrevealing. --will also attempt to r/o malignancy with a testicular US (prior imaging of Chest (02/2020) and abdomen/pelvis (05/2020) showed no evidence of disease)  --still in process of trying to connect the patient with a hematologist at [facility]. He is a visit scheduled for 03/2022.  We'll attempt to have this moved up.  --vascular surgery evaluated for a compressive syndrome (such as May Thurner). No evidence of this was found. Appreciate their assistance in this case.  --RTC in 6 months time  No orders of the defined types were placed in this encounter.  All questions were answered. The patient knows to call the clinic with any problems, questions or concerns.  A total of more than 30 minutes were spent on this encounter and over half of that time was spent on counseling and coordination of care as outlined above.   Ledell Peoples, MD Department of Hematology/Oncology [place] [facility] Phone: [phone] Pager: [phone] Email: [email or web address]  05/24/2021 7:01 PM

## 2021-05-31 ENCOUNTER — Telehealth: Payer: Self-pay | Admitting: Hematology and Oncology

## 2021-05-31 NOTE — Telephone Encounter (Signed)
Sch per 06/30 sch msg, pt aware

## 2021-06-24 ENCOUNTER — Emergency Department (HOSPITAL_BASED_OUTPATIENT_CLINIC_OR_DEPARTMENT_OTHER)
Admission: EM | Admit: 2021-06-24 | Discharge: 2021-06-24 | Disposition: A | Discharge status: Home / Self Care | Payer: Medicaid Other | Attending: Emergency Medicine | Admitting: Emergency Medicine

## 2021-06-24 ENCOUNTER — Emergency Department (HOSPITAL_BASED_OUTPATIENT_CLINIC_OR_DEPARTMENT_OTHER): Payer: Medicaid Other

## 2021-06-24 ENCOUNTER — Encounter (HOSPITAL_BASED_OUTPATIENT_CLINIC_OR_DEPARTMENT_OTHER): Payer: Self-pay | Admitting: Emergency Medicine

## 2021-06-24 ENCOUNTER — Other Ambulatory Visit: Payer: Self-pay

## 2021-06-24 DIAGNOSIS — Z87891 Personal history of nicotine dependence: Secondary | ICD-10-CM | POA: Insufficient documentation

## 2021-06-24 DIAGNOSIS — Z20822 Contact with and (suspected) exposure to covid-19: Secondary | ICD-10-CM | POA: Diagnosis not present

## 2021-06-24 DIAGNOSIS — B349 Viral infection, unspecified: Secondary | ICD-10-CM

## 2021-06-24 DIAGNOSIS — J029 Acute pharyngitis, unspecified: Secondary | ICD-10-CM | POA: Diagnosis not present

## 2021-06-24 DIAGNOSIS — R55 Syncope and collapse: Secondary | ICD-10-CM | POA: Insufficient documentation

## 2021-06-24 DIAGNOSIS — R0602 Shortness of breath: Secondary | ICD-10-CM

## 2021-06-24 LAB — CBC WITH DIFFERENTIAL/PLATELET
Abs Immature Granulocytes: 0.06 10*3/uL (ref 0.00–0.07)
Basophils Absolute: 0.1 10*3/uL (ref 0.0–0.1)
Basophils Relative: 1 %
Eosinophils Absolute: 0 10*3/uL (ref 0.0–0.5)
Eosinophils Relative: 0 %
HCT: 44.1 % (ref 39.0–52.0)
Hemoglobin: 14.5 g/dL (ref 13.0–17.0)
Immature Granulocytes: 0 %
Lymphocytes Relative: 14 %
Lymphs Abs: 1.9 10*3/uL (ref 0.7–4.0)
MCH: 29.2 pg (ref 26.0–34.0)
MCHC: 32.9 g/dL (ref 30.0–36.0)
MCV: 88.7 fL (ref 80.0–100.0)
Monocytes Absolute: 1.1 10*3/uL — ABNORMAL HIGH (ref 0.1–1.0)
Monocytes Relative: 8 %
Neutro Abs: 10.2 10*3/uL — ABNORMAL HIGH (ref 1.7–7.7)
Neutrophils Relative %: 77 %
Platelets: 280 10*3/uL (ref 150–400)
RBC: 4.97 MIL/uL (ref 4.22–5.81)
RDW: 13.5 % (ref 11.5–15.5)
WBC: 13.4 10*3/uL — ABNORMAL HIGH (ref 4.0–10.5)
nRBC: 0 % (ref 0.0–0.2)

## 2021-06-24 LAB — COMPREHENSIVE METABOLIC PANEL
ALT: 14 U/L (ref 0–44)
AST: 14 U/L — ABNORMAL LOW (ref 15–41)
Albumin: 4.4 g/dL (ref 3.5–5.0)
Alkaline Phosphatase: 73 U/L (ref 38–126)
Anion gap: 9 (ref 5–15)
BUN: 5 mg/dL — ABNORMAL LOW (ref 6–20)
CO2: 27 mmol/L (ref 22–32)
Calcium: 9.7 mg/dL (ref 8.9–10.3)
Chloride: 102 mmol/L (ref 98–111)
Creatinine, Ser: 0.91 mg/dL (ref 0.61–1.24)
GFR, Estimated: >60 mL/min (ref >60)
Glucose, Bld: 97 mg/dL (ref 70–99)
Potassium: 3.5 mmol/L (ref 3.5–5.1)
Sodium: 138 mmol/L (ref 135–145)
Total Bilirubin: 0.5 mg/dL (ref 0.3–1.2)
Total Protein: 8 g/dL (ref 6.5–8.1)

## 2021-06-24 LAB — RESP PANEL BY RT-PCR (FLU A&B, COVID) ARPGX2
Influenza A by PCR: NEGATIVE
Influenza B by PCR: NEGATIVE
SARS Coronavirus 2 by RT PCR: NEGATIVE

## 2021-06-24 LAB — URINALYSIS, ROUTINE W REFLEX MICROSCOPIC
Bilirubin Urine: NEGATIVE
Glucose, UA: NEGATIVE mg/dL
Hgb urine dipstick: NEGATIVE
Ketones, ur: NEGATIVE mg/dL
Leukocytes,Ua: NEGATIVE
Nitrite: NEGATIVE
Specific Gravity, Urine: 1.025 (ref 1.005–1.030)
pH: 6.5 (ref 5.0–8.0)

## 2021-06-24 LAB — GROUP A STREP BY PCR: Group A Strep by PCR: NOT DETECTED

## 2021-06-24 MED ORDER — ALUM & MAG HYDROXIDE-SIMETH 200-200-20 MG/5ML PO SUSP
30.0000 mL | Freq: Once | ORAL | Status: AC
  Administered 2021-06-24: 30 mL via ORAL
  Filled 2021-06-24: qty 30

## 2021-06-24 MED ORDER — AMOXICILLIN 500 MG PO CAPS: 500.0000 mg | ORAL_CAPSULE | Freq: Three times a day (TID) | ORAL | 0 refills | Status: DC

## 2021-06-24 MED ORDER — LIDOCAINE VISCOUS HCL 2 % MT SOLN
15.0000 mL | Freq: Once | OROMUCOSAL | Status: AC
  Administered 2021-06-24: 15 mL via OROMUCOSAL
  Filled 2021-06-24: qty 15

## 2021-06-24 MED ORDER — LIDOCAINE VISCOUS HCL 2 % MT SOLN: 5.0000 mL | Freq: Three times a day (TID) | OROMUCOSAL | 0 refills | Status: DC | PRN

## 2021-06-24 MED ORDER — MAGIC MOUTHWASH: 5.0000 mL | Freq: Once | ORAL | Status: DC

## 2021-06-24 NOTE — ED Notes (Signed)
PT ambulated to end of hall and back with no signs of tachypnea or drop in o2 saturation. ED MD notified.

## 2021-06-24 NOTE — ED Provider Notes (Signed)
[place] EMERGENCY DEPT Provider Note   CSN: [id number] Arrival date & time: 06/24/21  [id number]     History Chief Complaint  Patient presents with   Loss of Consciousness   Shortness of Breath    Noah Moore is a 30 y.o. male.  The history is provided by the patient.  Loss of Consciousness Episode history:  Single Most recent episode:  Today Duration:  15 seconds Timing:  Constant Progression:  Resolved Chronicity:  New Context: standing up   Witnessed: yes   Worsened by:  Nothing Associated symptoms: shortness of breath   Associated symptoms: no chest pain, no headaches and no vomiting   Patient with history of DVT on Lovenox presents after syncope.  Patient reports recent symptoms of a URI for several days.  He reports chills, fevers, sore throat and cough.  He reports mild shortness of breath due to the illness Reports recent sick contacts No vomiting or diarrhea. Patient reports after standing up from the couch he had a brief episode of syncope.  No seizures.  Denies any traumatic injuries.  Denies any significant headache.  He reports he felt the carpet.  He reports his family asked him to come in to be evaluated.  He reports continued sore throat and congestion.  No active chest pain or shortness of breath at rest.    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   SIRS (systemic inflammatory response syndrome) ([place]) 03/17/2020   Paronychia of finger 03/17/2020   Thrombocytosis 03/17/2020   Sepsis ([place]) 03/17/2020    Past Surgical History:  Procedure  Laterality Date   INTRAVASCULAR ULTRASOUND/IVUS N/A 03/30/2020   Procedure: INTRAVASCULAR ULTRASOUND/IVUS;  Surgeon: Marty Heck, MD;  Location: [place] [facility];  Service: Cardiovascular;  Laterality: N/A;   INTRAVASCULAR ULTRASOUND/IVUS N/A 06/24/2020   Procedure: Intravascular Ultrasound/IVUS;  Surgeon: Marty Heck, MD;  Location: [place] [facility];  Service: Cardiovascular;  Laterality: N/A;   IVC VENOGRAPHY N/A 03/30/2020   Procedure: IVC Venography;  Surgeon: Marty Heck, MD;  Location: [place] [facility];  Service: Cardiovascular;  Laterality: N/A;   KNEE SURGERY Right    LEG SURGERY     Reports history of right lower extremity tibia fracture in 2010 which was repaired surgically.   LOWER EXTREMITY VENOGRAPHY Left 03/30/2020   Procedure: LOWER EXTREMITY VENOGRAPHY;  Surgeon: Marty Heck, MD;  Location: [place] [facility];  Service: Cardiovascular;  Laterality: Left;   LOWER EXTREMITY VENOGRAPHY  06/23/2020   Procedure: LOWER EXTREMITY VENOGRAPHY;  Surgeon: Marty Heck, MD;  Location: [place] [facility];  Service: Cardiovascular;;   PERIPHERAL VASCULAR THROMBECTOMY Left 03/30/2020   Procedure: PERIPHERAL VASCULAR THROMBECTOMY;  Surgeon: Marty Heck, MD;  Location: [place] [facility];  Service: Cardiovascular;  Laterality: Left;   PERIPHERAL VASCULAR THROMBECTOMY N/A 06/24/2020   Procedure: PERIPHERAL VASCULAR THROMBECTOMY;  Surgeon: Marty Heck, MD;  Location: [place] [facility];  Service: Cardiovascular;  Laterality: N/A;   THROMBECTOMY  03/2020   ULTRASOUND GUIDANCE FOR VASCULAR ACCESS N/A 07/28/2020   Procedure: ULTRASOUND GUIDANCE  ACCESS OF RIGHT INTERNAL JUGULAR, ULTRASOUND GUIDANCE ACCESS OF POSTERIOR TIBIAL VEIN;  Surgeon: Waynetta Sandy, MD;  Location: [place];  Service: Vascular;  Laterality: N/A;   VENOGRAM Left 07/28/2020   Procedure: CENTRAL VENOGRAM AND LEFT LOWER EXTREMITY VENOGRAM;  Surgeon: Waynetta Sandy, MD;  [facility];  Service: Vascular;  Laterality: Left;   WISDOM TOOTH EXTRACTION         Family History  Adopted: Yes  Problem Relation Age of Onset   Venous thrombosis Neg Hx     Social History   Tobacco Use   Smoking status: Former    Types: E-cigarettes, Cigarettes   Smokeless tobacco: Never  Vaping Use   Vaping Use: Former  Substance Use Topics   Alcohol use: Not Currently   Drug use: Not Currently    Home Medications Prior to Admission medications   Medication Sig Start Date End Date Taking? Authorizing Provider  amoxicillin (AMOXIL) 500 MG capsule Take 1 capsule (500 mg total) by mouth 3 (three) times daily. 06/24/21  Yes Ripley Fraise, MD  magic mouthwash (lidocaine, diphenhydrAMINE, alum & mag hydroxide) suspension Swish and spit 5 mLs 3 (three) times daily as needed for mouth pain. 06/24/21  Yes Ripley Fraise, MD  buPROPion (WELLBUTRIN XL) 150 MG 24 hr tablet Take 150 mg by mouth daily.    [provider]  cetirizine (ZYRTEC) 10 MG tablet     [provider]  enoxaparin (LOVENOX) 150 MG/ML injection Inject 0.86 mLs (130 mg total) into the skin every 12 (twelve) hours. 05/24/21   Orson Slick, MD  methylphenidate 27 MG PO CR tablet Take 27 mg by mouth every morning.    [provider]    Allergies    Patient has no known allergies.  Review of Systems   Review of Systems  Constitutional:  Positive for chills and fatigue.  HENT:  Positive for congestion and sore throat.   Respiratory:  Positive for cough and shortness of breath.   Cardiovascular:  Negative for chest pain.  Gastrointestinal:  Negative for vomiting.  Neurological:  Positive for syncope. Negative for headaches.  All other systems reviewed and are negative.  Physical Exam Updated Vital Signs BP 125/88   Pulse 87   Temp 98.3 F (36.8 C) (Oral)   Resp 18   Ht 1.93 m ('6\' 4"'$ )   Wt (!) 142.9 kg   SpO2 100%   BMI 38.34 kg/m   Physical  Exam CONSTITUTIONAL: Well developed/well nourished HEAD: Normocephalic/atraumatic, no signs of trauma EYES: EOMI/PERRL ENMT: Mucous membranes moist bilateral TMs clear and intact Uvula midline with erythema but no exudates, no angioedema No visible trauma NECK: supple no meningeal signs SPINE/BACK:entire spine nontender, no bruising/crepitance/stepoffs noted to spine CV: S1/S2 noted, no murmurs/rubs/gallops noted LUNGS: Lungs are clear to auscultation bilaterally, no apparent distress ABDOMEN: soft, nontender, no rebound or guarding, bowel sounds noted throughout abdomen GU:no cva tenderness NEURO: Pt is awake/alert/appropriate, moves all extremitiesx4.  No facial droop.   EXTREMITIES: pulses normal/equal, full ROM, no deformities are noted.  Lower extreme edema noted left leg (history of DVT) SKIN: warm, color normal PSYCH: no abnormalities of mood noted, alert and oriented to situation  ED Results / Procedures / Treatments   Labs (all labs ordered are listed, but only abnormal results are displayed) Labs Reviewed  CBC WITH DIFFERENTIAL/PLATELET - Abnormal; Notable for the following components:      Result Value   WBC 13.4 (*)    Neutro  Abs 10.2 (*)    Monocytes Absolute 1.1 (*)    All other components within normal limits  COMPREHENSIVE METABOLIC PANEL - Abnormal; Notable for the following components:   BUN 5 (*)    AST 14 (*)    All other components within normal limits  URINALYSIS, ROUTINE W REFLEX MICROSCOPIC - Abnormal; Notable for the following components:   Protein, ur TRACE (*)    All other components within normal limits  RESP PANEL BY RT-PCR (FLU A&B, COVID) [id number]  GROUP A STREP BY PCR    EKG EKG Interpretation  Date/Time:  Saturday June 24 2021 00:56:33 EDT Ventricular Rate:  107 PR Interval:  143 QRS Duration: 96 QT Interval:  317 QTC Calculation: 423 R Axis:   71 Text Interpretation: Sinus tachycardia Borderline repolarization abnormality Confirmed by  Ripley Fraise [phone]) on 06/24/2021 1:35:47 AM  Radiology DG Chest Port 1 View  Result Date: 06/24/2021 CLINICAL DATA:  Dyspnea EXAM: PORTABLE CHEST 1 VIEW COMPARISON:  03/16/2020 FINDINGS: The heart size and mediastinal contours are within normal limits. Both lungs are clear. The visualized skeletal structures are unremarkable. IMPRESSION: No active disease. Electronically Signed   By: Fidela Salisbury MD   On: 06/24/2021 01:38    Procedures Procedures   Medications Ordered in ED Medications  alum & mag hydroxide-simeth (MAALOX/MYLANTA) 200-200-20 MG/5ML suspension 30 mL (30 mLs Oral Given 06/24/21 0506)  lidocaine (XYLOCAINE) 2 % viscous mouth solution 15 mL (15 mLs Mouth/Throat Given 06/24/21 0507)    ED Course  I have reviewed the triage vital signs and the nursing notes.  Pertinent labs & imaging results that were available during my care of the patient were reviewed by me and considered in my medical decision making (see chart for details).    MDM Rules/Calculators/A&P                           Patient w/ history of DVT presents with recent viral symptoms and then had a syncopal episode upon standing..  Patient reports cough, congestion, sore throat.  He reports this feels like similar viral illnesses in the past.  Extensive work-up in the emergency department is negative.  He is COVID-negative.  He reports good relief with Magic mouthwash and requests prescription for discharge.  Also due to the severity of pharyngitis, will add on antibiotic patient is agreeable  Low suspicion for cardiac dysrhythmia at this time as cause of his syncope. Patient is not symptomatic in the ER No dysrhythmias on telemetry on the monitor in the ER  Patient has long history of DVT and is on Lovenox and he is compliant.  Patient reports recent left leg swelling, but he has had this in the past and seems to be exacerbated with sitting which she has done recently Patient was able to ambulate without  any tachycardia or hypoxia.  He denies any pleuritic chest pain.  I have low suspicion for breakthrough PE at this time.  Patient has any significant headache, or vomiting since the fall.  No signs of any head trauma.  Patient prefers not to have any neuroimaging at this time.  Low suspicion for ICH at this time.  We discussed strict return precautions Final Clinical Impression(s) / ED Diagnoses Final diagnoses:  Syncope and collapse  Pharyngitis, unspecified etiology  Viral syndrome    Rx / DC Orders ED Discharge Orders          Ordered    amoxicillin (AMOXIL)  500 MG capsule  3 times daily        06/24/21 0601    magic mouthwash (lidocaine, diphenhydrAMINE, alum & mag hydroxide) suspension  3 times daily PRN        06/24/21 0602             Ripley Fraise, MD 06/24/21 [phone]

## 2021-06-24 NOTE — ED Triage Notes (Signed)
  Patient comes in with URI symptoms that have been going on since 7/26.  Patient states that several people in his house have fever/cold symptoms.  Patient states he was laying down on the couch and got SOB, stood up to go into another room and had a syncopal episode.  Patient recently treated for DVTs in L leg and placed on lovenox injection.  Pain 2/10, left leg cramping.

## 2021-07-04 ENCOUNTER — Ambulatory Visit
Admission: EM | Admit: 2021-07-04 | Discharge: 2021-07-04 | Disposition: A | Discharge status: Home / Self Care | Payer: 59 | Attending: Internal Medicine | Admitting: Internal Medicine

## 2021-07-04 ENCOUNTER — Encounter: Payer: Self-pay | Admitting: Emergency Medicine

## 2021-07-04 ENCOUNTER — Other Ambulatory Visit: Payer: Self-pay

## 2021-07-04 ENCOUNTER — Ambulatory Visit (INDEPENDENT_AMBULATORY_CARE_PROVIDER_SITE_OTHER): Payer: 59

## 2021-07-04 DIAGNOSIS — R059 Cough, unspecified: Secondary | ICD-10-CM

## 2021-07-04 DIAGNOSIS — H65193 Other acute nonsuppurative otitis media, bilateral: Secondary | ICD-10-CM

## 2021-07-04 DIAGNOSIS — R053 Chronic cough: Secondary | ICD-10-CM

## 2021-07-04 DIAGNOSIS — J069 Acute upper respiratory infection, unspecified: Secondary | ICD-10-CM

## 2021-07-04 MED ORDER — BENZONATATE 100 MG PO CAPS: 100.0000 mg | ORAL_CAPSULE | Freq: Three times a day (TID) | ORAL | 0 refills | Status: DC | PRN

## 2021-07-04 MED ORDER — CEFDINIR 300 MG PO CAPS: 300.0000 mg | ORAL_CAPSULE | Freq: Two times a day (BID) | ORAL | 0 refills | Status: AC

## 2021-07-04 MED ORDER — PREDNISONE 20 MG PO TABS: 40.0000 mg | ORAL_TABLET | Freq: Every day | ORAL | 0 refills | Status: AC

## 2021-07-04 NOTE — ED Triage Notes (Signed)
Patient c/o productive cough x 3 weeks, worse at night and qam.  Patient currently on Lovenox for blood clots in leg.

## 2021-07-04 NOTE — Discharge Instructions (Addendum)
You are being treated for bilateral ear infection with cefdinir antibiotic.  This antibiotic will also treat upper respiratory infection.  You have also been prescribed prednisone steroid to decrease inflammation in chest and help with cough.  Benzonatate has been prescribed to take as needed for cough.  You may also try Mucinex over-the-counter for cough.  Please go to the hospital if shortness of breath develops or if chest pain develops.

## 2021-07-04 NOTE — ED Provider Notes (Signed)
[facility]    CSN: [id number] Arrival date & time: 07/04/21  1646      History   Chief Complaint Chief Complaint  Patient presents with   Cough    HPI Noah Moore is a 30 y.o. male.   Patient presents with 3-week history of productive cough, nasal congestion, ear discomfort.  Patient was seen previously at ED on 7/30 for same symptoms.  Patient had negative COVID-19 test and a strep test at ED. Was prescribed amoxicillin to treat symptoms but states that there has been no improvement in symptoms.  Denies any chest pain, shortness of breath, or fever. Patient had negative COVID-19 test and a strep test at ED. Has only taken amoxicillin to relieve symptoms and has completed antibiotic course.   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   SIRS (systemic inflammatory response syndrome) ([place]) 03/17/2020   Paronychia of finger 03/17/2020   Thrombocytosis 03/17/2020   Sepsis ([place]) 03/17/2020    Past Surgical History:  Procedure Laterality Date   INTRAVASCULAR ULTRASOUND/IVUS N/A 03/30/2020   Procedure: INTRAVASCULAR ULTRASOUND/IVUS;  Surgeon: Marty Heck, MD;  Location: [place] [facility];  Service: Cardiovascular;  Laterality: N/A;   INTRAVASCULAR ULTRASOUND/IVUS N/A 06/24/2020   Procedure: Intravascular Ultrasound/IVUS;  Surgeon: Marty Heck, MD;  Location: [place] [facility];  Service: Cardiovascular;  Laterality: N/A;   IVC VENOGRAPHY N/A 03/30/2020   Procedure: IVC Venography;  Surgeon: Marty Heck, MD;  Location: [place] [facility];   Service: Cardiovascular;  Laterality: N/A;   KNEE SURGERY Right    LEG SURGERY     Reports history of right lower extremity tibia fracture in 2010 which was repaired surgically.   LOWER EXTREMITY VENOGRAPHY Left 03/30/2020   Procedure: LOWER EXTREMITY VENOGRAPHY;  Surgeon: Marty Heck, MD;  Location: [place] [facility];  Service: Cardiovascular;  Laterality: Left;   LOWER EXTREMITY VENOGRAPHY  06/23/2020   Procedure: LOWER EXTREMITY VENOGRAPHY;  Surgeon: Marty Heck, MD;  Location: [place] [facility];  Service: Cardiovascular;;   PERIPHERAL VASCULAR THROMBECTOMY Left 03/30/2020   Procedure: PERIPHERAL VASCULAR THROMBECTOMY;  Surgeon: Marty Heck, MD;  Location: [place] [facility];  Service: Cardiovascular;  Laterality: Left;   PERIPHERAL VASCULAR THROMBECTOMY N/A 06/24/2020   Procedure: PERIPHERAL VASCULAR THROMBECTOMY;  Surgeon: Marty Heck, MD;  Location: [place] [facility];  Service: Cardiovascular;  Laterality: N/A;   THROMBECTOMY  03/2020   ULTRASOUND GUIDANCE FOR VASCULAR ACCESS N/A 07/28/2020   Procedure: ULTRASOUND GUIDANCE ACCESS OF RIGHT INTERNAL JUGULAR, ULTRASOUND GUIDANCE ACCESS OF POSTERIOR TIBIAL VEIN;  Surgeon: Waynetta Sandy, MD;  Location: [place];  Service: Vascular;  Laterality: N/A;   VENOGRAM Left 07/28/2020   Procedure: CENTRAL VENOGRAM AND LEFT LOWER EXTREMITY VENOGRAM;  Surgeon: Waynetta Sandy, MD;  Location: [place];  Service: Vascular;  Laterality: Left;   [place] Medications    Prior to Admission medications   Medication Sig Start Date End Date Taking? Authorizing Provider  benzonatate (TESSALON) 100 MG  capsule Take 1 capsule (100 mg total) by mouth every 8 (eight) hours as needed for cough. 07/04/21  Yes Odis Luster, FNP  cefdinir (OMNICEF) 300 MG capsule Take 1 capsule (300 mg total) by mouth 2 (two) times daily for 10 days. 07/04/21 07/14/21 Yes Odis Luster, FNP  enoxaparin  (LOVENOX) 150 MG/ML injection Inject 0.86 mLs (130 mg total) into the skin every 12 (twelve) hours. 05/24/21  Yes Orson Slick, MD  magic mouthwash (lidocaine, diphenhydrAMINE, alum & mag hydroxide) suspension Swish and spit 5 mLs 3 (three) times daily as needed for mouth pain. 06/24/21  Yes Ripley Fraise, MD  methylphenidate 27 MG PO CR tablet Take 27 mg by mouth every morning.   Yes [provider]  predniSONE (DELTASONE) 20 MG tablet Take 2 tablets (40 mg total) by mouth daily for 5 days. 07/04/21 07/09/21 Yes Odis Luster, FNP  buPROPion (WELLBUTRIN XL) 150 MG 24 hr tablet Take 150 mg by mouth daily.    [provider]  cetirizine (ZYRTEC) 10 MG tablet     [provider]    Family History Family History  Adopted: Yes  Problem Relation Age of Onset   Venous thrombosis Neg Hx     Social History Social History   Tobacco Use   Smoking status: Former    Types: E-cigarettes, Cigarettes   Smokeless tobacco: Never  Vaping Use   Vaping Use: Former  Substance Use Topics   Alcohol use: Not Currently   Drug use: Not Currently     Allergies   Patient has no known allergies.   Review of Systems Review of Systems Per HPI  Physical Exam Triage Vital Signs ED Triage Vitals  Enc Vitals Group     BP 07/04/21 1722 128/85     Pulse Rate 07/04/21 1722 94     Resp 07/04/21 1722 (!) 24     Temp 07/04/21 1722 99 F (37.2 C)     Temp Source 07/04/21 1722 Oral     SpO2 07/04/21 1722 97 %     Weight 07/04/21 1724 (!) 315 lb (142.9 kg)     Height 07/04/21 1724 '6\' 4"'$  (1.93 m)     Head Circumference --      Peak Flow --      Pain Score 07/04/21 1724 10     Pain Loc --      Pain Edu? --      Excl. in [place]? --    No data found.  Updated Vital Signs BP 128/85 (BP Location: Left Arm)   Pulse 94   Temp 99 F (37.2 C) (Oral)   Resp (!) 24   Ht '6\' 4"'$  (1.93 m)   Wt (!) 315 lb (142.9 kg)   SpO2 97%   BMI 38.34 kg/m   Visual Acuity Right Eye  Distance:   Left Eye Distance:   Bilateral Distance:    Right Eye Near:   Left Eye Near:    Bilateral Near:     Physical Exam Constitutional:      General: He is not in acute distress.    Appearance: Normal appearance.  HENT:     Head: Normocephalic and atraumatic.     Right Ear: Ear canal normal. Tympanic membrane is erythematous and bulging.     Left Ear: Ear canal normal. Tympanic membrane is erythematous.     Nose: Congestion present.     Mouth/Throat:     Mouth: Mucous membranes are moist.  Pharynx: Posterior oropharyngeal erythema present.  Eyes:     Extraocular Movements: Extraocular movements intact.     Conjunctiva/sclera: Conjunctivae normal.     Pupils: Pupils are equal, round, and reactive to light.  Cardiovascular:     Rate and Rhythm: Normal rate and regular rhythm.     Pulses: Normal pulses.     Heart sounds: Normal heart sounds.  Pulmonary:     Effort: Pulmonary effort is normal. No respiratory distress.     Breath sounds: Normal breath sounds. No wheezing, rhonchi or rales.  Abdominal:     General: Abdomen is flat. Bowel sounds are normal.     Palpations: Abdomen is soft.  Musculoskeletal:        General: Normal range of motion.     Cervical back: Normal range of motion.  Skin:    General: Skin is warm and dry.  Neurological:     General: No focal deficit present.     Mental Status: He is alert and oriented to person, place, and time. Mental status is at baseline.  Psychiatric:        Mood and Affect: Mood normal.        Behavior: Behavior normal.     UC Treatments / Results  Labs (all labs ordered are listed, but only abnormal results are displayed) Labs Reviewed - No data to display  EKG   Radiology DG Chest 2 View  Result Date: 07/04/2021 CLINICAL DATA:  Persistent cough EXAM: CHEST - 2 VIEW COMPARISON:  06/24/2021 FINDINGS: The heart size and mediastinal contours are within normal limits. Both lungs are clear. The visualized skeletal  structures are unremarkable. IMPRESSION: Normal study. Electronically Signed   By: Rolm Baptise M.D.   On: 07/04/2021 18:27    Procedures Procedures (including critical care time)  Medications Ordered in UC Medications - No data to display  Initial Impression / Assessment and Plan / UC Course  I have reviewed the triage vital signs and the nursing notes.  Pertinent labs & imaging results that were available during my care of the patient were reviewed by me and considered in my medical decision making (see chart for details).     Chest x-ray was negative in urgent care for any acute cardiopulmonary process.  Will treat bilateral ear infection and upper respiratory infection with cefdinir antibiotic.  Also prescribed prednisone steroid to decrease inflammation and help with cough.  Benzonatate prescribed as needed for cough.  Advised patient to go to the hospital if chest pain or shortness of breath develop.  Patient to follow-up with PCP or urgent care if symptoms do not improve.  Patient is not in any respiratory distress and vital signs are stable, so do not think emergent medical intervention at the hospital is necessary at this time.  Discussed strict return precautions. Patient verbalized understanding and is agreeable with plan.  Final Clinical Impressions(s) / UC Diagnoses   Final diagnoses:  Acute upper respiratory infection  Persistent cough for 3 weeks or longer  Other non-recurrent acute nonsuppurative otitis media of both ears     Discharge Instructions      You are being treated for bilateral ear infection with cefdinir antibiotic.  This antibiotic will also treat upper respiratory infection.  You have also been prescribed prednisone steroid to decrease inflammation in chest and help with cough.  Benzonatate has been prescribed to take as needed for cough.  You may also try Mucinex over-the-counter for cough.  Please go to the hospital if  shortness of breath develops or if  chest pain develops.     ED Prescriptions     Medication Sig Dispense Auth. Provider   cefdinir (OMNICEF) 300 MG capsule Take 1 capsule (300 mg total) by mouth 2 (two) times daily for 10 days. 20 capsule Odis Luster, FNP   predniSONE (DELTASONE) 20 MG tablet Take 2 tablets (40 mg total) by mouth daily for 5 days. 10 tablet Odis Luster, FNP   benzonatate (TESSALON) 100 MG capsule Take 1 capsule (100 mg total) by mouth every 8 (eight) hours as needed for cough. 21 capsule Odis Luster, FNP      PDMP not reviewed this encounter.   Odis Luster, FNP 07/04/21 1950

## 2021-09-05 DIAGNOSIS — F902 Attention-deficit hyperactivity disorder, combined type: Secondary | ICD-10-CM | POA: Diagnosis not present

## 2021-09-08 ENCOUNTER — Telehealth: Payer: Self-pay | Admitting: *Deleted

## 2021-09-08 ENCOUNTER — Other Ambulatory Visit: Payer: Self-pay | Admitting: *Deleted

## 2021-09-08 MED ORDER — ENOXAPARIN SODIUM 150 MG/ML IJ SOSY
130.0000 mg | PREFILLED_SYRINGE | Freq: Two times a day (BID) | INTRAMUSCULAR | 4 refills | Status: DC
Start: 2021-09-08 — End: 2021-11-15

## 2021-09-08 NOTE — Telephone Encounter (Signed)
Received vm message from pt stating that he has new insurance and would like a 62month supply of his Lovenox. This was refilled per ok from Dr. Penelope Galas. Pt made aware. Instructed to bring new insurance card toi his next appt here. In December.

## 2021-09-21 ENCOUNTER — Encounter: Payer: Self-pay | Admitting: Nurse Practitioner

## 2021-09-21 ENCOUNTER — Ambulatory Visit (INDEPENDENT_AMBULATORY_CARE_PROVIDER_SITE_OTHER): Payer: BC Managed Care – PPO | Admitting: Nurse Practitioner

## 2021-09-21 ENCOUNTER — Other Ambulatory Visit: Payer: Self-pay

## 2021-09-21 VITALS — BP 103/72 | HR 91 | Temp 98.0°F | Ht 76.0 in | Wt 302.8 lb

## 2021-09-21 DIAGNOSIS — D689 Coagulation defect, unspecified: Secondary | ICD-10-CM

## 2021-09-21 DIAGNOSIS — Z6836 Body mass index (BMI) 36.0-36.9, adult: Secondary | ICD-10-CM

## 2021-09-21 DIAGNOSIS — Z7689 Persons encountering health services in other specified circumstances: Secondary | ICD-10-CM | POA: Diagnosis not present

## 2021-09-21 DIAGNOSIS — J309 Allergic rhinitis, unspecified: Secondary | ICD-10-CM

## 2021-09-21 NOTE — Progress Notes (Signed)
New Patient Office Visit  Subjective:  Patient ID: Noah Moore, male    DOB: 07-12-1991  Age: 30 y.o. MRN: [id number]  CC:  Chief Complaint  Patient presents with   New Patient (Initial Visit)    HPI Noah Moore presents to establish new primary care provider. [place] Finding a provider closer to home. States that he recently got over episode of food poisoning. Does have ear ache In both ears.  States that ears have been hurting for about two days. Denies headache, chills, or fever. Does have some congestion. Infant daughter recently diagnosed with ear infection. States that he does have history of clotting disorder. Is on the waiting list at [facility] for further evaluation. Was seeing a hematologist, now going a little less frequently. Currently taking lovenox injections every 12 hours. Also sees cardio and vascular providers routinely.   Past Medical History:  Diagnosis Date   Allergy    DVT (deep venous thrombosis) (HCC)    DVT (deep venous thrombosis) ([place]) 02/2020   GERD (gastroesophageal reflux disease)     Past Surgical History:  Procedure Laterality Date   INTRAVASCULAR ULTRASOUND/IVUS N/A 03/30/2020   Procedure: INTRAVASCULAR ULTRASOUND/IVUS;  Surgeon: Marty Heck, MD;  Location: [place] [facility];  Service: Cardiovascular;  Laterality: N/A;   INTRAVASCULAR ULTRASOUND/IVUS N/A 06/24/2020   Procedure: Intravascular Ultrasound/IVUS;  Surgeon: Marty Heck, MD;  Location: [place] [facility];  Service: Cardiovascular;  Laterality: N/A;   IVC VENOGRAPHY N/A 03/30/2020   Procedure: IVC Venography;  Surgeon: Marty Heck, MD;  Location: [place] [facility];  Service: Cardiovascular;  Laterality: N/A;   KNEE SURGERY Right    LEG SURGERY     Reports history of right lower extremity tibia fracture in 2010 which was repaired surgically.   LOWER EXTREMITY VENOGRAPHY Left 03/30/2020   Procedure: LOWER  EXTREMITY VENOGRAPHY;  Surgeon: Marty Heck, MD;  Location: [place] [facility];  Service: Cardiovascular;  Laterality: Left;   LOWER EXTREMITY VENOGRAPHY  06/23/2020   Procedure: LOWER EXTREMITY VENOGRAPHY;  Surgeon: Marty Heck, MD;  Location: [place] [facility];  Service: Cardiovascular;;   PERIPHERAL VASCULAR THROMBECTOMY Left 03/30/2020   Procedure: PERIPHERAL VASCULAR THROMBECTOMY;  Surgeon: Marty Heck, MD;  Location: [place] [facility];  Service: Cardiovascular;  Laterality: Left;   PERIPHERAL VASCULAR THROMBECTOMY N/A 06/24/2020   Procedure: PERIPHERAL VASCULAR THROMBECTOMY;  Surgeon: Marty Heck, MD;  Location: [place] [facility];  Service: Cardiovascular;  Laterality: N/A;   THROMBECTOMY  03/2020   ULTRASOUND GUIDANCE FOR VASCULAR ACCESS N/A 07/28/2020   Procedure: ULTRASOUND GUIDANCE ACCESS OF RIGHT INTERNAL JUGULAR, ULTRASOUND GUIDANCE ACCESS OF POSTERIOR TIBIAL VEIN;  Surgeon: Waynetta Sandy, MD;  Location: [place];  Service: Vascular;  Laterality: N/A;   VENOGRAM Left 07/28/2020   Procedure: CENTRAL VENOGRAM AND LEFT LOWER EXTREMITY VENOGRAM;  Surgeon: Waynetta Sandy, MD;  Location: [place];  Service: Vascular;  Laterality: Left;   WISDOM TOOTH EXTRACTION      Family History  Adopted: Yes  Problem Relation Age of Onset   Alcoholism Mother    Diabetes Mother    High blood pressure Mother    Alcoholism Father    Cancer Maternal Grandmother    Venous thrombosis Neg Hx     Social History   Socioeconomic History   Marital status: Single    Spouse name: Not on file   Number of children: Not on  file   Years of education: Not on file   Highest education level: Not on file  Occupational History   Not on file  Tobacco Use   Smoking status: Former    Types: E-cigarettes, Cigarettes   Smokeless tobacco: Never  Vaping Use   Vaping Use: Former  Substance and Sexual Activity   Alcohol use: Not Currently   Drug use: Not Currently    Sexual activity: Yes    Birth control/protection: None  Other Topics Concern   Not on file  Social History Narrative   ** Merged History Encounter **       Social Determinants of Health   Financial Resource Strain: Not on file  Food Insecurity: Not on file  Transportation Needs: Not on file  Physical Activity: Not on file  Stress: Not on file  Social Connections: Not on file  Intimate Partner Violence: Not on file    ROS Review of Systems  Constitutional:  Positive for fatigue. Negative for activity change, chills and fever.  HENT:  Positive for congestion and ear pain. Negative for postnasal drip, rhinorrhea, sinus pressure, sinus pain, sneezing and sore throat.   Eyes: Negative.   Respiratory:  Negative for cough, shortness of breath and wheezing.   Cardiovascular:  Negative for chest pain and palpitations.  Gastrointestinal:  Negative for constipation, diarrhea, nausea and vomiting.  Endocrine: Negative for cold intolerance, heat intolerance, polydipsia and polyuria.  Genitourinary:  Negative for dysuria, frequency and urgency.  Musculoskeletal:  Negative for back pain and myalgias.  Skin:  Negative for rash.  Allergic/Immunologic: Positive for environmental allergies and food allergies.  Neurological:  Negative for dizziness, weakness and headaches.  Hematological:        History of clotting disorders.  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  Psychiatric/Behavioral:  The patient is not nervous/anxious.    Objective:   Today's Vitals   09/21/21 1506  BP: 103/72  Pulse: 91  Temp: 98 F (36.7 C)  SpO2: 98%  Weight: (!) 302 lb 12.8 oz (137.3 kg)  Height: 6\' 4"  (1.93 m)   Body mass index is 36.86 kg/m.   Physical Exam Vitals and nursing note reviewed.  Constitutional:      Appearance: Normal appearance. He is well-developed. He is obese.  HENT:     Head: Normocephalic and atraumatic.     Right Ear: Hearing, tympanic membrane, ear canal and external ear  normal.     Left Ear: Hearing, tympanic membrane, ear canal and external ear normal.     Mouth/Throat:     Mouth: Mucous membranes are moist.     Pharynx: Oropharynx is clear.  Eyes:     General: No scleral icterus.    Conjunctiva/sclera: Conjunctivae normal.     Pupils: Pupils are equal, round, and reactive to light.  Cardiovascular:     Rate and Rhythm: Normal rate and regular rhythm.     Pulses: Normal pulses.     Heart sounds: Normal heart sounds.  Pulmonary:     Effort: Pulmonary effort is normal.     Breath sounds: Normal breath sounds.  Abdominal:     Palpations: Abdomen is soft.  Musculoskeletal:        General: Normal range of motion.     Cervical back: Normal range of motion and neck supple.  Lymphadenopathy:     Cervical: No cervical adenopathy.  Skin:    General: Skin is warm and dry.     Capillary Refill: Capillary refill  takes less than 2 seconds.  Neurological:     General: No focal deficit present.     Mental Status: He is alert and oriented to person, place, and time.  Psychiatric:        Mood and Affect: Mood normal.        Behavior: Behavior normal.        Thought Content: Thought content normal.        Judgment: Judgment normal.    Assessment & Plan:  1. Encounter to establish care Appointment today to establish new primary care provider    2. Chronic allergic rhinitis Patient with chronic allergic rhinitis though unsure of etiology and cause.  Refer to allergy and immunology for further evaluation and treatment. - Ambulatory referral to Immunology  3. Body mass index (BMI) of 36.0-36.9 in adult Encourage patient to limit calorie intake to 2000 cal/day or less.  He should consume a low cholesterol, low-fat diet.  Patient incorporate exercise into his daily routine.   4. Blood clotting disorder [facility]) Patient currently seeing hematologist.  Takes Lovenox twice daily.  Currently on waiting list for [place] [facility] for further evaluation and  treatment.  Problem List Items Addressed This Visit       Respiratory   Chronic allergic rhinitis   Relevant Orders   Ambulatory referral to Immunology     Hematopoietic and Hemostatic   Blood clotting disorder (HCC)     Other   Body mass index (BMI) of 36.0-36.9 in adult   Other Visit Diagnoses     Encounter to establish care    -  Primary       Outpatient Encounter Medications as of 09/21/2021  Medication Sig   cetirizine (ZYRTEC) 10 MG tablet    enoxaparin (LOVENOX) 150 MG/ML injection Inject 0.86 mLs (130 mg total) into the skin every 12 (twelve) hours. Please refill with 90 day supply   magic mouthwash (lidocaine, diphenhydrAMINE, alum & mag hydroxide) suspension Swish and spit 5 mLs 3 (three) times daily as needed for mouth pain.   benzonatate (TESSALON) 100 MG capsule Take 1 capsule (100 mg total) by mouth every 8 (eight) hours as needed for cough. (Patient not taking: Reported on 09/21/2021)   buPROPion (WELLBUTRIN XL) 150 MG 24 hr tablet Take 150 mg by mouth daily. (Patient not taking: Reported on 09/21/2021)   methylphenidate 27 MG PO CR tablet Take 27 mg by mouth every morning. (Patient not taking: Reported on 09/21/2021)   No facility-administered encounter medications on file as of 09/21/2021.    Follow-up: Return in about 6 weeks (around 11/02/2021) for health maintenance exam, FBW a week prior to visit.   Ronnell Freshwater, NP  This note was dictated using Systems analyst. Rapid proofreading was performed to expedite the delivery of the information. Despite proofreading, phonetic errors will occur which are common with this voice recognition software. Please take this into consideration. If there are any concerns, please contact our office.

## 2021-10-01 DIAGNOSIS — D689 Coagulation defect, unspecified: Secondary | ICD-10-CM | POA: Insufficient documentation

## 2021-10-01 DIAGNOSIS — J309 Allergic rhinitis, unspecified: Secondary | ICD-10-CM | POA: Insufficient documentation

## 2021-10-01 DIAGNOSIS — Z6836 Body mass index (BMI) 36.0-36.9, adult: Secondary | ICD-10-CM | POA: Insufficient documentation

## 2021-10-01 NOTE — Patient Instructions (Signed)
Fat and Cholesterol Restricted Eating Plan Getting too much fat and cholesterol in your diet may cause health problems. Choosing the right foods helps keep your fat and cholesterol at normal levels. This can keep you from getting certain diseases. Your doctor may recommend an eating plan that includes: Total fat: ______% or less of total calories a day. This is ______g of fat a day. Saturated fat: ______% or less of total calories a day. This is ______g of saturated fat a day. Cholesterol: less than _________mg a day. Fiber: ______g a day. What are tips for following this plan? General tips Work with your doctor to lose weight if you need to. Avoid: Foods with added sugar. Fried foods. Foods with trans fat or partially hydrogenated oils. This includes some margarines and baked goods. If you drink alcohol: Limit how much you have to: 0-1 drink a day for women who are not pregnant. 0-2 drinks a day for men. Know how much alcohol is in a drink. In the [place] one drink equals one 12 oz bottle of beer (355 mL), one 5 oz glass of wine (148 mL), or one 1 oz glass of hard liquor (44 mL). Reading food labels Check food labels for: Trans fats. Partially hydrogenated oils. Saturated fat (g) in each serving. Cholesterol (mg) in each serving. Fiber (g) in each serving. Choose foods with healthy fats, such as: Monounsaturated fats and polyunsaturated fats. These include olive and canola oil, flaxseeds, walnuts, almonds, and seeds. Omega-3 fats. These are found in certain fish, flaxseed oil, and ground flaxseeds. Choose grain products that have whole grains. Look for the word "whole" as the first word in the ingredient list. Cooking Cook foods using low-fat methods. These include baking, boiling, grilling, and broiling. Eat more home-cooked foods. Eat at restaurants and buffets less often. Eat less fast food. Avoid cooking using saturated fats, such as butter, cream, palm oil, palm kernel oil, and  coconut oil. Meal planning  At meals, divide your plate into four equal parts: Fill one-half of your plate with vegetables, green salads, and fruit. Fill one-fourth of your plate with whole grains. Fill one-fourth of your plate with low-fat (lean) protein foods. Eat fish that is high in omega-3 fats at least two times a week. This includes mackerel, tuna, sardines, and salmon. Eat foods that are high in fiber, such as whole grains, beans, apples, pears, berries, broccoli, carrots, peas, and barley. What foods should I eat? Fruits All fresh, canned (in natural juice), or frozen fruits. Vegetables Fresh or frozen vegetables (raw, steamed, roasted, or grilled). Green salads. Grains Whole grains, such as whole wheat or whole grain breads, crackers, cereals, and pasta. Unsweetened oatmeal, bulgur, barley, quinoa, or brown rice. Corn or whole wheat flour tortillas. Meats and other protein foods Ground beef (85% or leaner), grass-fed beef, or beef trimmed of fat. Skinless chicken or turkey. Ground chicken or turkey. Pork trimmed of fat. All fish and seafood. Egg whites. Dried beans, peas, or lentils. Unsalted nuts or seeds. Unsalted canned beans. Nut butters without added sugar or oil. Dairy Low-fat or nonfat dairy products, such as skim or 1% milk, 2% or reduced-fat cheeses, low-fat and fat-free ricotta or cottage cheese, or plain low-fat and nonfat yogurt. Fats and oils Tub margarine without trans fats. Light or reduced-fat mayonnaise and salad dressings. Avocado. Olive, canola, sesame, or safflower oils. The items listed above may not be a complete list of foods and beverages you can eat. Contact a dietitian for more information. What foods   should I avoid? Fruits Canned fruit in heavy syrup. Fruit in cream or butter sauce. Fried fruit. Vegetables Vegetables cooked in cheese, cream, or butter sauce. Fried vegetables. Grains White bread. White pasta. White rice. Cornbread. Bagels, pastries,  and croissants. Crackers and snack foods that contain trans fat and hydrogenated oils. Meats and other protein foods Fatty cuts of meat. Ribs, chicken wings, bacon, sausage, bologna, salami, chitterlings, fatback, hot dogs, bratwurst, and packaged lunch meats. Liver and organ meats. Whole eggs and egg yolks. Chicken and turkey with skin. Fried meat. Dairy Whole or 2% milk, cream, half-and-half, and cream cheese. Whole milk cheeses. Whole-fat or sweetened yogurt. Full-fat cheeses. Nondairy creamers and whipped toppings. Processed cheese, cheese spreads, and cheese curds. Fats and oils Butter, stick margarine, lard, shortening, ghee, or bacon fat. Coconut, palm kernel, and palm oils. Beverages Alcohol. Sugar-sweetened drinks such as sodas, lemonade, and fruit drinks. Sweets and desserts Corn syrup, sugars, honey, and molasses. Candy. Jam and jelly. Syrup. Sweetened cereals. Cookies, pies, cakes, donuts, muffins, and ice cream. The items listed above may not be a complete list of foods and beverages you should avoid. Contact a dietitian for more information. Summary Choosing the right foods helps keep your fat and cholesterol at normal levels. This can keep you from getting certain diseases. At meals, fill one-half of your plate with vegetables, green salads, and fruits. Eat high fiber foods, like whole grains, beans, apples, pears, berries, carrots, peas, and barley. Limit added sugar, saturated fats, alcohol, and fried foods. This information is not intended to replace advice given to you by your health care provider. Make sure you discuss any questions you have with your health care provider. Document Revised: 03/24/2021 Document Reviewed: 03/24/2021 Elsevier Patient Education  2022 Elsevier Inc.  

## 2021-10-06 DIAGNOSIS — F902 Attention-deficit hyperactivity disorder, combined type: Secondary | ICD-10-CM | POA: Diagnosis not present

## 2021-10-10 ENCOUNTER — Ambulatory Visit: Payer: BC Managed Care – PPO | Admitting: Vascular Surgery

## 2021-10-24 ENCOUNTER — Ambulatory Visit: Payer: BC Managed Care – PPO | Admitting: Vascular Surgery

## 2021-10-31 ENCOUNTER — Ambulatory Visit: Payer: BC Managed Care – PPO | Admitting: Vascular Surgery

## 2021-11-07 ENCOUNTER — Ambulatory Visit: Payer: BC Managed Care – PPO | Admitting: Vascular Surgery

## 2021-11-15 ENCOUNTER — Other Ambulatory Visit: Payer: Self-pay | Admitting: Hematology and Oncology

## 2021-11-15 ENCOUNTER — Inpatient Hospital Stay: Payer: BC Managed Care – PPO | Attending: Hematology and Oncology

## 2021-11-15 ENCOUNTER — Other Ambulatory Visit: Payer: Self-pay

## 2021-11-15 ENCOUNTER — Inpatient Hospital Stay (HOSPITAL_BASED_OUTPATIENT_CLINIC_OR_DEPARTMENT_OTHER): Payer: BC Managed Care – PPO | Admitting: Hematology and Oncology

## 2021-11-15 VITALS — BP 139/98 | HR 89 | Temp 97.2°F | Resp 18 | Wt 301.2 lb

## 2021-11-15 DIAGNOSIS — Z7901 Long term (current) use of anticoagulants: Secondary | ICD-10-CM | POA: Diagnosis not present

## 2021-11-15 DIAGNOSIS — Z86718 Personal history of other venous thrombosis and embolism: Secondary | ICD-10-CM | POA: Diagnosis not present

## 2021-11-15 DIAGNOSIS — D6862 Lupus anticoagulant syndrome: Secondary | ICD-10-CM | POA: Insufficient documentation

## 2021-11-15 DIAGNOSIS — I824Z2 Acute embolism and thrombosis of unspecified deep veins of left distal lower extremity: Secondary | ICD-10-CM

## 2021-11-15 DIAGNOSIS — Z79899 Other long term (current) drug therapy: Secondary | ICD-10-CM | POA: Insufficient documentation

## 2021-11-15 DIAGNOSIS — I8290 Acute embolism and thrombosis of unspecified vein: Secondary | ICD-10-CM

## 2021-11-15 LAB — CMP (CANCER CENTER ONLY)
ALT: 14 U/L (ref 0–44)
AST: 11 U/L — ABNORMAL LOW (ref 15–41)
Albumin: 4.6 g/dL (ref 3.5–5.0)
Alkaline Phosphatase: 88 U/L (ref 38–126)
Anion gap: 7 (ref 5–15)
BUN: 6 mg/dL (ref 6–20)
CO2: 27 mmol/L (ref 22–32)
Calcium: 9.5 mg/dL (ref 8.9–10.3)
Chloride: 105 mmol/L (ref 98–111)
Creatinine: 1.02 mg/dL (ref 0.61–1.24)
GFR, Estimated: >60 mL/min (ref >60)
Glucose, Bld: 94 mg/dL (ref 70–99)
Potassium: 3.8 mmol/L (ref 3.5–5.1)
Sodium: 139 mmol/L (ref 135–145)
Total Bilirubin: 0.7 mg/dL (ref 0.3–1.2)
Total Protein: 8 g/dL (ref 6.5–8.1)

## 2021-11-15 LAB — CBC WITH DIFFERENTIAL (CANCER CENTER ONLY)
Abs Immature Granulocytes: 0.03 10*3/uL (ref 0.00–0.07)
Basophils Absolute: 0.1 10*3/uL (ref 0.0–0.1)
Basophils Relative: 1 %
Eosinophils Absolute: 0.1 10*3/uL (ref 0.0–0.5)
Eosinophils Relative: 1 %
HCT: 49.5 % (ref 39.0–52.0)
Hemoglobin: 15.9 g/dL (ref 13.0–17.0)
Immature Granulocytes: 0 %
Lymphocytes Relative: 25 %
Lymphs Abs: 2.3 10*3/uL (ref 0.7–4.0)
MCH: 28.3 pg (ref 26.0–34.0)
MCHC: 32.1 g/dL (ref 30.0–36.0)
MCV: 88.2 fL (ref 80.0–100.0)
Monocytes Absolute: 0.8 10*3/uL (ref 0.1–1.0)
Monocytes Relative: 9 %
Neutro Abs: 5.7 10*3/uL (ref 1.7–7.7)
Neutrophils Relative %: 64 %
Platelet Count: 353 10*3/uL (ref 150–400)
RBC: 5.61 MIL/uL (ref 4.22–5.81)
RDW: 13.5 % (ref 11.5–15.5)
WBC Count: 8.9 10*3/uL (ref 4.0–10.5)
nRBC: 0 % (ref 0.0–0.2)

## 2021-11-15 MED ORDER — ENOXAPARIN SODIUM 150 MG/ML IJ SOSY: 130.0000 mg | PREFILLED_SYRINGE | Freq: Two times a day (BID) | INTRAMUSCULAR | 4 refills | Status: DC

## 2021-11-15 NOTE — Progress Notes (Signed)
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 [place] LE showed acute deep vein thrombosis involving the left common femoral vein, left femoral vein, and left popliteal vein. Started on anticoagulation therapy. Admitted 4/21-4/24/2021. D/c on Xarelto 2) 03/17/2020: CTA shows no evidence of PE 3) 03/30/2020: patient presented to ED with worsening pain. Admitted again and underwent peripheral vascular thrombectomy. Transitioned to Apixaban. 4) 04/11/2020: establish care with Dr. Lorenso Courier  5) 06/21/2020-06/26/2020: developed new VTEs requiring hospitalization and thrombolysis/mechanical thrombectomy of the left external iliac vein, common iliac vein, and IVC with vascular surgery on 06/23/2020. He was transitioned to coumadin at that time. 6) 07/04/2020: patient therapeutic on coumadin at INR 2.3, underwent US lower extremity with showed new thrombus in the distal IVC, external iliac and internal iliac veins. 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 male with medical history significant for extensive provoked LLE DVT who presents for a follow up visit. The patient's last visit was on 05/24/2021. In the interim since the last visit Noah Moore has continued lovenox therapy without difficulty.  On exam today Noah Moore reports he has been well overall  in the interim since her last visit.  Unfortunately he has frequent missed doses of his Lovenox.  He reports he missed about 3-4 doses per week.  He also recently started on Adderall 25 mg p.o. daily.  He notes that about 2 to 3 weeks ago he had an episode where he felt incredibly short of breath and felt he was having "heart attack".  He notes that EMS came and wanted to take him to the emergency department but he did not wish to go.  He has not had any further symptoms like this.  He reports that he is not having a bleeding, bruising, or dark stools.  He denies any other overt signs of bleeding other than the bruising of his abdomen where he receives Lovenox shot.  He notes otherwise he has been well and has not been having any issues with fevers, chills, sweats, nausea, vomiting or diarrhea.  A full 10 point ROS is listed below.   MEDICAL HISTORY:  Past Medical History:  Diagnosis Date   Allergy    DVT (deep venous thrombosis) (HCC)    DVT (deep venous thrombosis) ([place]) 02/2020   GERD (gastroesophageal reflux disease)     SURGICAL HISTORY: Past Surgical History:  Procedure Laterality Date   INTRAVASCULAR ULTRASOUND/IVUS N/A 03/30/2020   Procedure: INTRAVASCULAR ULTRASOUND/IVUS;  Surgeon: Marty Heck, MD;  Location: [place] [facility];  Service: Cardiovascular;  Laterality: N/A;   INTRAVASCULAR ULTRASOUND/IVUS N/A 06/24/2020   Procedure: Intravascular Ultrasound/IVUS;  Surgeon: Marty Heck, MD;  Location: [place] [facility];  Service: Cardiovascular;  Laterality: N/A;   IVC VENOGRAPHY N/A 03/30/2020   Procedure: IVC Venography;  Surgeon: Marty Heck, MD;  Location: [place] [facility];  Service: Cardiovascular;  Laterality: N/A;   KNEE SURGERY Right    LEG SURGERY  Reports history of right lower extremity tibia fracture in 2010 which was repaired surgically.   LOWER EXTREMITY VENOGRAPHY Left 03/30/2020   Procedure: LOWER EXTREMITY VENOGRAPHY;  Surgeon: Marty Heck, MD;  Location: [place] [facility];  Service: Cardiovascular;  Laterality: Left;   LOWER EXTREMITY VENOGRAPHY  06/23/2020   Procedure: LOWER EXTREMITY VENOGRAPHY;  Surgeon: Marty Heck, MD;  Location: [place] [facility];  Service: Cardiovascular;;   PERIPHERAL VASCULAR THROMBECTOMY Left 03/30/2020   Procedure: PERIPHERAL VASCULAR THROMBECTOMY;  Surgeon: Marty Heck, MD;  Location: [place] [facility];  Service: Cardiovascular;  Laterality: Left;   PERIPHERAL VASCULAR THROMBECTOMY N/A 06/24/2020   Procedure: PERIPHERAL VASCULAR THROMBECTOMY;  Surgeon: Marty Heck, MD;  Location: [place] [facility];  Service: Cardiovascular;  Laterality: N/A;   THROMBECTOMY  03/2020   ULTRASOUND GUIDANCE FOR VASCULAR ACCESS N/A 07/28/2020   Procedure: ULTRASOUND GUIDANCE ACCESS OF RIGHT INTERNAL JUGULAR, ULTRASOUND GUIDANCE ACCESS OF POSTERIOR TIBIAL VEIN;  Surgeon: Waynetta Sandy, MD;  Location: [place];  Service: Vascular;  Laterality: N/A;   VENOGRAM Left 07/28/2020   Procedure: CENTRAL VENOGRAM AND LEFT LOWER EXTREMITY VENOGRAM;  Surgeon: Waynetta Sandy, MD;  Location: [place];  Service: Vascular;  Laterality: Left;   WISDOM TOOTH EXTRACTION      SOCIAL HISTORY: Social History   Socioeconomic History   Marital status: Single    Spouse name: Not on file   Number of children: Not on file   Years of education: Not on file   Highest education level: Not on file  Occupational History   Not on file  Tobacco Use   Smoking status: Former    Types: E-cigarettes, Cigarettes   Smokeless tobacco: Never  Vaping Use   Vaping Use: Former  Substance and Sexual Activity   Alcohol use: Not Currently   Drug use: Not Currently   Sexual activity: Yes    Birth control/protection: None  Other Topics Concern   Not on file  Social History Narrative   ** Merged History Encounter **       Social Determinants of Health   Financial Resource Strain: Not on file   Food Insecurity: Not on file  Transportation Needs: Not on file  Physical Activity: Not on file  Stress: Not on file  Social Connections: Not on file  Intimate Partner Violence: Not on file    FAMILY HISTORY: Family History  Adopted: Yes  Problem Relation Age of Onset   Alcoholism Mother    Diabetes Mother    High blood pressure Mother    Alcoholism Father    Cancer Maternal Grandmother    Venous thrombosis Neg Hx     ALLERGIES:  has No Known Allergies.  MEDICATIONS:  Current Outpatient Medications  Medication Sig Dispense Refill   Amphetamine-Dextroamphetamine (ADDERALL XR PO) Take 25 mg by mouth.     benzonatate (TESSALON) 100 MG capsule Take 1 capsule (100 mg total) by mouth every 8 (eight) hours as needed for cough. (Patient not taking: Reported on 09/21/2021) 21 capsule 0   buPROPion (WELLBUTRIN XL) 150 MG 24 hr tablet Take 150 mg by mouth daily. (Patient not taking: Reported on 09/21/2021)     cetirizine (ZYRTEC) 10 MG tablet      enoxaparin (LOVENOX) 150 MG/ML injection Inject 0.86 mLs (130 mg total) into the skin every 12 (twelve) hours. Please refill with 90 day supply 60 mL 4   magic mouthwash (lidocaine, diphenhydrAMINE, alum & mag hydroxide) suspension Swish and spit  5 mLs 3 (three) times daily as needed for mouth pain. 360 mL 0   methylphenidate 27 MG PO CR tablet Take 27 mg by mouth every morning. (Patient not taking: Reported on 09/21/2021)     No current facility-administered medications for this visit.    REVIEW OF SYSTEMS:   Constitutional: ( - ) fevers, ( - )  chills , ( - ) night sweats Eyes: ( - ) blurriness of vision, ( - ) double vision, ( - ) watery eyes Ears, nose, mouth, throat, and face: ( - ) mucositis, ( - ) sore throat Respiratory: ( - ) cough, ( - ) dyspnea, ( - ) wheezes Cardiovascular: ( - ) palpitation, ( - ) chest discomfort, ( - ) lower extremity swelling Gastrointestinal:  ( - ) nausea, ( - ) heartburn, ( - ) change in bowel  habits Skin: ( - ) abnormal skin rashes Lymphatics: ( - ) new lymphadenopathy, ( - ) easy bruising Neurological: ( - ) numbness, ( - ) tingling, ( - ) new weaknesses Behavioral/Psych: ( - ) mood change, ( - ) new changes  All other systems were reviewed with the patient and are negative.  PHYSICAL EXAMINATION: ECOG PERFORMANCE STATUS: 0 - Asymptomatic  Vitals:   11/15/21 1040  BP: (!) 139/98  Pulse: 89  Resp: 18  Temp: (!) 97.2 F (36.2 C)  SpO2: 97%   Filed Weights   11/15/21 1040  Weight: (!) 301 lb 3.2 oz (136.6 kg)    GENERAL: well appearing young male,  alert, no distress and comfortable SKIN: No skin lesions noted.  EYES: conjunctiva are pink and non-injected, sclera clear LUNGS: clear to auscultation and percussion with normal breathing effort HEART: regular rate & rhythm and no murmurs. Musculoskeletal: no cyanosis of digits and no clubbing. No LE edema. No erythema or tenderness of the LLE.  PSYCH: alert & oriented x 3, fluent speech NEURO: no focal motor/sensory deficits  LABORATORY DATA:  I have reviewed the data as listed CBC Latest Ref Rng & Units 11/15/2021 06/24/2021 05/24/2021  WBC 4.0 - 10.5 K/uL 8.9 13.4(H) 8.4  Hemoglobin 13.0 - 17.0 g/dL 15.9 14.5 14.8  Hematocrit 39.0 - 52.0 % 49.5 44.1 44.5  Platelets 150 - 400 K/uL 353 280 279    CMP Latest Ref Rng & Units 11/15/2021 06/24/2021 05/24/2021  Glucose 70 - 99 mg/dL 94 97 89  BUN 6 - 20 mg/dL 6 5(L) 6  Creatinine 0.61 - 1.24 mg/dL 1.02 0.91 1.09  Sodium 135 - 145 mmol/L 139 138 140  Potassium 3.5 - 5.1 mmol/L 3.8 3.5 4.2  Chloride 98 - 111 mmol/L 105 102 105  CO2 22 - 32 mmol/L 27 27 26   Calcium 8.9 - 10.3 mg/dL 9.5 9.7 8.9  Total Protein 6.5 - 8.1 g/dL 8.0 8.0 7.7  Total Bilirubin 0.3 - 1.2 mg/dL 0.7 0.5 0.5  Alkaline Phos 38 - 126 U/L 88 73 90  AST 15 - 41 U/L 11(L) 14(L) 21  ALT 0 - 44 U/L 14 14 23     RADIOGRAPHIC STUDIES: No results found.  ASSESSMENT & PLAN Noah Moore 30 y.o. male  with medical history significant for extensive provoked LLE DVT who presents for a follow up visit.  After review the labs, discussion with the patient, and review of the prior records the patient's findings are most consistent with recurrent VTEs of unclear etiology.  On exam today Noah Moore is doing well overall from a thrombosis standpoint.  He is  not having any further swelling or discomfort in his lower extremities.  He does however have issues with taking the Lovenox including bruising, nodules, and the inability to administer the shots himself.  He is eager for an alternative form of anticoagulation.  We discussed in detail previously that he has failed therapy on Eliquis, Xarelto, and Coumadin.  It would be most appropriate for him to continue with the Lovenox, though I did note that with his referral to Dr. Dionne Milo he may be offered other suggestions for how to treat this thrombosis.  During the patient's prior hospitalization he was seen by Dr. Monica Martinez with vascular surgery who performed thrombolysis and percutaneous mechanical thrombectomy of the left external iliac, common iliac, distal IVC on 06/24/2020.  Due to concern the fact that the occurred while on anticoagulation therapy with Eliquis he was transitioned to Coumadin therapy at time of discharge and is subsequently established with Coumadin clinic in the outpatient setting.  After d/c of Lovenox on Saturday the patient began to have worsening leg swelling and developed tender erythematous raised lesions. These have subsequently resolved. 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 Patient appears stable on lovenox at this time.  --prior hypercoagulation/inflammatory workups were unrevealing. --will also attempt to r/o malignancy with a testicular US (prior imaging of Chest (02/2020) and abdomen/pelvis (05/2020) showed no evidence of disease)  --still in process of trying to connect the patient with a hematologist at [facility]. He is a visit scheduled for 03/2022. --vascular surgery evaluated for a compressive syndrome (such as May Thurner). No evidence of this was found. Appreciate their assistance in this case.  --RTC in 6 months time  No orders of the defined types were placed in this encounter.   All questions were answered. The patient knows to call the clinic with any problems, questions or concerns.  A total of more than 30 minutes were spent on this encounter and over half of that time was spent on counseling and coordination of care as outlined above.   Ledell Peoples, MD Department of Hematology/Oncology [place] at [facility] Phone: [phone] Pager: [phone] Email: [email or web address]  11/15/2021 4:00 PM

## 2021-12-26 ENCOUNTER — Ambulatory Visit: Payer: BC Managed Care – PPO | Admitting: Vascular Surgery

## 2022-01-29 ENCOUNTER — Telehealth: Payer: Self-pay | Admitting: *Deleted

## 2022-01-29 NOTE — Telephone Encounter (Signed)
Received call from patient. He states he has new insurance and the new insurance will not cover his Lovenox injections, His payment for this would be $2000/month.  Advised pt to contact his insurance company and find out who Dr. Lorenso Courier would need to talk to for a Peer to Peer regarding his lovenox. Pt understands that this is the only medication that is preventing his blood clots. He has failed Warfarin, Eliquis and Xarelto ? ?Pt said he would call his insurance and find out who Dr. Lorenso Courier can talk to about this. ?

## 2022-02-01 IMAGING — DX DG CHEST 2V
2 series · 2 of 2 positions shown · non-contrast
Comparison: 06/24/2021

CLINICAL DATA: Persistent cough

EXAM:
CHEST - 2 VIEW

[chest pa]
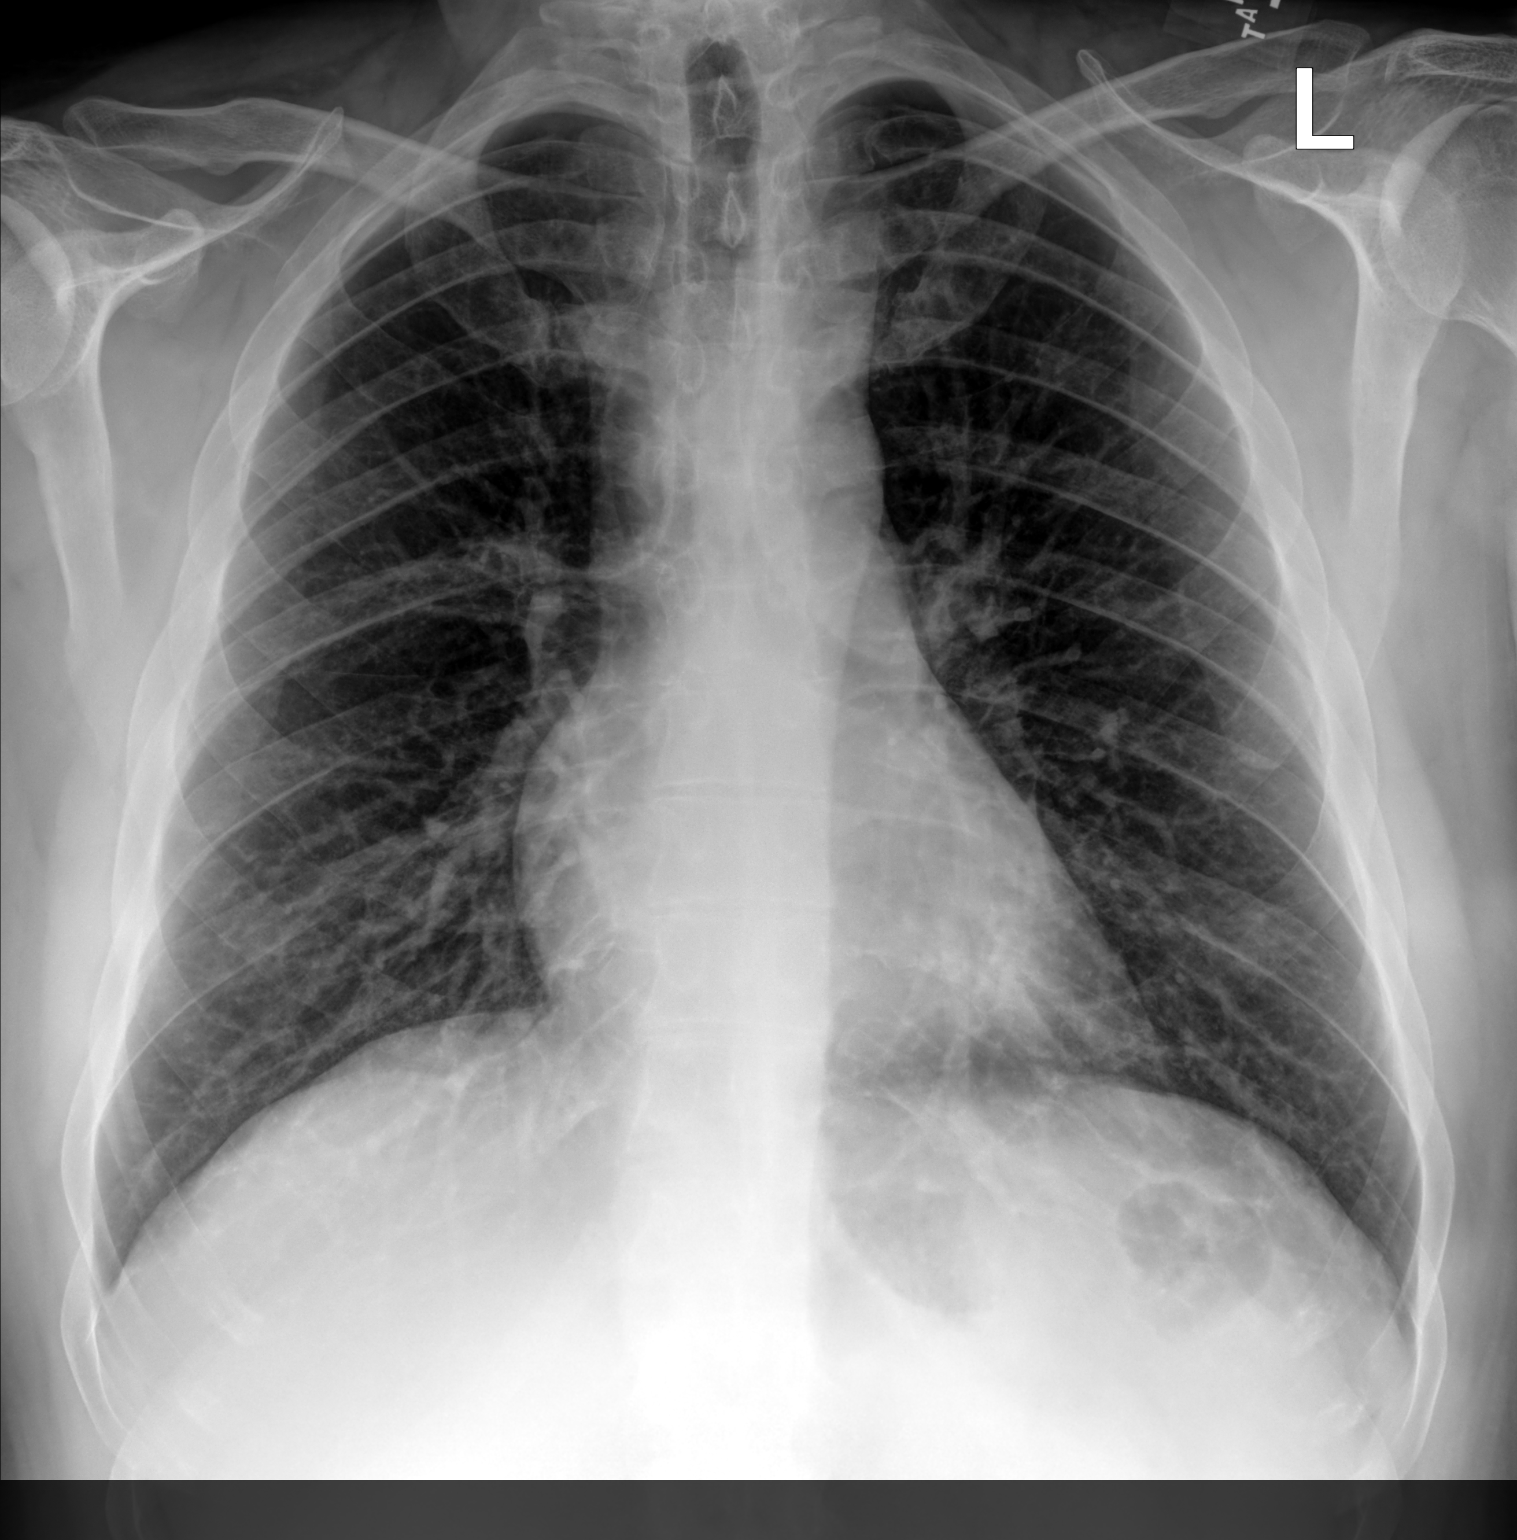

[chest lat]
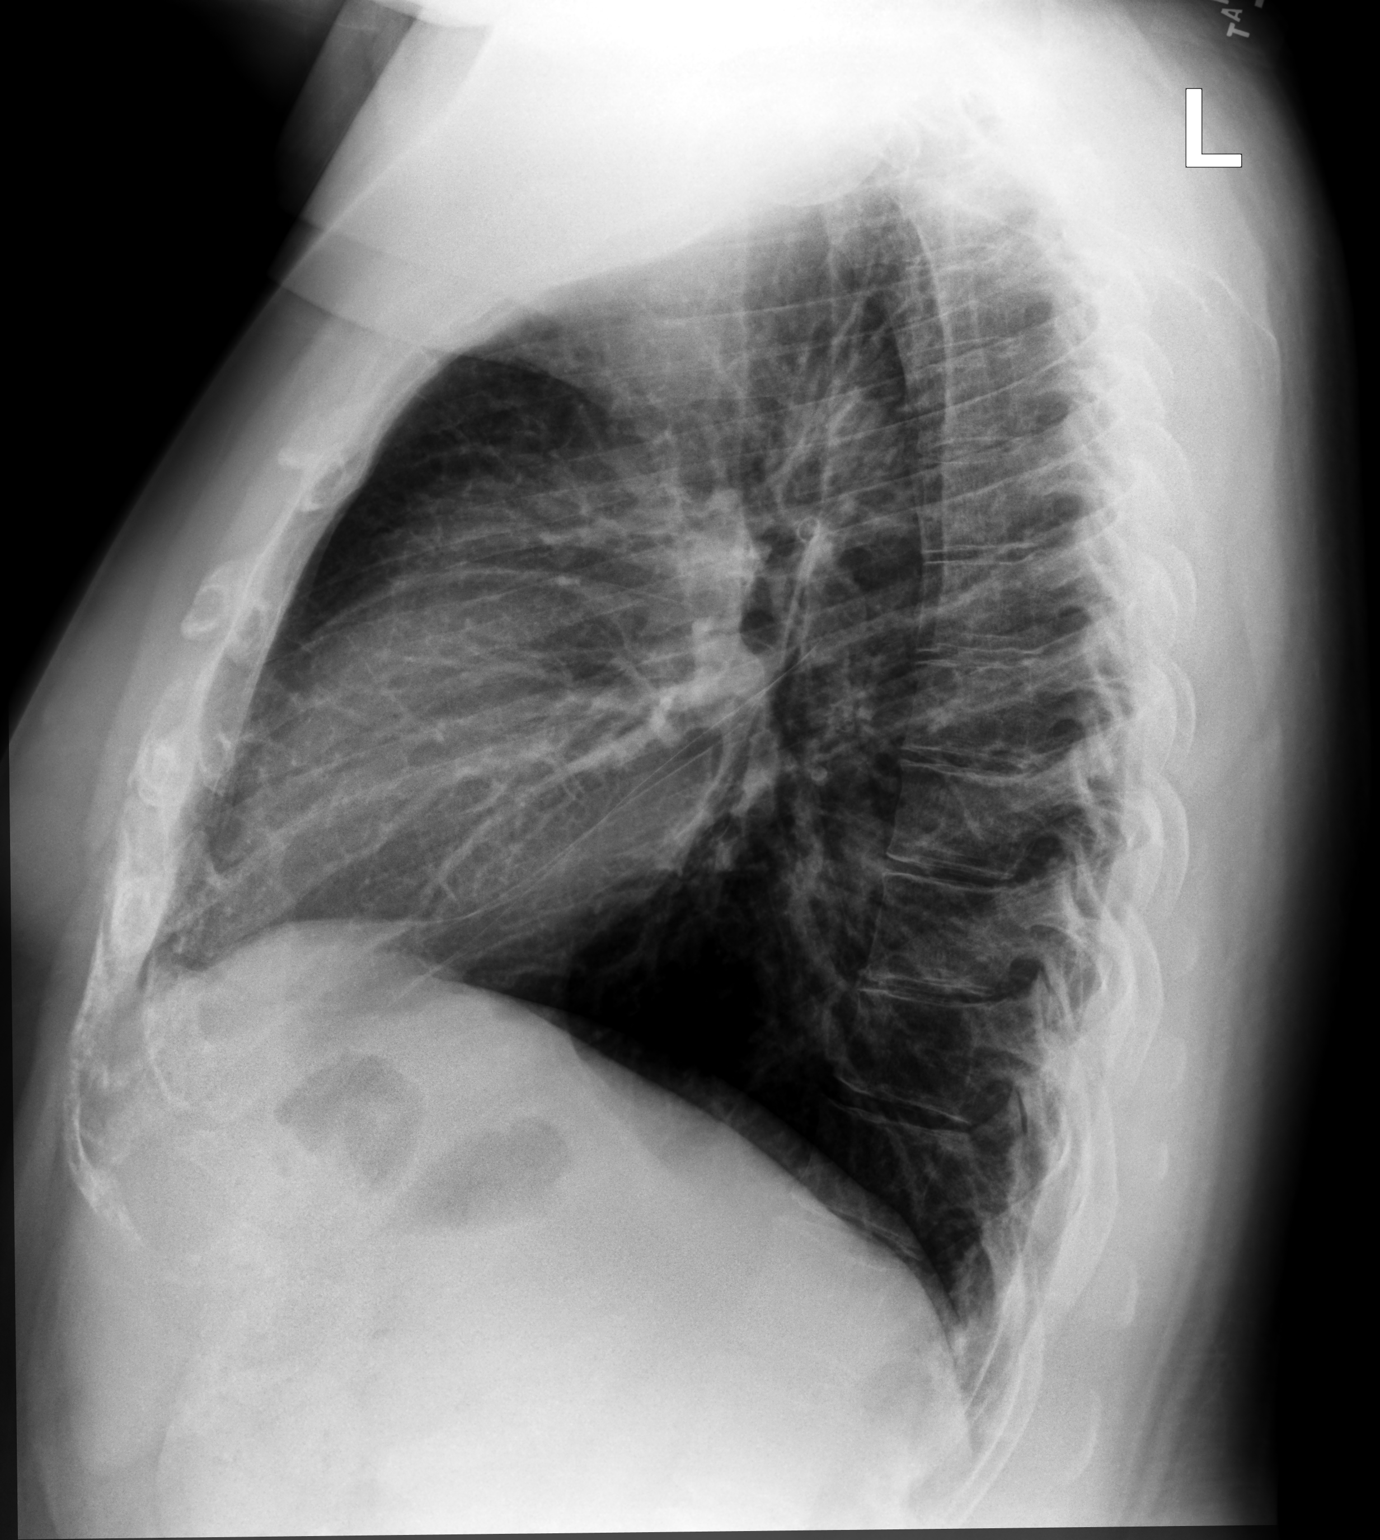

[2 of 2 positions shown; findings below may reference images not displayed]

FINDINGS: The heart size and mediastinal contours are within normal limits.
Both lungs are clear. The visualized skeletal structures are
unremarkable.
IMPRESSION: Normal study.

## 2022-02-12 ENCOUNTER — Telehealth: Payer: Self-pay | Admitting: *Deleted

## 2022-02-12 NOTE — Telephone Encounter (Signed)
Patient assistance form from [employer] (for lovenox) left at front desk for pt or his significant other to pick up and complete. They are to return this for Dr. Lorenso Courier to sign. We will then fax this to [place]. ?

## 2022-02-12 NOTE — Telephone Encounter (Signed)
Received call from pt's significant other, Katie Clowers. She states that Noah Moore has new insurance and this new insurance refuses to pay for his Lovenox. This patient cannot use any other type of anticoagulant-he has failed trials of Coumadin, Xarelto and Eliquis. He developed clots on all of these, but has has remained without clots on the Lovenox. ?Advised Katie that I can send his medical records outlining the above to his insurance co or a peer to peer can done with Dr. Lorenso Courier and the Psychologist, occupational next week. Dr. Lorenso Courier is out of the office the rest of this week. ?Joellen Jersey will reach out to [employer] again and call back. ? ?Message sent to Stefanie Libel, financial advocate regarding this serious concern. ?

## 2022-03-06 ENCOUNTER — Ambulatory Visit
Admission: EM | Admit: 2022-03-06 | Discharge: 2022-03-06 | Disposition: A | Discharge status: Home / Self Care | Payer: Medicaid Other | Attending: Physician Assistant | Admitting: Physician Assistant

## 2022-03-06 DIAGNOSIS — J029 Acute pharyngitis, unspecified: Secondary | ICD-10-CM | POA: Diagnosis present

## 2022-03-06 LAB — POCT RAPID STREP A (OFFICE): Rapid Strep A Screen: NEGATIVE

## 2022-03-06 NOTE — ED Triage Notes (Signed)
Pt presents with complaints of sore throat and congestion that started last week. Pts daughter has rsv. Denies fever. ?

## 2022-03-06 NOTE — ED Provider Notes (Signed)
?[facility] ? ? ? ?CSN: [id number] ?Arrival date & time: 03/06/22  1231 ? ? ?  ? ?History   ?Chief Complaint ?Chief Complaint  ?Patient presents with  ? Sore Throat  ? ? ?HPI ?Saeed Toren Sassi is a 31 y.o. male.  ? ?Patient here today for evaluation of sore throat and congestion that started last week. Daughter has been diagnosed with RSV. He has not had fever. He denies any nausea, vomiting or diarrhea. He has been taking mucinex with mild relief.  ? ?The history is provided by the patient.  ? ?Past Medical History:  ?Diagnosis Date  ? Allergy   ? DVT (deep venous thrombosis) ([place])   ? DVT (deep venous thrombosis) ([place]) 02/2020  ? GERD (gastroesophageal reflux disease)   ? ? ?Patient Active Problem List  ? Diagnosis Date Noted  ? Chronic allergic rhinitis 10/01/2021  ? Body mass index (BMI) of 36.0-36.9 in adult 10/01/2021  ? Blood clotting disorder ([place]) 10/01/2021  ? Thrombophlebitis 06/22/2020  ? DVT (deep venous thrombosis) ([place]) 06/21/2020  ? DVT (deep venous thrombosis) ([place]) 03/17/2020  ? 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 SIRS (systemic inflammatory response syndrome) ([place]) 03/17/2020  ? Paronychia of finger 03/17/2020  ? Thrombocytosis 03/17/2020  ? Sepsis ([place]) 03/17/2020  ? ? ?Past Surgical History:  ?Procedure Laterality Date  ? INTRAVASCULAR ULTRASOUND/IVUS N/A 03/30/2020  ? Procedure: INTRAVASCULAR ULTRASOUND/IVUS;  Surgeon: Marty Heck, MD;  Location: [facility];  Service: Cardiovascular;  Laterality: N/A;  ? INTRAVASCULAR ULTRASOUND/IVUS N/A 06/24/2020  ? Procedure: Intravascular Ultrasound/IVUS;  Surgeon: Marty Heck, MD;  Location: [facility];  Service: Cardiovascular;  Laterality: N/A;  ? IVC VENOGRAPHY N/A 03/30/2020  ? Procedure: IVC Venography;  Surgeon: Marty Heck, MD;  Location: [facility];  Service: Cardiovascular;  Laterality: N/A;  ? KNEE SURGERY Right   ? LEG SURGERY    ? Reports history  of right lower extremity tibia fracture in 2010 which was repaired surgically.  ? LOWER EXTREMITY VENOGRAPHY Left 03/30/2020  ? Procedure: LOWER EXTREMITY VENOGRAPHY;  Surgeon: Marty Heck, MD;  Location: [facility];  Service: Cardiovascular;  Laterality: Left;  ? LOWER EXTREMITY VENOGRAPHY  06/23/2020  ? Procedure: LOWER EXTREMITY VENOGRAPHY;  Surgeon: Marty Heck, MD;  Location: [facility];  Service: Cardiovascular;;  ? PERIPHERAL VASCULAR THROMBECTOMY Left 03/30/2020  ? Procedure: PERIPHERAL VASCULAR THROMBECTOMY;  Surgeon: Marty Heck, MD;  Location: [facility];  Service: Cardiovascular;  Laterality: Left;  ? PERIPHERAL VASCULAR THROMBECTOMY N/A 06/24/2020  ? Procedure: PERIPHERAL VASCULAR THROMBECTOMY;  Surgeon: Marty Heck, MD;  Location: [facility];  Service: Cardiovascular;  Laterality: N/A;  ? THROMBECTOMY  03/2020  ? ULTRASOUND GUIDANCE FOR VASCULAR ACCESS N/A 07/28/2020  ? Procedure: ULTRASOUND GUIDANCE ACCESS OF RIGHT INTERNAL JUGULAR, ULTRASOUND GUIDANCE ACCESS OF POSTERIOR TIBIAL VEIN;  Surgeon: Waynetta Sandy, MD;  Location: [facility];  Service: Vascular;  Laterality: N/A;  ? VENOGRAM Left 07/28/2020  ? Procedure: CENTRAL VENOGRAM AND LEFT LOWER EXTREMITY VENOGRAM;  Surgeon: Waynetta Sandy, MD;  Location: [facility];  Service: Vascular;  Laterality: Left;  ? WISDOM TOOTH EXTRACTION    ? ? ? ? ? ?Home Medications   ? ?Prior to Admission medications   ?Medication Sig Start Date End Date Taking? Authorizing Provider  ?Amphetamine-Dextroamphetamine (ADDERALL XR PO) Take 25 mg by mouth.    [provider]  ?benzonatate (  TESSALON) 100 MG capsule Take 1 capsule (100 mg total) by mouth every 8 (eight) hours as needed for cough. ?Patient not taking: Reported on 09/21/2021 07/04/21   Teodora Medici, FNP  ?buPROPion (WELLBUTRIN XL) 150 MG 24 hr tablet Take 150 mg by mouth daily. ?Patient not taking: Reported on 09/21/2021    [provider]  ?cetirizine (ZYRTEC) 10 MG tablet     [provider]  ?enoxaparin (LOVENOX) 150 MG/ML injection Inject 0.86 mLs (130 mg total) into the skin every 12 (twelve) hours. Please refill with 90 day supply 11/15/21   Orson Slick, MD  ?magic mouthwash (lidocaine, diphenhydrAMINE, alum & mag hydroxide) suspension Swish and spit 5 mLs 3 (three) times daily as needed for mouth pain. 06/24/21   Ripley Fraise, MD  ?methylphenidate 27 MG PO CR tablet Take 27 mg by mouth every morning. ?Patient not taking: Reported on 09/21/2021    [provider]  ? ? ?Family History ?Family History  ?Adopted: Yes  ?Problem Relation Age of Onset  ? Alcoholism Mother   ? Diabetes Mother   ? High blood pressure Mother   ? Alcoholism Father   ? Cancer Maternal Grandmother   ? Venous thrombosis Neg Hx   ? ? ?Social History ?Social History  ? ?Tobacco Use  ? Smoking status: Former  ?  Types: E-cigarettes, Cigarettes  ? Smokeless tobacco: Never  ?Vaping Use  ? Vaping Use: Former  ?Substance Use Topics  ? Alcohol use: Not Currently  ? Drug use: Not Currently  ? ? ? ?Allergies   ?Patient has no known allergies. ? ? ?Review of Systems ?Review of Systems  ?Constitutional:  Negative for chills and fever.  ?HENT:  Positive for congestion and sore throat. Negative for ear pain.   ?Eyes:  Negative for discharge and redness.  ?Respiratory:  Positive for cough. Negative for shortness of breath.   ?Gastrointestinal:  Negative for abdominal pain, nausea and vomiting.  ? ? ?Physical Exam ?Triage Vital Signs ?ED Triage Vitals  ?Enc Vitals Group  ?   BP   ?   Pulse   ?   Resp   ?   Temp   ?   Temp src   ?   SpO2   ?   Weight   ?   Height   ?   Head Circumference   ?   Peak Flow   ?   Pain Score   ?   Pain Loc   ?   Pain Edu?   ?   Excl. in [place]?   ? ?No data found. ? ?Updated Vital Signs ?BP 123/87   Pulse 88   Temp 98 ?F (36.7 ?C)   Resp 19   SpO2 96%  ?   ? ?Physical Exam ?Vitals and nursing note reviewed.   ?Constitutional:   ?   General: He is not in acute distress. ?   Appearance: Normal appearance. He is not ill-appearing.  ?HENT:  ?   Head: Normocephalic and atraumatic.  ?   Nose: Congestion present.  ?   Mouth/Throat:  ?   Mouth: Mucous membranes are moist.  ?   Pharynx: Oropharynx is clear. Posterior oropharyngeal erythema present. No oropharyngeal exudate.  ?Eyes:  ?   Conjunctiva/sclera: Conjunctivae normal.  ?Cardiovascular:  ?   Rate and Rhythm: Normal rate and regular rhythm.  ?   Heart sounds: Normal heart sounds. No murmur heard. ?Pulmonary:  ?   Effort: Pulmonary effort  is normal. No respiratory distress.  ?   Breath sounds: Normal breath sounds. No wheezing, rhonchi or rales.  ?Skin: ?   General: Skin is warm and dry.  ?Neurological:  ?   Mental Status: He is alert.  ?Psychiatric:     ?   Mood and Affect: Mood normal.     ?   Thought Content: Thought content normal.  ? ? ? ?UC Treatments / Results  ?Labs ?(all labs ordered are listed, but only abnormal results are displayed) ?Labs Reviewed  ?CULTURE, GROUP A STREP [facility])  ?COVID-19, FLU A+B AND RSV  ?POCT RAPID STREP A (OFFICE)  ? ? ?EKG ? ? ?Radiology ?No results found. ? ?Procedures ?Procedures (including critical care time) ? ?Medications Ordered in UC ?Medications - No data to display ? ?Initial Impression / Assessment and Plan / UC Course  ?I have reviewed the triage vital signs and the nursing notes. ? ?Pertinent labs & imaging results that were available during my care of the patient were reviewed by me and considered in my medical decision making (see chart for details). ? ?  ?Strep test negative in office. Suspect viral etiology of symptoms- will screen for covid, flu and strep. Encouraged follow up with any further concerns. Advised increased fluids and symptomatic treatment while awaiting results.  ? ?Final Clinical Impressions(s) / UC Diagnoses  ? ?Final diagnoses:  ?Acute pharyngitis, unspecified etiology  ? ?Discharge Instructions    ?None ?  ? ?ED Prescriptions   ?None ?  ? ?PDMP not reviewed this encounter. ?  ?Francene Finders, PA-C ?03/06/22 1356 ? ?

## 2022-03-07 LAB — COVID-19, FLU A+B AND RSV
Influenza A, NAA: NOT DETECTED
Influenza B, NAA: NOT DETECTED
RSV, NAA: NOT DETECTED
SARS-CoV-2, NAA: NOT DETECTED

## 2022-03-08 LAB — CULTURE, GROUP A STREP (THRC)

## 2022-03-31 DIAGNOSIS — Z7901 Long term (current) use of anticoagulants: Secondary | ICD-10-CM | POA: Insufficient documentation

## 2022-04-25 ENCOUNTER — Telehealth: Payer: Self-pay | Admitting: Hematology and Oncology

## 2022-04-25 NOTE — Telephone Encounter (Signed)
Called patient regarding upcoming June appointments, left a voicemail. 

## 2022-05-16 ENCOUNTER — Other Ambulatory Visit: Payer: Self-pay | Admitting: Hematology and Oncology

## 2022-05-16 ENCOUNTER — Inpatient Hospital Stay: Payer: Medicaid Other | Attending: Hematology and Oncology

## 2022-05-16 ENCOUNTER — Inpatient Hospital Stay (HOSPITAL_BASED_OUTPATIENT_CLINIC_OR_DEPARTMENT_OTHER): Payer: Medicaid Other | Admitting: Hematology and Oncology

## 2022-05-16 ENCOUNTER — Telehealth: Payer: Self-pay | Admitting: Hematology and Oncology

## 2022-05-16 VITALS — BP 130/78 | HR 90 | Temp 98.0°F | Resp 17 | Wt 292.9 lb

## 2022-05-16 DIAGNOSIS — Z7901 Long term (current) use of anticoagulants: Secondary | ICD-10-CM | POA: Insufficient documentation

## 2022-05-16 DIAGNOSIS — Z809 Family history of malignant neoplasm, unspecified: Secondary | ICD-10-CM | POA: Insufficient documentation

## 2022-05-16 DIAGNOSIS — I8290 Acute embolism and thrombosis of unspecified vein: Secondary | ICD-10-CM | POA: Diagnosis not present

## 2022-05-16 DIAGNOSIS — Z87891 Personal history of nicotine dependence: Secondary | ICD-10-CM | POA: Diagnosis not present

## 2022-05-16 DIAGNOSIS — Z86718 Personal history of other venous thrombosis and embolism: Secondary | ICD-10-CM | POA: Diagnosis present

## 2022-05-16 LAB — CBC WITH DIFFERENTIAL (CANCER CENTER ONLY)
Abs Immature Granulocytes: 0.03 10*3/uL (ref 0.00–0.07)
Basophils Absolute: 0.1 10*3/uL (ref 0.0–0.1)
Basophils Relative: 2 %
Eosinophils Absolute: 0.1 10*3/uL (ref 0.0–0.5)
Eosinophils Relative: 1 %
HCT: 50 % (ref 39.0–52.0)
Hemoglobin: 16.3 g/dL (ref 13.0–17.0)
Immature Granulocytes: 1 %
Lymphocytes Relative: 38 %
Lymphs Abs: 1.9 10*3/uL (ref 0.7–4.0)
MCH: 28.6 pg (ref 26.0–34.0)
MCHC: 32.6 g/dL (ref 30.0–36.0)
MCV: 87.9 fL (ref 80.0–100.0)
Monocytes Absolute: 0.5 10*3/uL (ref 0.1–1.0)
Monocytes Relative: 9 %
Neutro Abs: 2.5 10*3/uL (ref 1.7–7.7)
Neutrophils Relative %: 49 %
Platelet Count: 299 10*3/uL (ref 150–400)
RBC: 5.69 MIL/uL (ref 4.22–5.81)
RDW: 14.3 % (ref 11.5–15.5)
WBC Count: 5 10*3/uL (ref 4.0–10.5)
nRBC: 0 % (ref 0.0–0.2)

## 2022-05-16 LAB — CMP (CANCER CENTER ONLY)
ALT: 22 U/L (ref 0–44)
AST: 19 U/L (ref 15–41)
Albumin: 4.6 g/dL (ref 3.5–5.0)
Alkaline Phosphatase: 81 U/L (ref 38–126)
Anion gap: 6 (ref 5–15)
BUN: 6 mg/dL (ref 6–20)
CO2: 29 mmol/L (ref 22–32)
Calcium: 9.3 mg/dL (ref 8.9–10.3)
Chloride: 105 mmol/L (ref 98–111)
Creatinine: 0.94 mg/dL (ref 0.61–1.24)
GFR, Estimated: >60 mL/min (ref >60)
Glucose, Bld: 79 mg/dL (ref 70–99)
Potassium: 4.1 mmol/L (ref 3.5–5.1)
Sodium: 140 mmol/L (ref 135–145)
Total Bilirubin: 0.4 mg/dL (ref 0.3–1.2)
Total Protein: 8.1 g/dL (ref 6.5–8.1)

## 2022-05-16 MED ORDER — ENOXAPARIN SODIUM 150 MG/ML IJ SOSY: 130.0000 mg | PREFILLED_SYRINGE | Freq: Two times a day (BID) | INTRAMUSCULAR | 4 refills | Status: DC

## 2022-05-16 NOTE — Telephone Encounter (Signed)
Per 6/21 los called and left message for pt about appointment. Details and call back number were left

## 2022-05-16 NOTE — Progress Notes (Signed)
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 US LE showed acute deep vein thrombosis involving the left common femoral vein, left femoral vein, and left popliteal vein. Started on anticoagulation therapy. Admitted 4/21-4/24/2021. D/c on Xarelto 2) 03/17/2020: CTA shows no evidence of PE 3) 03/30/2020: patient presented to ED with worsening pain. Admitted again and underwent peripheral vascular thrombectomy. Transitioned to Apixaban. 4) 04/11/2020: establish care with Dr. Dorsey  5) 06/21/2020-06/26/2020: developed new VTEs requiring hospitalization and thrombolysis/mechanical thrombectomy of the left external iliac vein, common iliac vein, and IVC with vascular surgery on 06/23/2020. He was transitioned to coumadin at that time. 6) 07/04/2020: patient therapeutic on coumadin at INR 2.3, underwent US lower extremity with showed new thrombus in the distal IVC, external iliac and internal iliac veins. 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 male with medical history significant for extensive provoked LLE DVT who presents for a follow up visit. The patient's last visit was on 11/15/2021. In the interim since the last visit Mr. Redler has continued lovenox therapy without difficulty.  He is also met with Dr. Ortel at [facility].  On exam today Mr. [place] reports he has  been well overall interim since her last visit.  He has continued his Lovenox shots without any issues of bleeding or bruising.  He notes that he does occasionally have flares with red spots on his legs and currently has some red spots on his left lower extremity.  He reports that he is running low on his Lovenox and would like a refill today.  He notes that overall he is not having any trouble with bruising at the injection site.  He notes that he is also not having any signs or symptoms concerning for recurrence of VTE including leg swelling, pain in the leg, shortness of breath, or chest pain.  He has lost about 10 pounds in weight which she reports has been unintentional.  He notes otherwise he has been well and has not been having any issues with fevers, chills, sweats, nausea, vomiting or diarrhea.  A full 10 point ROS is listed below.   MEDICAL HISTORY:  Past Medical History:  Diagnosis Date   Allergy    DVT (deep venous thrombosis) ([facility])    DVT (deep venous thrombosis) ([facility]) 02/2020   GERD (gastroesophageal reflux disease)     SURGICAL HISTORY: Past Surgical History:  Procedure Laterality Date   INTRAVASCULAR ULTRASOUND/IVUS N/A 03/30/2020   Procedure: INTRAVASCULAR ULTRASOUND/IVUS;  Surgeon: Clark, Christopher J, MD;  [facility];  Service: Cardiovascular;  Laterality: N/A;   INTRAVASCULAR ULTRASOUND/IVUS N/A 06/24/2020   Procedure: Intravascular Ultrasound/IVUS;  Surgeon: Clark, Christopher J, MD;  [facility]: [facility];  Service: Cardiovascular;  Laterality: N/A;   IVC VENOGRAPHY N/A 03/30/2020   Procedure: IVC Venography;  Surgeon: Clark, Christopher J, MD;  [facility];  Service: Cardiovascular;  Laterality: N/A;   KNEE SURGERY Right    LEG SURGERY       Reports history of right lower extremity tibia fracture in 2010 which was repaired surgically.   LOWER EXTREMITY VENOGRAPHY Left 03/30/2020   Procedure: LOWER EXTREMITY VENOGRAPHY;  Surgeon: Marty Heck, MD;  Location: [facility];  Service: Cardiovascular;  Laterality: Left;   LOWER EXTREMITY VENOGRAPHY  06/23/2020   Procedure: LOWER EXTREMITY VENOGRAPHY;  Surgeon: Marty Heck, MD;  Location: [facility];  Service: Cardiovascular;;   PERIPHERAL VASCULAR THROMBECTOMY Left 03/30/2020   Procedure: PERIPHERAL VASCULAR THROMBECTOMY;  Surgeon: Marty Heck, MD;  Location: [facility];  Service: Cardiovascular;  Laterality: Left;   PERIPHERAL VASCULAR THROMBECTOMY N/A 06/24/2020   Procedure: PERIPHERAL VASCULAR THROMBECTOMY;  Surgeon: Marty Heck, MD;  Location: [facility];  Service: Cardiovascular;  Laterality: N/A;   THROMBECTOMY  03/2020   ULTRASOUND GUIDANCE FOR VASCULAR ACCESS N/A 07/28/2020   Procedure: ULTRASOUND GUIDANCE ACCESS OF RIGHT INTERNAL JUGULAR, ULTRASOUND GUIDANCE ACCESS OF POSTERIOR TIBIAL VEIN;  Surgeon: Waynetta Sandy, MD;  Location: [facility];  Service: Vascular;  Laterality: N/A;   VENOGRAM Left 07/28/2020   Procedure: CENTRAL VENOGRAM AND LEFT LOWER EXTREMITY VENOGRAM;  Surgeon: Waynetta Sandy, MD;  Location: [facility];  Service: Vascular;  Laterality: Left;   WISDOM TOOTH EXTRACTION      SOCIAL HISTORY: Social History   Socioeconomic History   Marital status: Single    Spouse name: Not on file   Number of children: Not on file   Years of education: Not on file   Highest education level: Not on file  Occupational History   Not on file  Tobacco Use   Smoking status: Former    Types: E-cigarettes, Cigarettes   Smokeless tobacco: Never  Vaping Use   Vaping Use: Former  Substance and Sexual Activity   Alcohol use: Not Currently   Drug use: Not Currently   Sexual activity: Yes    Birth control/protection: None  Other Topics Concern   Not on file  Social History Narrative   ** Merged History Encounter **       Social Determinants of Health   Financial Resource Strain: Not on file   Food Insecurity: Not on file  Transportation Needs: Not on file  Physical Activity: Not on file  Stress: Not on file  Social Connections: Not on file  Intimate Partner Violence: Not on file    FAMILY HISTORY: Family History  Adopted: Yes  Problem Relation Age of Onset   Alcoholism Mother    Diabetes Mother    High blood pressure Mother    Alcoholism Father    Cancer Maternal Grandmother    Venous thrombosis Neg Hx     ALLERGIES:  has No Known Allergies.  MEDICATIONS:  Current Outpatient Medications  Medication Sig Dispense Refill   Amphetamine-Dextroamphetamine (ADDERALL XR PO) Take 25 mg by mouth.     benzonatate (TESSALON) 100 MG capsule Take 1 capsule (100 mg total) by mouth every 8 (eight) hours as needed for cough. (Patient not taking: Reported on 09/21/2021) 21 capsule 0   buPROPion (WELLBUTRIN XL) 150 MG 24 hr tablet Take 150 mg by mouth daily. (Patient not taking: Reported on 09/21/2021)     cetirizine (ZYRTEC) 10 MG tablet      enoxaparin (LOVENOX) 150 MG/ML injection Inject 0.86 mLs (130 mg total) into the skin every 12 (twelve) hours. Please refill with 90 day supply 60 mL 4   magic mouthwash (lidocaine, diphenhydrAMINE, alum & mag hydroxide) suspension Swish and spit  5 mLs 3 (three) times daily as needed for mouth pain. 360 mL 0   methylphenidate 27 MG PO CR tablet Take 27 mg by mouth every morning. (Patient not taking: Reported on 09/21/2021)     No current facility-administered medications for this visit.    REVIEW OF SYSTEMS:   Constitutional: ( - ) fevers, ( - )  chills , ( - ) night sweats Eyes: ( - ) blurriness of vision, ( - ) double vision, ( - ) watery eyes Ears, nose, mouth, throat, and face: ( - ) mucositis, ( - ) sore throat Respiratory: ( - ) cough, ( - ) dyspnea, ( - ) wheezes Cardiovascular: ( - ) palpitation, ( - ) chest discomfort, ( - ) lower extremity swelling Gastrointestinal:  ( - ) nausea, ( - ) heartburn, ( - ) change in bowel  habits Skin: ( - ) abnormal skin rashes Lymphatics: ( - ) new lymphadenopathy, ( - ) easy bruising Neurological: ( - ) numbness, ( - ) tingling, ( - ) new weaknesses Behavioral/Psych: ( - ) mood change, ( - ) new changes  All other systems were reviewed with the patient and are negative.  PHYSICAL EXAMINATION: ECOG PERFORMANCE STATUS: 0 - Asymptomatic  Vitals:   05/16/22 1005  BP: 130/78  Pulse: 90  Resp: 17  Temp: 98 F (36.7 C)  SpO2: 97%   Filed Weights   05/16/22 1005  Weight: 292 lb 14.4 oz (132.9 kg)    GENERAL: well appearing young male,  alert, no distress and comfortable SKIN: No skin lesions noted.  EYES: conjunctiva are pink and non-injected, sclera clear LUNGS: clear to auscultation and percussion with normal breathing effort HEART: regular rate & rhythm and no murmurs. Musculoskeletal: no cyanosis of digits and no clubbing. No LE edema. No erythema or tenderness of the LLE.  PSYCH: alert & oriented x 3, fluent speech NEURO: no focal motor/sensory deficits  LABORATORY DATA:  I have reviewed the data as listed    Latest Ref Rng & Units 05/16/2022    9:38 AM 11/15/2021   10:03 AM 06/24/2021   12:58 AM  CBC  WBC 4.0 - 10.5 K/uL 5.0  8.9  13.4   Hemoglobin 13.0 - 17.0 g/dL 16.3  15.9  14.5   Hematocrit 39.0 - 52.0 % 50.0  49.5  44.1   Platelets 150 - 400 K/uL 299  353  280        Latest Ref Rng & Units 05/16/2022    9:38 AM 11/15/2021   10:03 AM 06/24/2021   12:58 AM  CMP  Glucose 70 - 99 mg/dL 79  94  97   BUN 6 - 20 mg/dL _0 Creatinine 0.61 - 1.24 mg/dL 0.94  1.02  0.91   Sodium 135 - 145 mmol/L 140  139  138   Potassium 3.5 - 5.1 mmol/L 4.1  3.8  3.5   Chloride 98 - 111 mmol/L 105  105  102   CO2 22 - 32 mmol/L _1 Calcium 8.9 - 10.3 mg/dL 9.3  9.5  9.7   Total Protein 6.5 - 8.1 g/dL 8.1  8.0  8.0   Total Bilirubin 0.3 - 1.2 mg/dL 0.4  0.7  0.5   Alkaline Phos 38 - 126 U/L 81  88  73   AST 15 - 41 U/L _2 ALT 0 - 44 U/L  22  14  14     RADIOGRAPHIC STUDIES: No results found.  ASSESSMENT & PLAN Noah Moore 31 y.o. male with medical history significant for extensive provoked LLE DVT who presents for a follow up visit.  After review the labs, discussion with the patient, and review of the prior records the patient's findings are most consistent with recurrent VTEs of unclear etiology.  On exam today [name] is doing well overall from a thrombosis standpoint.  He is not having any further swelling or discomfort in his lower extremities.  He does however have issues with taking the Lovenox including bruising, nodules, and the inability to administer the shots himself.  He is eager for an alternative form of anticoagulation.  We discussed in detail previously that he has failed therapy on Eliquis, Xarelto, and Coumadin.  It would be most appropriate for him to continue with the Lovenox, though I did note that with his referral to Dr. Dionne Milo he may be offered other suggestions for how to treat this thrombosis.  During the patient's prior hospitalization he was seen by Dr. Monica Martinez with vascular surgery who performed thrombolysis and percutaneous mechanical thrombectomy of the left external iliac, common iliac, distal IVC on 06/24/2020.  Due to concern the fact that the occurred while on anticoagulation therapy with Eliquis he was transitioned to Coumadin therapy at time of discharge and is subsequently established with Coumadin clinic in the outpatient setting.  After d/c of Lovenox on Saturday the patient began to have worsening leg swelling and developed tender erythematous raised lesions. These have subsequently resolved. 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 Patient appears stable on lovenox at this time.  --prior hypercoagulation/inflammatory workups were unrevealing. --will also attempt to r/o malignancy with a testicular US (prior imaging of Chest (02/2020) and abdomen/pelvis (05/2020) showed no evidence of disease)  --patient has connected with Dr. Dionne Milo at [facility]. His last visit was 03/30/2022.  --vascular surgery evaluated for a compressive syndrome (such as May Thurner). No evidence of this was found. --Labs today show creatinine 0.94, hemoglobin 16.3, MCV 87.9, and platelets of 299 --RTC in 6 months time  No orders of the defined types were placed in this encounter.   All questions were answered. The patient knows to call the clinic with any problems, questions or concerns.  A total of more than 30 minutes were spent on this encounter and over half of that time was spent on counseling and coordination of care as outlined above.   Ledell Peoples, MD Department of Hematology/Oncology [place] at [facility] Phone: [phone] Pager: [phone] Email: [email or web address]  05/16/2022 11:29 AM

## 2022-08-12 ENCOUNTER — Encounter (HOSPITAL_COMMUNITY): Payer: Self-pay | Admitting: Emergency Medicine

## 2022-08-12 ENCOUNTER — Other Ambulatory Visit: Payer: Self-pay

## 2022-08-12 ENCOUNTER — Emergency Department (HOSPITAL_COMMUNITY): Payer: Medicaid Other

## 2022-08-12 ENCOUNTER — Emergency Department (HOSPITAL_COMMUNITY)
Admission: EM | Admit: 2022-08-12 | Discharge: 2022-08-12 | Disposition: A | Discharge status: Home / Self Care | Payer: Medicaid Other | Attending: Emergency Medicine | Admitting: Emergency Medicine

## 2022-08-12 DIAGNOSIS — R030 Elevated blood-pressure reading, without diagnosis of hypertension: Secondary | ICD-10-CM | POA: Diagnosis not present

## 2022-08-12 DIAGNOSIS — S0990XA Unspecified injury of head, initial encounter: Secondary | ICD-10-CM | POA: Diagnosis present

## 2022-08-12 DIAGNOSIS — Z7901 Long term (current) use of anticoagulants: Secondary | ICD-10-CM | POA: Insufficient documentation

## 2022-08-12 DIAGNOSIS — S0093XA Contusion of unspecified part of head, initial encounter: Secondary | ICD-10-CM | POA: Diagnosis not present

## 2022-08-12 DIAGNOSIS — W010XXA Fall on same level from slipping, tripping and stumbling without subsequent striking against object, initial encounter: Secondary | ICD-10-CM | POA: Diagnosis not present

## 2022-08-12 MED ORDER — ACETAMINOPHEN 500 MG PO TABS
1000.0000 mg | ORAL_TABLET | Freq: Once | ORAL | Status: AC
  Administered 2022-08-12: 1000 mg via ORAL
  Filled 2022-08-12: qty 2

## 2022-08-12 NOTE — ED Triage Notes (Signed)
Patient here after falling down stairs and hitting his head, on Lovenox injections. Patient states he feels similar to when he had a concussion when he played football in school. Unsure of LOC, patient is alert, oriented, ambulating independently with steady gait.

## 2022-08-12 NOTE — ED Provider Notes (Signed)
[facility] EMERGENCY DEPARTMENT Provider Note   CSN: [id number] Arrival date & time: 08/12/22  1107     History  Chief Complaint  Patient presents with   Noah Moore is a 31 y.o. male.  Pt s/p fall at home today. Was on steps, rushing, slipped, and hit head on door frame at bottom. Was dazed. Denies loc. Milld-mod headache post head contusion. Is on lovenox for lower extremity dvt - no abnormal bruising or bleeding. Denies neck or back pain. No radicular pain. No numbness or weakness. No nv. Denies other pain or injury. No chest pain or sob. No abd pain. No extremity pain or injury. Skin intact.   The history is provided by the patient and medical records.  Fall Associated symptoms include headaches. Pertinent negatives include no chest pain and no abdominal pain.       Home Medications Prior to Admission medications   Medication Sig Start Date End Date Taking? Authorizing Provider  Amphetamine-Dextroamphetamine (ADDERALL XR PO) Take 25 mg by mouth.    [provider]  benzonatate (TESSALON) 100 MG capsule Take 1 capsule (100 mg total) by mouth every 8 (eight) hours as needed for cough. Patient not taking: Reported on 09/21/2021 07/04/21   Teodora Medici, FNP  buPROPion (WELLBUTRIN XL) 150 MG 24 hr tablet Take 150 mg by mouth daily. Patient not taking: Reported on 09/21/2021    [provider]  cetirizine (ZYRTEC) 10 MG tablet     [provider]  enoxaparin (LOVENOX) 150 MG/ML injection Inject 0.86 mLs (130 mg total) into the skin every 12 (twelve) hours. Please refill with 90 day supply 05/16/22   Orson Slick, MD  magic mouthwash (lidocaine, diphenhydrAMINE, alum & mag hydroxide) suspension Swish and spit 5 mLs 3 (three) times daily as needed for mouth pain. 06/24/21   Ripley Fraise, MD  methylphenidate 27 MG PO CR tablet Take 27 mg by mouth every morning. Patient not taking: Reported on 09/21/2021     [provider]      Allergies    Patient has no known allergies.    Review of Systems   Review of Systems  HENT:  Negative for nosebleeds.   Eyes:  Negative for pain and visual disturbance.  Cardiovascular:  Negative for chest pain.  Gastrointestinal:  Negative for abdominal pain, nausea and vomiting.  Musculoskeletal:  Negative for back pain and neck stiffness.  Skin:  Negative for wound.  Neurological:  Positive for headaches. Negative for weakness and numbness.    Physical Exam Updated Vital Signs BP (!) 141/85 (BP Location: Right Arm)   Pulse 78   Temp 97.6 F (36.4 C) (Oral)   Resp 16   SpO2 100%  Physical Exam Vitals and nursing note reviewed.  Constitutional:      Appearance: Normal appearance. He is well-developed.  HENT:     Head:     Comments: Tenderness scalp.     Nose: Nose normal.     Mouth/Throat:     Mouth: Mucous membranes are moist.     Pharynx: Oropharynx is clear.  Eyes:     General: No scleral icterus.    Extraocular Movements: Extraocular movements intact.     Conjunctiva/sclera: Conjunctivae normal.     Pupils: Pupils are equal, round, and reactive to light.  Neck:     Trachea: No tracheal deviation.  Cardiovascular:     Rate and Rhythm: Normal rate and regular rhythm.  Pulses: Normal pulses.     Heart sounds: Normal heart sounds. No murmur heard.    No friction rub. No gallop.  Pulmonary:     Effort: Pulmonary effort is normal. No accessory muscle usage or respiratory distress.     Breath sounds: Normal breath sounds.  Chest:     Chest wall: No tenderness.  Abdominal:     General: There is no distension.     Tenderness: There is no abdominal tenderness.  Genitourinary:    Comments: No cva tenderness. Musculoskeletal:        General: No swelling.     Cervical back: Normal range of motion and neck supple. No rigidity or tenderness.     Comments: CTLS spine, non tender, aligned, no step off. No focal extremity pain or  tenderness.   Skin:    General: Skin is warm and dry.     Findings: No rash.  Neurological:     Mental Status: He is alert.     Comments: Alert, speech clear. GCS 15. Motor/sens grossly intact bil. Steady gait.   Psychiatric:        Mood and Affect: Mood normal.     ED Results / Procedures / Treatments   Labs (all labs ordered are listed, but only abnormal results are displayed) Labs Reviewed - No data to display  EKG None  Radiology CT Head Wo Contrast  Result Date: 08/12/2022 CLINICAL DATA:  Fall, head trauma, coagulopathy EXAM: CT HEAD WITHOUT CONTRAST TECHNIQUE: Contiguous axial images were obtained from the base of the skull through the vertex without intravenous contrast. RADIATION DOSE REDUCTION: This exam was performed according to the departmental dose-optimization program which includes automated exposure control, adjustment of the mA and/or kV according to patient size and/or use of iterative reconstruction technique. COMPARISON:  None Available. FINDINGS: Brain: No evidence of acute infarction, hemorrhage, hydrocephalus, extra-axial collection or mass lesion/mass effect. Vascular: No hyperdense vessel or unexpected calcification. Skull: Normal. Negative for fracture or focal lesion. Sinuses/Orbits: No acute finding. Other: None. IMPRESSION: Normal head CT without contrast for age Electronically Signed   By: Jerilynn Mages.  Shick M.D.   On: 08/12/2022 11:37    Procedures Procedures    Medications Ordered in ED Medications  acetaminophen (TYLENOL) tablet 1,000 mg (has no administration in time range)    ED Course/ Medical Decision Making/ A&P                           Medical Decision Making Problems Addressed: Contusion of head, initial encounter: acute illness or injury with systemic symptoms that poses a threat to life or bodily functions Elevated blood pressure reading: acute illness or injury Fall from slip, trip, or stumble, initial encounter: acute illness or injury with  systemic symptoms that poses a threat to life or bodily functions Long term current use of anticoagulant therapy: chronic illness or injury that poses a threat to life or bodily functions  Amount and/or Complexity of Data Reviewed External Data Reviewed: notes. Radiology: ordered and independent interpretation performed. Decision-making details documented in ED Course.  Risk OTC drugs. Decision regarding hospitalization.   Imaging ordered.   Diff dx includes sdh, head contusion, etc - dispo decision including potential need for admission if ct abn, given anticoagulant therapy/head contusion/headache - will get imaging and revisit dispo decision.  Reviewed nursing notes and prior charts for additional history.   CT reviewed/interpreted by me - no hem.   Acetaminophen po.  Pt appears  stable for d/c.  Return precautions provided.         Final Clinical Impression(s) / ED Diagnoses Final diagnoses:  None    Rx / DC Orders ED Discharge Orders     None         Lajean Saver, MD 08/12/22 1326

## 2022-08-12 NOTE — Discharge Instructions (Addendum)
It was our pleasure to provide your ER care today - we hope that you feel better.t  Take acetaminophen as need.   Return to ER if worse, new symptoms, new/severe pain, severe headache, vomiting, or other concern.   Your blood pressure is mildly high today - follow up with primary care doctor in the next few weeks.

## 2022-08-27 ENCOUNTER — Encounter: Payer: Self-pay | Admitting: Emergency Medicine

## 2022-08-27 ENCOUNTER — Telehealth: Payer: Medicaid Other | Admitting: Physician Assistant

## 2022-08-27 ENCOUNTER — Encounter: Payer: Self-pay | Admitting: Physician Assistant

## 2022-08-27 ENCOUNTER — Ambulatory Visit
Admission: EM | Admit: 2022-08-27 | Discharge: 2022-08-27 | Disposition: A | Discharge status: Home / Self Care | Payer: Medicaid Other | Attending: Internal Medicine | Admitting: Internal Medicine

## 2022-08-27 DIAGNOSIS — K219 Gastro-esophageal reflux disease without esophagitis: Secondary | ICD-10-CM | POA: Diagnosis not present

## 2022-08-27 DIAGNOSIS — R1310 Dysphagia, unspecified: Secondary | ICD-10-CM | POA: Diagnosis not present

## 2022-08-27 MED ORDER — SUCRALFATE 1 G PO TABS: 1.0000 g | ORAL_TABLET | Freq: Three times a day (TID) | ORAL | 0 refills | Status: DC

## 2022-08-27 MED ORDER — OMEPRAZOLE 20 MG PO CPDR: 20.0000 mg | DELAYED_RELEASE_CAPSULE | Freq: Every day | ORAL | 0 refills | Status: DC

## 2022-08-27 NOTE — Patient Instructions (Signed)
  Legrand Como [employer], thank you for joining [facility], PA-C for today's virtual visit.  While this provider is not your primary care provider (PCP), if your PCP is located in our provider database this encounter information will be shared with them immediately following your visit.  Consent: (Patient) Noah Moore provided verbal consent for this virtual visit at the beginning of the encounter.  Current Medications:  Current Outpatient Medications:    Amphetamine-Dextroamphetamine (ADDERALL XR PO), Take 25 mg by mouth., Disp: , Rfl:    benzonatate (TESSALON) 100 MG capsule, Take 1 capsule (100 mg total) by mouth every 8 (eight) hours as needed for cough. (Patient not taking: Reported on 09/21/2021), Disp: 21 capsule, Rfl: 0   buPROPion (WELLBUTRIN XL) 150 MG 24 hr tablet, Take 150 mg by mouth daily. (Patient not taking: Reported on 09/21/2021), Disp: , Rfl:    cetirizine (ZYRTEC) 10 MG tablet, , Disp: , Rfl:    enoxaparin (LOVENOX) 150 MG/ML injection, Inject 0.86 mLs (130 mg total) into the skin every 12 (twelve) hours. Please refill with 90 day supply, Disp: 60 mL, Rfl: 4   magic mouthwash (lidocaine, diphenhydrAMINE, alum & mag hydroxide) suspension, Swish and spit 5 mLs 3 (three) times daily as needed for mouth pain., Disp: 360 mL, Rfl: 0   methylphenidate 27 MG PO CR tablet, Take 27 mg by mouth every morning. (Patient not taking: Reported on 09/21/2021), Disp: , Rfl:    omeprazole (PRILOSEC) 20 MG capsule, Take 1 capsule (20 mg total) by mouth daily., Disp: 30 capsule, Rfl: 0   sucralfate (CARAFATE) 1 g tablet, Take 1 tablet (1 g total) by mouth 4 (four) times daily -  with meals and at bedtime., Disp: 120 tablet, Rfl: 0   Medications ordered in this encounter:  No orders of the defined types were placed in this encounter.    *If you need refills on other medications prior to your next appointment, please contact your pharmacy*  Follow-Up: Call back or seek an  in-person evaluation if the symptoms worsen or if the condition fails to improve as anticipated.  [place] [phone]  Other Instructions Please take the prilosec and carafate that you were given by the prior provider at urgent care  Make sure you sit up for at least 1-2 hours after a meal before you lay down. Avoid acidic and high fat foods that exacerbate your symptoms   Follow up with your regular doctor in 1 week for reassessment and seek care sooner if your symptoms worsen or fail to improve.    If you have been instructed to have an in-person evaluation today at a local Urgent Care facility, please use the link below. It will take you to a list of all of our available [place] [facility], including address, phone number and hours of operation. Please do not delay care.  [place] [facility]  If you or a family member do not have a primary care provider, use the link below to schedule a visit and establish care. When you choose a [place] primary care physician or advanced practice provider, you gain a long-term partner in health. Find a Primary Care Provider  Learn more about [place]'s in-office and virtual care options: [facility]

## 2022-08-27 NOTE — ED Triage Notes (Addendum)
Pt is present today with c/o from recent medication. Pt states that he just started taking a blood thinner and now fills a burning sensation in his throat and that something is lodged in his throat. Pt states that he noticed the reaction Friday. Pt states that he was taking Paradox and spoke with his hematologist Friday regarding the reaction and they told him to d/c the medication and switch back to his Lovenox injections.

## 2022-08-27 NOTE — ED Provider Notes (Signed)
[facility]    CSN: [id number] Arrival date & time: 08/27/22  2542      History   Chief Complaint Chief Complaint  Patient presents with   Medication Reaction    HPI Noah Moore is a 31 y.o. male.   Patient presents with concerns for medication reaction.  Patient reports that he is being treated for DVT in his leg and was recently switched from Lovenox shots to dabigatran.  He has been taking this for about a week.  Patient reports that after starting to take this he developed a burning sensation in his throat that starts at the epigastric area and radiates up.  He reports it gets worse when he eats food.  He notified his provider who prescribed dabigatran and they advised to switch back to Lovenox.  He has since stopped taking dabigatran and is now taking Lovenox shots again but symptoms have continued.  Denies nausea, vomiting, diarrhea.  Denies any blood in stool recently.  Denies any associated fever.     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   SIRS (systemic inflammatory response syndrome) ([place]) 03/17/2020   Paronychia of finger 03/17/2020   Thrombocytosis 03/17/2020   Sepsis ([place]) 03/17/2020    Past Surgical History:  Procedure Laterality Date   INTRAVASCULAR ULTRASOUND/IVUS N/A 03/30/2020   Procedure: INTRAVASCULAR ULTRASOUND/IVUS;  Surgeon: Marty Heck, MD;  Location: [facility] [facility];  Service: Cardiovascular;   Laterality: N/A;   INTRAVASCULAR ULTRASOUND/IVUS N/A 06/24/2020   Procedure: Intravascular Ultrasound/IVUS;  Surgeon: Marty Heck, MD;  Location: [facility];  Service: Cardiovascular;  Laterality: N/A;   IVC VENOGRAPHY N/A 03/30/2020   Procedure: IVC Venography;  Surgeon: Marty Heck, MD;  Location: [facility];  Service: Cardiovascular;  Laterality: N/A;   KNEE SURGERY Right    LEG SURGERY     Reports history of right lower extremity tibia fracture in 2010 which was repaired surgically.   LOWER EXTREMITY VENOGRAPHY Left 03/30/2020   Procedure: LOWER EXTREMITY VENOGRAPHY;  Surgeon: Marty Heck, MD;  Location: [place] [facility];  Service: Cardiovascular;  Laterality: Left;   LOWER EXTREMITY VENOGRAPHY  06/23/2020   Procedure: LOWER EXTREMITY VENOGRAPHY;  Surgeon: Marty Heck, MD;  Location: [place] [facility];  Service: Cardiovascular;;   PERIPHERAL VASCULAR THROMBECTOMY Left 03/30/2020   Procedure: PERIPHERAL VASCULAR THROMBECTOMY;  Surgeon: Marty Heck, MD;  Location: [place] [facility];  Service: Cardiovascular;  Laterality: Left;   PERIPHERAL VASCULAR THROMBECTOMY N/A 06/24/2020   Procedure: PERIPHERAL VASCULAR THROMBECTOMY;  Surgeon: Marty Heck, MD;  Location: [place];  Service: Cardiovascular;  Laterality: N/A;   THROMBECTOMY  03/2020   ULTRASOUND GUIDANCE FOR VASCULAR ACCESS N/A 07/28/2020   Procedure: ULTRASOUND GUIDANCE ACCESS OF RIGHT INTERNAL JUGULAR, ULTRASOUND GUIDANCE ACCESS OF POSTERIOR TIBIAL VEIN;  Surgeon: Waynetta Sandy, MD;  Location: [place];  Service: Vascular;  Laterality: N/A;   VENOGRAM  Left 07/28/2020   Procedure: CENTRAL VENOGRAM AND LEFT LOWER EXTREMITY VENOGRAM;  Surgeon: Waynetta Sandy, MD;  Location: [place];  Service: Vascular;  Laterality: Left;   [place] Medications    Prior to Admission medications   Medication Sig Start Date End Date  Taking? Authorizing Provider  omeprazole (PRILOSEC) 20 MG capsule Take 1 capsule (20 mg total) by mouth daily. 08/27/22  Yes Irene Collings, Michele Rockers, FNP  sucralfate (CARAFATE) 1 g tablet Take 1 tablet (1 g total) by mouth 4 (four) times daily -  with meals and at bedtime. 08/27/22  Yes Shekina Cordell, Hildred Alamin E, FNP  Amphetamine-Dextroamphetamine (ADDERALL XR PO) Take 25 mg by mouth.    [provider]  benzonatate (TESSALON) 100 MG capsule Take 1 capsule (100 mg total) by mouth every 8 (eight) hours as needed for cough. Patient not taking: Reported on 09/21/2021 07/04/21   Teodora Medici, FNP  buPROPion (WELLBUTRIN XL) 150 MG 24 hr tablet Take 150 mg by mouth daily. Patient not taking: Reported on 09/21/2021    [provider]  cetirizine (ZYRTEC) 10 MG tablet     [provider]  enoxaparin (LOVENOX) 150 MG/ML injection Inject 0.86 mLs (130 mg total) into the skin every 12 (twelve) hours. Please refill with 90 day supply 05/16/22   Orson Slick, MD  magic mouthwash (lidocaine, diphenhydrAMINE, alum & mag hydroxide) suspension Swish and spit 5 mLs 3 (three) times daily as needed for mouth pain. 06/24/21   Ripley Fraise, MD  methylphenidate 27 MG PO CR tablet Take 27 mg by mouth every morning. Patient not taking: Reported on 09/21/2021    [provider]    Family History Family History  Adopted: Yes  Problem Relation Age of Onset   Alcoholism Mother    Diabetes Mother    High blood pressure Mother    Alcoholism Father    Cancer Maternal Grandmother    Venous thrombosis Neg Hx     Social History Social History   Tobacco Use   Smoking status: Former    Types: E-cigarettes, Cigarettes   Smokeless tobacco: Never  Vaping Use   Vaping Use: Former  Substance Use Topics   Alcohol use: Not Currently   Drug use: Not Currently     Allergies   Patient has no known allergies.   Review of Systems Review of Systems Per HPI  Physical Exam Triage Vital Signs ED  Triage Vitals  Enc Vitals Group     BP 08/27/22 0958 (!) 134/93     Pulse Rate 08/27/22 0958 83     Resp 08/27/22 0958 18     Temp 08/27/22 0958 98.4 F (36.9 C)     Temp src --      SpO2 08/27/22 0958 97 %     Weight --      Height --      Head Circumference --      Peak Flow --      Pain Score 08/27/22 0957 10     Pain Loc --      Pain Edu? --      Excl. in [place]? --    No data found.  Updated Vital Signs BP (!) 134/93   Pulse 83   Temp 98.4 F (36.9 C)   Resp 18   SpO2 97%   Visual Acuity Right Eye Distance:   Left Eye Distance:   Bilateral Distance:  Right Eye Near:   Left Eye Near:    Bilateral Near:     Physical Exam Constitutional:      General: He is not in acute distress.    Appearance: Normal appearance. He is not toxic-appearing or diaphoretic.  HENT:     Head: Normocephalic and atraumatic.     Mouth/Throat:     Pharynx: Oropharynx is clear. No pharyngeal swelling, oropharyngeal exudate, posterior oropharyngeal erythema or uvula swelling.     Tonsils: No tonsillar exudate or tonsillar abscesses.  Eyes:     Extraocular Movements: Extraocular movements intact.     Conjunctiva/sclera: Conjunctivae normal.  Cardiovascular:     Rate and Rhythm: Normal rate and regular rhythm.     Pulses: Normal pulses.     Heart sounds: Normal heart sounds.  Pulmonary:     Effort: Pulmonary effort is normal. No respiratory distress.     Breath sounds: Normal breath sounds.  Abdominal:     General: Bowel sounds are normal. There is no distension.     Palpations: Abdomen is soft.     Tenderness: There is no abdominal tenderness.  Neurological:     General: No focal deficit present.     Mental Status: He is alert and oriented to person, place, and time. Mental status is at baseline.  Psychiatric:        Mood and Affect: Mood normal.        Behavior: Behavior normal.        Thought Content: Thought content normal.        Judgment: Judgment normal.      UC  Treatments / Results  Labs (all labs ordered are listed, but only abnormal results are displayed) Labs Reviewed - No data to display  EKG   Radiology No results found.  Procedures Procedures (including critical care time)  Medications Ordered in UC Medications - No data to display  Initial Impression / Assessment and Plan / UC Course  I have reviewed the triage vital signs and the nursing notes.  Pertinent labs & imaging results that were available during my care of the patient were reviewed by me and considered in my medical decision making (see chart for details).     Patient's symptoms and physical exam are consistent with possible reflux versus ulcer versus esophagitis as cause of symptoms.  May be related to patient's medication change or may be coincidental.  Will prescribe Carafate and PPI.  Patient has already stopped medication and restarted Lovenox per his PCP.  Low suspicion for cardiac etiology or any associated bleeding given physical exam and vital signs.  Although, patient was given strict return and ER precautions if symptoms persist or worsen.  Patient verbalized understanding and was agreeable with plan. Final Clinical Impressions(s) / UC Diagnoses   Final diagnoses:  Gastroesophageal reflux disease, unspecified whether esophagitis present     Discharge Instructions      I am suspicious that you are having reflux related symptoms so I am treating you with 2 different medications to alleviate this discomfort.  Please follow-up if symptoms persist or worsen.    ED Prescriptions     Medication Sig Dispense Auth. Provider   sucralfate (CARAFATE) 1 g tablet Take 1 tablet (1 g total) by mouth 4 (four) times daily -  with meals and at bedtime. 120 tablet [place], J[place] E, [place]   omeprazole (PRILOSEC) 20 MG capsule Take 1 capsule (20 mg total) by mouth daily. 30 capsule [place], Michele Rockers, [place]  PDMP not reviewed this encounter.   Teodora Medici, [place] 08/27/22  1409

## 2022-08-27 NOTE — Progress Notes (Signed)
Mr. Noah Moore are scheduled for a virtual visit with your provider today.    Just as we do with appointments in the office, we must obtain your consent to participate.  Your consent will be active for this visit and any virtual visit you may have with one of our providers in the next 365 days.    If you have a MyChart account, I can also send a copy of this consent to you electronically.  All virtual visits are billed to your insurance company just like a traditional visit in the office.  As this is a virtual visit, video technology does not allow for your provider to perform a traditional examination.  This may limit your provider's ability to fully assess your condition.  If your provider identifies any concerns that need to be evaluated in person or the need to arrange testing such as labs, EKG, etc, we will make arrangements to do so.    Although advances in technology are sophisticated, we cannot ensure that it will always work on either your end or our end.  If the connection with a video visit is poor, we may have to switch to a telephone visit.  With either a video or telephone visit, we are not always able to ensure that we have a secure connection.   I need to obtain your verbal consent now.   Are you willing to proceed with your visit today?   Noah Moore has provided verbal consent on 08/27/2022 for a virtual visit (video or telephone).   Rodney Booze, [place] 08/27/2022  7:29 PM   Date:  08/27/2022   ID:  Noah Moore, DOB 06-11-1991, MRN [id number]  Patient Location: Home Provider Location: Home Office   Participants: Patient and Provider for Visit and Wrap up  Method of visit: Video  Location of Patient: Home Location of Provider: Home Office Consent was obtain for visit over the video. Services rendered by provider: Visit was performed via video  A video enabled telemedicine application was used and I verified that I am speaking with the correct  person using two identifiers.  PCP:  Dorothyann Peng, NP   Chief Complaint:  gerd  History of Present Illness:    Noah Moore is a 31 y.o. male with history as stated below. Presents video telehealth for an acute care visit  Pt was recently started on pradaxa. States when he takes it he feels like it is getting stuck in his throat. He has felt like he is having irritation to his throat and is having dysphasia. He was also initially having some increased gas. He states he was seen at urgent care and diagnosed with GERD. HE was started on carafate and prilosec. He wants to verify if his diagnosis was correct  Past Medical, Surgical, Social History, Allergies, and Medications have been Reviewed.  Past Medical History:  Diagnosis Date   Allergy    DVT (deep venous thrombosis) (HCC)    DVT (deep venous thrombosis) ([place]) 02/2020   GERD (gastroesophageal reflux disease)     No outpatient medications have been marked as taking for the 08/27/22 encounter (Video Visit) with [place].     Allergies:   Patient has no known allergies.   ROS See HPI for history of present illness.  Physical Exam Constitutional:      Appearance: He is not ill-appearing.  Neurological:     Mental Status: He is alert.        MDM: pt with  dysphasia and acid reflux sxs. Sxs sound consistent with esophagitis. He was appropriately started on prilosec and carafate earlier today. Advised him to continue these medications. Advised on plan for f/u        There are no diagnoses linked to this encounter.   Time:   Today, I have spent 12 minutes with the patient with telehealth technology discussing the above problems, reviewing the chart, previous notes, medications and orders.    Tests Ordered: No orders of the defined types were placed in this encounter.   Medication Changes: No orders of the defined types were placed in this encounter.    Disposition:  Follow up   Signed, Rodney Booze, PA-C  08/27/2022 7:29 PM

## 2022-08-27 NOTE — Discharge Instructions (Signed)
I am suspicious that you are having reflux related symptoms so I am treating you with 2 different medications to alleviate this discomfort.  Please follow-up if symptoms persist or worsen.

## 2022-11-07 DIAGNOSIS — Z7901 Long term (current) use of anticoagulants: Secondary | ICD-10-CM | POA: Diagnosis not present

## 2022-11-07 DIAGNOSIS — Z5181 Encounter for therapeutic drug level monitoring: Secondary | ICD-10-CM | POA: Diagnosis not present

## 2022-11-16 ENCOUNTER — Inpatient Hospital Stay: Payer: Self-pay | Attending: Hematology and Oncology

## 2022-11-16 ENCOUNTER — Inpatient Hospital Stay: Payer: Self-pay | Admitting: Hematology and Oncology

## 2022-11-16 ENCOUNTER — Other Ambulatory Visit: Payer: Self-pay | Admitting: Hematology and Oncology

## 2022-11-16 DIAGNOSIS — I8290 Acute embolism and thrombosis of unspecified vein: Secondary | ICD-10-CM

## 2022-12-06 ENCOUNTER — Ambulatory Visit
Admission: EM | Admit: 2022-12-06 | Discharge: 2022-12-06 | Disposition: A | Discharge status: Home / Self Care | Payer: BC Managed Care – PPO | Attending: Physician Assistant | Admitting: Physician Assistant

## 2022-12-06 DIAGNOSIS — J069 Acute upper respiratory infection, unspecified: Secondary | ICD-10-CM | POA: Diagnosis not present

## 2022-12-06 DIAGNOSIS — Z1152 Encounter for screening for COVID-19: Secondary | ICD-10-CM | POA: Insufficient documentation

## 2022-12-06 DIAGNOSIS — J029 Acute pharyngitis, unspecified: Secondary | ICD-10-CM | POA: Insufficient documentation

## 2022-12-06 LAB — POCT RAPID STREP A (OFFICE): Rapid Strep A Screen: NEGATIVE

## 2022-12-06 NOTE — ED Provider Notes (Signed)
[facility]    CSN: [id number] Arrival date & time: 12/06/22  0809      History   Chief Complaint Chief Complaint  Patient presents with   Fever   Sore Throat    HPI Noah Moore is a 32 y.o. male.   Patient here today for evaluation of flu like symptoms. He notes congestion, cough, sore throat, and body aches that started 3 days ago. He has had fever with Tmax of 102F . He denies any vomiting or diarrhea. He has tried tylenol with mild relief. He denies any known sick contacts.     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   SIRS (systemic inflammatory response syndrome) ([place]) 03/17/2020   Paronychia of finger 03/17/2020   Thrombocytosis 03/17/2020   Sepsis ([place]) 03/17/2020    Past Surgical History:  Procedure Laterality Date   INTRAVASCULAR ULTRASOUND/IVUS N/A 03/30/2020   Procedure: INTRAVASCULAR ULTRASOUND/IVUS;  Surgeon: Marty Heck, MD;  Location: [place] [facility];  Service: Cardiovascular;  Laterality: N/A;   INTRAVASCULAR ULTRASOUND/IVUS N/A 06/24/2020   Procedure: Intravascular Ultrasound/IVUS;  Surgeon: Marty Heck, MD;  Location: [place] [facility];  Service: Cardiovascular;  Laterality: N/A;   IVC VENOGRAPHY N/A 03/30/2020   Procedure: IVC Venography;  Surgeon: Marty Heck, MD;  Location: [place] [facility];  Service: Cardiovascular;  Laterality: N/A;   KNEE SURGERY Right    LEG SURGERY      Reports history of right lower extremity tibia fracture in 2010 which was repaired surgically.   LOWER EXTREMITY VENOGRAPHY Left 03/30/2020   Procedure: LOWER EXTREMITY VENOGRAPHY;  Surgeon: Marty Heck, MD;  Location: [place] [facility];  Service: Cardiovascular;  Laterality: Left;   LOWER EXTREMITY VENOGRAPHY  06/23/2020   Procedure: LOWER EXTREMITY VENOGRAPHY;  Surgeon: Marty Heck, MD;  Location: [place] [facility];  Service: Cardiovascular;;   PERIPHERAL VASCULAR THROMBECTOMY Left 03/30/2020   Procedure: PERIPHERAL VASCULAR THROMBECTOMY;  Surgeon: Marty Heck, MD;  Location: [place] [facility];  Service: Cardiovascular;  Laterality: Left;   PERIPHERAL VASCULAR THROMBECTOMY N/A 06/24/2020   Procedure: PERIPHERAL VASCULAR THROMBECTOMY;  Surgeon: Marty Heck, MD;  Location: [place] [facility];  Service: Cardiovascular;  Laterality: N/A;   THROMBECTOMY  03/2020   ULTRASOUND GUIDANCE FOR VASCULAR ACCESS N/A 07/28/2020   Procedure: ULTRASOUND GUIDANCE ACCESS OF RIGHT INTERNAL JUGULAR, ULTRASOUND GUIDANCE ACCESS OF POSTERIOR TIBIAL VEIN;  Surgeon: Waynetta Sandy, MD;  Location: [place];  Service: Vascular;  Laterality: N/A;   VENOGRAM Left 07/28/2020   Procedure: CENTRAL VENOGRAM AND LEFT LOWER EXTREMITY VENOGRAM;  Surgeon: Waynetta Sandy, MD;  Location: [place];  Service: Vascular;  Laterality: Left;   [place] Medications    Prior to Admission medications   Medication Sig Start Date End Date Taking? Authorizing Provider  enoxaparin (LOVENOX) 150 MG/ML injection Inject 0.86 mLs (130 mg  total) into the skin every 12 (twelve) hours. Please refill with 90 day supply 05/16/22   Orson Slick, MD  omeprazole (PRILOSEC) 20 MG capsule Take 1 capsule (20 mg total) by mouth daily. 08/27/22   Teodora Medici, FNP    Family History Family History  Adopted: Yes  Problem Relation Age of Onset   Alcoholism Mother    Diabetes  Mother    High blood pressure Mother    Alcoholism Father    Cancer Maternal Grandmother    Venous thrombosis Neg Hx     Social History Social History   Tobacco Use   Smoking status: Former    Types: E-cigarettes, Cigarettes   Smokeless tobacco: Never  Vaping Use   Vaping Use: Former  Substance Use Topics   Alcohol use: Not Currently   Drug use: Not Currently     Allergies   Patient has no known allergies.   Review of Systems Review of Systems  Constitutional:  Positive for chills and fever.  HENT:  Positive for congestion and sore throat. Negative for ear pain.   Eyes:  Negative for discharge and redness.  Respiratory:  Positive for cough. Negative for shortness of breath.   Gastrointestinal:  Negative for abdominal pain, nausea and vomiting.  Musculoskeletal:  Positive for myalgias.     Physical Exam Triage Vital Signs ED Triage Vitals  Enc Vitals Group     BP      Pulse      Resp      Temp      Temp src      SpO2      Weight      Height      Head Circumference      Peak Flow      Pain Score      Pain Loc      Pain Edu?      Excl. in [place]?    No data found.  Updated Vital Signs BP 113/77   Pulse (!) 106   Temp 98.2 F (36.8 C)   Resp 19   SpO2 98%     Physical Exam Vitals and nursing note reviewed.  Constitutional:      General: He is not in acute distress.    Appearance: Normal appearance. He is not ill-appearing.  HENT:     Head: Normocephalic and atraumatic.     Nose: Congestion present.     Mouth/Throat:     Mouth: Mucous membranes are moist.     Pharynx: Oropharynx is clear. Posterior oropharyngeal erythema present. No oropharyngeal exudate.     Comments: Minimal white coloration noted to tongue- no plaques noted or other oral white patches present Eyes:     Conjunctiva/sclera: Conjunctivae normal.  Cardiovascular:     Rate and Rhythm: Normal rate and regular rhythm.     Heart sounds: Normal heart sounds. No murmur  heard. Pulmonary:     Effort: Pulmonary effort is normal. No respiratory distress.     Breath sounds: Normal breath sounds. No wheezing, rhonchi or rales.  Skin:    General: Skin is warm and dry.  Neurological:     Mental Status: He is alert.  Psychiatric:        Mood and Affect: Mood normal.        Thought Content: Thought content normal.      UC Treatments / Results  Labs (all labs ordered are listed, but only abnormal results are displayed) Labs Reviewed  CULTURE, GROUP A STREP ([place])  SARS CORONAVIRUS 2 (TAT 6-24 HRS)  POCT RAPID STREP A (OFFICE)    EKG   Radiology No results found.  Procedures Procedures (including critical care time)  Medications Ordered in UC Medications - No data to display  Initial Impression / Assessment and Plan / UC Course  I have reviewed the triage vital signs and the nursing notes.  Pertinent labs & imaging results that were available during my care of the patient were reviewed by me and considered in my medical decision making (see chart for details).   Strep screening negative in office. Symptoms most consistent with viral illness. No signs of thrush at this time- white coating not noted on exam other than mild white coloration to tongue. Will screen for covid and discussed possibility of influenza. Unable to screen for flu given lack of supplies for same. Recommended symptomatic treatment, increased fluids and rest.   Final Clinical Impressions(s) / UC Diagnoses   Final diagnoses:  Acute pharyngitis, unspecified etiology  Acute upper respiratory infection  Encounter for screening for COVID-19   Discharge Instructions   None    ED Prescriptions   None    PDMP not reviewed this encounter.   Francene Finders, PA-C 12/06/22 1006

## 2022-12-06 NOTE — ED Triage Notes (Signed)
Pt presents to uc with co  of fevers up to 102  for 3 days. Pt reports sore throat,bad breath and white mouth with body aches and weakness. Pt reports tylenol for fevers. Pt reports no other sick contacts

## 2022-12-07 LAB — SARS CORONAVIRUS 2 (TAT 6-24 HRS): SARS Coronavirus 2: NEGATIVE

## 2022-12-08 LAB — CULTURE, GROUP A STREP (THRC)

## 2023-01-21 DIAGNOSIS — D751 Secondary polycythemia: Secondary | ICD-10-CM | POA: Diagnosis not present

## 2023-04-03 ENCOUNTER — Telehealth: Payer: Self-pay | Admitting: Hematology and Oncology

## 2023-04-29 ENCOUNTER — Inpatient Hospital Stay: Payer: BC Managed Care – PPO | Admitting: Hematology and Oncology

## 2023-04-29 ENCOUNTER — Inpatient Hospital Stay: Payer: BC Managed Care – PPO | Attending: Hematology and Oncology

## 2023-04-29 NOTE — Progress Notes (Unsigned)
[facility] [phone]   [phone]  PROGRESS NOTE  Patient Care Team: Shirline Frees, NP as PCP - General (Family Medicine) Patient, No Pcp Per (General Practice)  Hematological/Oncological History # Extensive Left Lower Extremity DVT, Provoked # Extensive VTEs require thrombolysis while on Eliquis therapy 1) 03/16/2020: presented to the [facility] ED with leg pain/swelling. [place] LE showed acute deep vein thrombosis involving the left common femoral vein, left femoral vein, and left popliteal vein. Started on anticoagulation therapy. Admitted 4/21-4/24/2021. D/c on Xarelto 2) 03/17/2020: CTA shows no evidence of PE 3) 03/30/2020: patient presented to ED with worsening pain. Admitted again and underwent peripheral vascular thrombectomy. Transitioned to Apixaban. 4) 04/11/2020: establish care with Dr. Leonides Schanz  5) 06/21/2020-06/26/2020: developed new VTEs requiring hospitalization and thrombolysis/mechanical thrombectomy of the left external iliac vein, common iliac vein, and IVC with vascular surgery on 06/23/2020. He was transitioned to coumadin at that time. 6) 07/04/2020: patient therapeutic on coumadin at INR 2.3, underwent US lower extremity with showed new thrombus in the distal IVC, external iliac and internal iliac veins. 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 male with medical history significant for extensive provoked LLE DVT who presents for a follow up visit. The patient's last visit was on 05/16/2022. In the interim since the last visit Noah Moore has continued lovenox therapy without difficulty.  He is following with Dr. Anabel Bene at [facility].  On exam  today Noah Moore reports ***  He notes otherwise he has been well and has not been having any issues with fevers, chills, sweats, nausea, vomiting or diarrhea.  A full 10 point ROS is listed below.   MEDICAL HISTORY:  Past Medical History:  Diagnosis Date   Allergy    DVT (deep venous thrombosis) (HCC)    DVT (deep venous thrombosis) (HCC) 02/2020   GERD (gastroesophageal reflux disease)     SURGICAL HISTORY: Past Surgical History:  Procedure Laterality Date   CORONARY ULTRASOUND/IVUS N/A 03/30/2020   Procedure: INTRAVASCULAR ULTRASOUND/IVUS;  Surgeon: Cephus Shelling, MD;  [facility];  Service: Cardiovascular;  Laterality: N/A;   CORONARY ULTRASOUND/IVUS N/A 06/24/2020   Procedure: Intravascular Ultrasound/IVUS;  Surgeon: Cephus Shelling, MD;  Location: [facility];  Service: Cardiovascular;  Laterality: N/A;   IVC VENOGRAPHY N/A 03/30/2020   Procedure: IVC Venography;  Surgeon: Cephus Shelling, MD;  [facility];  Service: Cardiovascular;  Laterality: N/A;   KNEE SURGERY Right    LEG SURGERY     Reports history of right lower extremity tibia fracture in 2010 which was repaired surgically.   LOWER EXTREMITY VENOGRAPHY Left 03/30/2020   Procedure: LOWER EXTREMITY VENOGRAPHY;  Surgeon: Cephus Shelling, MD;  [facility];  Service: Cardiovascular;  Laterality: Left;   LOWER EXTREMITY VENOGRAPHY  06/23/2020   Procedure: LOWER EXTREMITY VENOGRAPHY;  Surgeon: Cephus Shelling, MD;  [facility];  Service: Cardiovascular;;   PERIPHERAL VASCULAR THROMBECTOMY Left 03/30/2020   Procedure: PERIPHERAL VASCULAR THROMBECTOMY;  Surgeon: Cephus Shelling, MD;  [facility];  Service: Cardiovascular;  Laterality: Left;   PERIPHERAL VASCULAR THROMBECTOMY N/A 06/24/2020   Procedure: PERIPHERAL VASCULAR THROMBECTOMY;  Surgeon: Cephus Shelling, MD;  [facility];  Service:  Cardiovascular;  Laterality: N/A;   THROMBECTOMY  03/2020   ULTRASOUND GUIDANCE FOR VASCULAR ACCESS N/A 07/28/2020   Procedure: ULTRASOUND GUIDANCE ACCESS OF RIGHT INTERNAL JUGULAR, ULTRASOUND GUIDANCE ACCESS OF POSTERIOR TIBIAL VEIN;  Surgeon: Maeola Harman, MD;  Location: [facility] OR;  Service: Vascular;  Laterality: N/A;   VENOGRAM Left 07/28/2020   Procedure: CENTRAL VENOGRAM AND LEFT LOWER EXTREMITY VENOGRAM;  Surgeon: Maeola Harman, MD;  [facility];  Service: Vascular;  Laterality: Left;   WISDOM TOOTH EXTRACTION      SOCIAL HISTORY: Social History   Socioeconomic History   Marital status: Single    Spouse name: Not on file   Number of children: Not on file   Years of education: Not on file   Highest education level: Not on file  Occupational History   Not on file  Tobacco Use   Smoking status: Former    Types: E-cigarettes, Cigarettes   Smokeless tobacco: Never  Vaping Use   Vaping Use: Former  Substance and Sexual Activity   Alcohol use: Not Currently   Drug use: Not Currently   Sexual activity: Yes    Birth control/protection: None  Other Topics Concern   Not on file  Social History Narrative   ** Merged History Encounter **       Social Determinants of Health   Financial Resource Strain: Not on file  Food Insecurity: Not on file  Transportation Needs: Not on file  Physical Activity: Not on file  Stress: Not on file  Social Connections: Not on file  Intimate Partner Violence: Not on file    FAMILY HISTORY: Family History  Adopted: Yes  Problem Relation Age of Onset   Alcoholism Mother    Diabetes Mother    High blood pressure Mother    Alcoholism Father    Cancer Maternal Grandmother    Venous thrombosis Neg Hx     ALLERGIES:  has No Known Allergies.  MEDICATIONS:  Current Outpatient Medications  Medication Sig Dispense Refill   enoxaparin (LOVENOX) 150 MG/ML injection Inject 0.86 mLs (130 mg total) into the skin every 12  (twelve) hours. Please refill with 90 day supply 60 mL 4   omeprazole (PRILOSEC) 20 MG capsule Take 1 capsule (20 mg total) by mouth daily. 30 capsule 0   No current facility-administered medications for this visit.    REVIEW OF SYSTEMS:   Constitutional: ( - ) fevers, ( - )  chills , ( - ) night sweats Eyes: ( - ) blurriness of vision, ( - ) double vision, ( - ) watery eyes Ears, nose, mouth, throat, and face: ( - ) mucositis, ( - ) sore throat Respiratory: ( - ) cough, ( - ) dyspnea, ( - ) wheezes Cardiovascular: ( - ) palpitation, ( - ) chest discomfort, ( - ) lower extremity swelling Gastrointestinal:  ( - ) nausea, ( - ) heartburn, ( - ) change in bowel habits Skin: ( - ) abnormal skin rashes Lymphatics: ( - ) new lymphadenopathy, ( - ) easy bruising Neurological: ( - ) numbness, ( - ) tingling, ( - ) new weaknesses Behavioral/Psych: ( - ) mood change, ( - ) new changes  All other systems were reviewed with the patient and are negative.  PHYSICAL EXAMINATION: ECOG PERFORMANCE STATUS: 0 - Asymptomatic  There were no vitals filed for this visit.  There were no vitals filed for this visit.   GENERAL: well appearing young  male,  alert, no distress and comfortable SKIN: No skin lesions noted.  EYES: conjunctiva are pink and non-injected, sclera clear LUNGS: clear to auscultation and percussion with normal breathing effort HEART: regular rate & rhythm and no murmurs. Musculoskeletal: no cyanosis of digits and no clubbing. No LE edema. No erythema or tenderness of the LLE.  PSYCH: alert & oriented x 3, fluent speech NEURO: no focal motor/sensory deficits  LABORATORY DATA:  I have reviewed the data as listed    Latest Ref Rng & Units 05/16/2022    9:38 AM 11/15/2021   10:03 AM 06/24/2021   12:58 AM  CBC  WBC 4.0 - 10.5 K/uL 5.0  8.9  13.4   Hemoglobin 13.0 - 17.0 g/dL 16.1  09.6  04.5   Hematocrit 39.0 - 52.0 % 50.0  49.5  44.1   Platelets 150 - 400 K/uL 299  353  280         Latest Ref Rng & Units 05/16/2022    9:38 AM 11/15/2021   10:03 AM 06/24/2021   12:58 AM  CMP  Glucose 70 - 99 mg/dL 79  94  97   BUN 6 - 20 mg/dL 6  6  5    Creatinine 0.61 - 1.24 mg/dL 4.09  8.11  9.14   Sodium 135 - 145 mmol/L 140  139  138   Potassium 3.5 - 5.1 mmol/L 4.1  3.8  3.5   Chloride 98 - 111 mmol/L 105  105  102   CO2 22 - 32 mmol/L 29  27  27    Calcium 8.9 - 10.3 mg/dL 9.3  9.5  9.7   Total Protein 6.5 - 8.1 g/dL 8.1  8.0  8.0   Total Bilirubin 0.3 - 1.2 mg/dL 0.4  0.7  0.5   Alkaline Phos 38 - 126 U/L 81  88  73   AST 15 - 41 U/L 19  11  14    ALT 0 - 44 U/L 22  14  14      RADIOGRAPHIC STUDIES: No results found.  ASSESSMENT & PLAN Noah Moore 32 y.o. male with medical history significant for extensive provoked LLE DVT who presents for a follow up visit.  After review the labs, discussion with the patient, and review of the prior records the patient's findings are most consistent with recurrent VTEs of unclear etiology.  On exam today Noah Moore is doing well overall from a thrombosis standpoint.  He is not having any further swelling or discomfort in his lower extremities.  He does however have issues with taking the Lovenox including bruising, nodules, and the inability to administer the shots himself.  He is eager for an alternative form of anticoagulation.  We discussed in detail previously that he has failed therapy on Eliquis, Xarelto, and Coumadin.  It would be most appropriate for him to continue with the Lovenox, though I did note that with his referral to Dr. Anabel Bene he may be offered other suggestions for how to treat this thrombosis.  During the patient's prior hospitalization he was seen by Dr. Sherald Hess with vascular surgery who performed thrombolysis and percutaneous mechanical thrombectomy of the left external iliac, common iliac, distal IVC on 06/24/2020.  Due to concern the fact that the occurred while on anticoagulation therapy with  Eliquis he was transitioned to Coumadin therapy at time of discharge and is subsequently established with Coumadin clinic in the outpatient setting.  After d/c of Lovenox on Saturday the patient began to have worsening  leg swelling and developed tender erythematous raised lesions. These have subsequently resolved. 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 Patient appears stable on lovenox at this time.  --prior hypercoagulation/inflammatory workups were unrevealing. --will also attempt to r/o malignancy with a testicular US (prior imaging of Chest (02/2020) and abdomen/pelvis (05/2020) showed no evidence of disease)  --patient has connected with Dr. Anabel Bene at [facility]. His last visit was 03/30/2022.  --vascular surgery evaluated for a compressive syndrome (such as May Thurner). No evidence of this was found. --Labs today show creatinine *** --RTC in 6 months time  No orders of the defined types were placed in this encounter.   All questions were answered. The patient knows to call the clinic  with any problems, questions or concerns.  A total of more than 30 minutes were spent on this encounter and over half of that time was spent on counseling and coordination of care as outlined above.   Ulysees Barns, MD Department of Hematology/Oncology [facility] Phone: [phone] Pager: [phone] Email: [email or web address]  04/29/2023 7:25 AM

## 2023-07-26 DIAGNOSIS — G4733 Obstructive sleep apnea (adult) (pediatric): Secondary | ICD-10-CM | POA: Insufficient documentation

## 2023-08-16 DIAGNOSIS — I87009 Postthrombotic syndrome without complications of unspecified extremity: Secondary | ICD-10-CM | POA: Diagnosis not present

## 2023-08-16 DIAGNOSIS — I82402 Acute embolism and thrombosis of unspecified deep veins of left lower extremity: Secondary | ICD-10-CM | POA: Diagnosis not present

## 2023-08-16 DIAGNOSIS — Z7901 Long term (current) use of anticoagulants: Secondary | ICD-10-CM | POA: Diagnosis not present

## 2023-09-08 DIAGNOSIS — G4733 Obstructive sleep apnea (adult) (pediatric): Secondary | ICD-10-CM | POA: Diagnosis not present

## 2023-11-15 DIAGNOSIS — I82402 Acute embolism and thrombosis of unspecified deep veins of left lower extremity: Secondary | ICD-10-CM | POA: Diagnosis not present

## 2023-11-15 DIAGNOSIS — Z7901 Long term (current) use of anticoagulants: Secondary | ICD-10-CM | POA: Diagnosis not present

## 2023-12-04 ENCOUNTER — Telehealth: Payer: Self-pay | Admitting: Adult Health

## 2023-12-04 NOTE — Telephone Encounter (Signed)
 Lmom for pt to sch cpe or office vist its been over 1 yr since pt seen provider

## 2024-04-03 ENCOUNTER — Ambulatory Visit: Payer: Self-pay

## 2024-04-03 ENCOUNTER — Encounter: Payer: Self-pay | Admitting: Adult Health

## 2024-04-03 ENCOUNTER — Ambulatory Visit: Admitting: Adult Health

## 2024-04-03 VITALS — BP 160/110 | HR 95 | Temp 98.0°F

## 2024-04-03 DIAGNOSIS — T148XXA Other injury of unspecified body region, initial encounter: Secondary | ICD-10-CM

## 2024-04-03 MED ORDER — CYCLOBENZAPRINE HCL 10 MG PO TABS: 10.0000 mg | ORAL_TABLET | Freq: Every day | ORAL | 0 refills | Status: DC

## 2024-04-03 MED ORDER — METHYLPREDNISOLONE 4 MG PO TBPK: ORAL_TABLET | ORAL | 0 refills | Status: DC

## 2024-04-03 NOTE — Telephone Encounter (Signed)
 Copied from CRM [id number]. Topic: Clinical - Red Word Triage >> Apr 03, 2024  7:53 AM Kita Perish H wrote: [facility] that prompted transfer to Nurse Triage: Bent over to pick baby up and got a sharp pain in back, can't walk has to hold on to something  Chief Complaint: back pain Symptoms: 10/10 back pain with movement and more tolerable pain without movement  Frequency: today Pertinent Negatives: Patient denies numbness and tingling Disposition: [] ED /[] Urgent Care (no appt availability in office) / [x] Appointment(In office/virtual)/ []  [facility]/ [] Home Care/ [] Refused Recommended Disposition /[] [facility]/ []  Follow-up with PCP Additional Notes: pt bent over to pick up 33 year old and severe pain started in back and radiated into right side and down right leg. Pt fell to floor in fetal position do to pain. Wife reports no movement makes pain better.  In office visit scheduled for today.  Reason for Disposition  [1] SEVERE back pain (e.g., excruciating, unable to do any normal activities) AND [2] not improved 2 hours after pain medicine  Answer Assessment - Initial Assessment Questions 1. ONSET: "When did the pain begin?"      today 2. LOCATION: "Where does it hurt?" (upper, mid or lower back)     Middle back radiated into right side and into leg 3. SEVERITY: "How bad is the pain?"  (e.g., Scale 1-10; mild, moderate, or severe)   - MILD (1-3): Doesn't interfere with normal activities.    - MODERATE (4-7): Interferes with normal activities or awakens from sleep.    - SEVERE (8-10): Excruciating pain, unable to do any normal activities.      severe 4. PATTERN: "Is the pain constant?" (e.g., yes, no; constant, intermittent)      constant 5. RADIATION: "Does the pain shoot into your legs or somewhere else?"     radiated into right side and into leg 6. CAUSE:  "What do you think is causing the back pain?"      Beginning over picking up 31 year old 3. BACK OVERUSE:  "Any  recent lifting of heavy objects, strenuous work or exercise?"     lifting 8. MEDICINES: "What have you taken so far for the pain?" (e.g., nothing, acetaminophen , NSAIDS)     no 9. NEUROLOGIC SYMPTOMS: "Do you have any weakness, numbness, or problems with bowel/bladder control?"     no 10. OTHER SYMPTOMS: "Do you have any other symptoms?" (e.g., fever, abdomen pain, burning with urination, blood in urine)       no 11. PREGNANCY: "Is there any chance you are pregnant?" "When was your last menstrual period?"       N/a  Protocols used: Back Pain-A-AH

## 2024-04-03 NOTE — Telephone Encounter (Signed)
 Noted.

## 2024-04-03 NOTE — Progress Notes (Signed)
 Subjective:    Patient ID: Noah Moore, male    DOB: [birth year], 33 y.o.   MRN: [id number]  HPI 33 year old male who  has a past medical history of Allergy, DVT (deep venous thrombosis) (HCC), DVT (deep venous thrombosis) (HCC) (02/2020), and GERD (gastroesophageal reflux disease).  He presents to the office today for an acute visit.  He reports that for the last week he has felt some mild low back pain/muscle spasms after helping his in-laws move.  This morning he went to pick up his young child and his back locked up, he fell to the ground and had to scoot to some furniture to get back up.  Since that time he has had low back pain, muscle spasms and has been experiencing intermittent episodes of shooting pain down both sides of his legs.  He is on anticoagulation. He has not taken any OTC medications    Review of Systems See HPI   Past Medical History:  Diagnosis Date   Allergy    DVT (deep venous thrombosis) (HCC)    DVT (deep venous thrombosis) (HCC) 02/2020   GERD (gastroesophageal reflux disease)     Social History   Socioeconomic History   Marital status: Single    Spouse name: Not on file   Number of children: Not on file   Years of education: Not on file   Highest education level: Not on file  Occupational History   Not on file  Tobacco Use   Smoking status: Former    Types: E-cigarettes, Cigarettes   Smokeless tobacco: Never  Vaping Use   Vaping status: Former  Substance and Sexual Activity   Alcohol use: Not Currently   Drug use: Not Currently   Sexual activity: Yes    Birth control/protection: None  Other Topics Concern   Not on file  Social History Narrative   ** Merged History Encounter **       Social Drivers of Health   Financial Resource Strain: Not on file  Food Insecurity: Not on file  Transportation Needs: Not on file  Physical Activity: Not on file  Stress: Not on file  Social Connections: Unknown (03/30/2022)   Received from  [facility], [facility]   Social Network    Social Network: Not on file  Intimate Partner Violence: Unknown (03/01/2022)   Received from [facility], [facility]   HITS    Physically Hurt: Not on file    Insult or Talk Down To: Not on file    Threaten Physical Harm: Not on file    Scream or Curse: Not on file    Past Surgical History:  Procedure Laterality Date   CORONARY ULTRASOUND/IVUS N/A 03/30/2020   Procedure: INTRAVASCULAR ULTRASOUND/IVUS;  Surgeon: Young Hensen, MD;  Location: [facility];  Service: Cardiovascular;  Laterality: N/A;   CORONARY ULTRASOUND/IVUS N/A 06/24/2020   Procedure: Intravascular Ultrasound/IVUS;  Surgeon: Young Hensen, MD;  Location: [facility];  Service: Cardiovascular;  Laterality: N/A;   IVC VENOGRAPHY N/A 03/30/2020   Procedure: IVC Venography;  Surgeon: Young Hensen, MD;  Location: [facility];  Service: Cardiovascular;  Laterality: N/A;   KNEE SURGERY Right    LEG SURGERY     Reports history of right lower extremity tibia fracture in 2010 which was repaired surgically.   LOWER EXTREMITY VENOGRAPHY Left 03/30/2020   Procedure: LOWER EXTREMITY VENOGRAPHY;  Surgeon: Young Hensen, MD;  Location: [facility];  Service: Cardiovascular;  Laterality: Left;   LOWER EXTREMITY VENOGRAPHY  06/23/2020   Procedure: LOWER EXTREMITY VENOGRAPHY;  Surgeon: Young Hensen, MD;  Location: [facility];  Service: Cardiovascular;;   PERIPHERAL VASCULAR THROMBECTOMY Left 03/30/2020   Procedure: PERIPHERAL VASCULAR THROMBECTOMY;  Surgeon: Young Hensen, MD;  Location: [facility];  Service: Cardiovascular;  Laterality: Left;   PERIPHERAL VASCULAR THROMBECTOMY N/A 06/24/2020   Procedure: PERIPHERAL VASCULAR THROMBECTOMY;  Surgeon: Young Hensen, MD;  Location: [facility];  Service: Cardiovascular;  Laterality: N/A;   THROMBECTOMY  03/2020   ULTRASOUND GUIDANCE FOR VASCULAR  ACCESS N/A 07/28/2020   Procedure: ULTRASOUND GUIDANCE ACCESS OF RIGHT INTERNAL JUGULAR, ULTRASOUND GUIDANCE ACCESS OF POSTERIOR TIBIAL VEIN;  Surgeon: Adine Hoof, MD;  Location: [facility] OR;  Service: Vascular;  Laterality: N/A;   VENOGRAM Left 07/28/2020   Procedure: CENTRAL VENOGRAM AND LEFT LOWER EXTREMITY VENOGRAM;  Surgeon: Adine Hoof, MD;  Location: [facility] OR;  Service: Vascular;  Laterality: Left;   WISDOM TOOTH EXTRACTION      Family History  Adopted: Yes  Problem Relation Age of Onset   Alcoholism Mother    Diabetes Mother    High blood pressure Mother    Alcoholism Father    Cancer Maternal Grandmother    Venous thrombosis Neg Hx     No Known Allergies  Current Outpatient Medications on File Prior to Visit  Medication Sig Dispense Refill   cetirizine  (ZYRTEC ) 10 MG tablet Take 10 mg by mouth.     fondaparinux (ARIXTRA) 10 MG/0.8ML SOLN injection Inject 10 mg into the skin.     omeprazole  (PRILOSEC) 20 MG capsule Take 1 capsule (20 mg total) by mouth daily. 30 capsule 0   No current facility-administered medications on file prior to visit.    BP (!) 160/110   Pulse 95   Temp 98 F (36.7 C) (Oral)   SpO2 98%       Objective:   Physical Exam Vitals and nursing note reviewed.  Constitutional:      Appearance: Normal appearance. He is obese.  Musculoskeletal:     Lumbar back: Spasms and tenderness present. No swelling or bony tenderness. Decreased range of motion.       Back:     Right lower leg: Edema present.  Skin:    General: Skin is warm and dry.  Neurological:     General: No focal deficit present.     Mental Status: He is alert and oriented to person, place, and time.  Psychiatric:        Mood and Affect: Mood normal.        Behavior: Behavior normal.        Thought Content: Thought content normal.        Judgment: Judgment normal.        Assessment & Plan:  1. Muscle strain (Primary) - I will send in Flexeril and a Medrol  Dosepak for lower lumbar muscle strain.  Encouraged heat and stretching.  If no improvement in the next 3 to 4 days can consider sending to physical therapy - cyclobenzaprine (FLEXERIL) 10 MG tablet; Take 1 tablet (10 mg total) by mouth at bedtime.  Dispense: 15 tablet; Refill: 0 - methylPREDNISolone (MEDROL DOSEPAK) 4 MG TBPK tablet; Take as directed  Dispense: 21 tablet; Refill: 0  Alto Atta, NP

## 2024-04-13 ENCOUNTER — Encounter: Payer: Self-pay | Admitting: Adult Health

## 2024-04-14 NOTE — Telephone Encounter (Signed)
**Note De-identified  [place] Obfuscation** Please advise 

## 2024-04-19 ENCOUNTER — Other Ambulatory Visit: Payer: Self-pay | Admitting: Adult Health

## 2024-04-19 DIAGNOSIS — T148XXA Other injury of unspecified body region, initial encounter: Secondary | ICD-10-CM

## 2024-05-05 ENCOUNTER — Encounter: Payer: Self-pay | Admitting: Adult Health

## 2024-05-05 ENCOUNTER — Ambulatory Visit (INDEPENDENT_AMBULATORY_CARE_PROVIDER_SITE_OTHER): Admitting: Adult Health

## 2024-05-05 VITALS — BP 120/98 | HR 93 | Temp 98.3°F | Ht 75.0 in | Wt 336.0 lb

## 2024-05-05 DIAGNOSIS — Z0000 Encounter for general adult medical examination without abnormal findings: Secondary | ICD-10-CM

## 2024-05-05 DIAGNOSIS — G4733 Obstructive sleep apnea (adult) (pediatric): Secondary | ICD-10-CM | POA: Diagnosis not present

## 2024-05-05 DIAGNOSIS — I82402 Acute embolism and thrombosis of unspecified deep veins of left lower extremity: Secondary | ICD-10-CM | POA: Diagnosis not present

## 2024-05-05 LAB — CBC
HCT: 45 % (ref 39.0–52.0)
Hemoglobin: 15.3 g/dL (ref 13.0–17.0)
MCHC: 34 g/dL (ref 30.0–36.0)
MCV: 87 fl (ref 78.0–100.0)
Platelets: 336 10*3/uL (ref 150.0–400.0)
RBC: 5.17 Mil/uL (ref 4.22–5.81)
RDW: 13 % (ref 11.5–15.5)
WBC: 10.1 10*3/uL (ref 4.0–10.5)

## 2024-05-05 LAB — LIPID PANEL
Cholesterol: 178 mg/dL (ref 0–200)
HDL: 48.4 mg/dL (ref >39.00)
LDL Cholesterol: 96 mg/dL (ref 0–99)
NonHDL: 129.78
Total CHOL/HDL Ratio: 4
Triglycerides: 170 mg/dL — ABNORMAL HIGH (ref 0.0–149.0)
VLDL: 34 mg/dL (ref 0.0–40.0)

## 2024-05-05 LAB — COMPREHENSIVE METABOLIC PANEL WITH GFR
ALT: 31 U/L (ref 0–53)
AST: 20 U/L (ref 0–37)
Albumin: 4.8 g/dL (ref 3.5–5.2)
Alkaline Phosphatase: 87 U/L (ref 39–117)
BUN: 12 mg/dL (ref 6–23)
CO2: 28 meq/L (ref 19–32)
Calcium: 9.8 mg/dL (ref 8.4–10.5)
Chloride: 102 meq/L (ref 96–112)
Creatinine, Ser: 0.88 mg/dL (ref 0.40–1.50)
GFR: 114.72 mL/min (ref >60.00)
Glucose, Bld: 93 mg/dL (ref 70–99)
Potassium: 3.9 meq/L (ref 3.5–5.1)
Sodium: 137 meq/L (ref 135–145)
Total Bilirubin: 0.5 mg/dL (ref 0.2–1.2)
Total Protein: 8.2 g/dL (ref 6.0–8.3)

## 2024-05-05 LAB — HEMOGLOBIN A1C: Hgb A1c MFr Bld: 5.6 % (ref 4.6–6.5)

## 2024-05-05 NOTE — Progress Notes (Signed)
 Subjective:    Patient ID: Noah Moore, male    DOB: 1990/12/16, 33 y.o.   MRN: [id number]  HPI Patient presents for yearly preventative medicine examination. He is a 32 year old male who  has a past medical history of Allergy, DVT (deep venous thrombosis) (HCC), DVT (deep venous thrombosis) (HCC) (02/2020), and GERD (gastroesophageal reflux disease).  DVT -he has a history of recurrent left lower extremity DVT.  Symptoms started around [place] weekend 2021.  He has been on multiple anticoagulations the last few years.  He is currently being seen at [facility] hematology and is prescribed Arixtra 10 mg injection daily.  OSA- He has a history of OSA. Sleep study in 12/2022 showed 1) Mild to moderate obstructive sleep apnea (REI 16.1/hr)  2) Apnea was worse during supine sleep (REI 30/hr). He never had a follow up to get his CPAP.   Obesity - he has been working on weight loss, doing exercises to strengthen his core. He is also eating healthier.  Wt Readings from Last 3 Encounters:  05/05/24 (!) 336 lb (152.4 kg)  05/16/22 292 lb 14.4 oz (132.9 kg)  11/15/21 (!) 301 lb 3.2 oz (136.6 kg)     All immunizations and health maintenance protocols were reviewed with the patient and needed orders were placed.  Appropriate screening laboratory values were ordered for the patient including screening of hyperlipidemia, renal function and hepatic function.   Medication reconciliation,  past medical history, social history, problem list and allergies were reviewed in detail with the patient  Goals were established with regard to weight loss, exercise, and  diet in compliance with medications BP Readings from Last 3 Encounters:  05/05/24 (!) 120/98  04/03/24 (!) 160/110  12/06/22 113/77    Review of Systems  Constitutional: Negative.   HENT: Negative.    Eyes: Negative.   Respiratory: Negative.    Cardiovascular: Negative.   Gastrointestinal: Negative.   Endocrine: Negative.    Genitourinary: Negative.   Musculoskeletal: Negative.   Skin: Negative.   Allergic/Immunologic: Negative.   Neurological: Negative.   Hematological: Negative.   Psychiatric/Behavioral: Negative.    All other systems reviewed and are negative.  Past Medical History:  Diagnosis Date   Allergy    DVT (deep venous thrombosis) (HCC)    DVT (deep venous thrombosis) (HCC) 02/2020   GERD (gastroesophageal reflux disease)     Social History   Socioeconomic History   Marital status: Single    Spouse name: Not on file   Number of children: Not on file   Years of education: Not on file   Highest education level: Not on file  Occupational History   Not on file  Tobacco Use   Smoking status: Former    Types: E-cigarettes, Cigarettes   Smokeless tobacco: Never  Vaping Use   Vaping status: Some Days   Start date: 11/26/2017   Substances: Nicotine, Flavoring  Substance and Sexual Activity   Alcohol use: Not Currently    Alcohol/week: 17.0 standard drinks of alcohol    Types: 5 Cans of beer, 12 Shots of liquor per week   Drug use: Not Currently   Sexual activity: Yes    Birth control/protection: None  Other Topics Concern   Not on file  Social History Narrative   ** Merged History Encounter **       Social Drivers of Corporate investment banker Strain: Not on file  Food Insecurity: Not on file  Transportation Needs: Not on file  Physical Activity: Not on file  Stress: Not on file  Social Connections: Unknown (03/30/2022)   [facility]   Social Network    Social Network: Not on file  Intimate Partner Violence: Unknown (03/01/2022)   Received from [facility], [facility]   HITS    Physically Hurt: Not on file    Insult or Talk Down To: Not on file    Threaten Physical Harm: Not on file    Scream or Curse: Not on file    Past Surgical History:  Procedure Laterality Date   CORONARY ULTRASOUND/IVUS N/A 03/30/2020   Procedure: INTRAVASCULAR  ULTRASOUND/IVUS;  Surgeon: Young Hensen, MD;  Location: [facility] INVASIVE CV LAB;  Service: Cardiovascular;  Laterality: N/A;   CORONARY ULTRASOUND/IVUS N/A 06/24/2020   Procedure: Intravascular Ultrasound/IVUS;  Surgeon: Young Hensen, MD;  Location: [facility];  Service: Cardiovascular;  Laterality: N/A;   IVC VENOGRAPHY N/A 03/30/2020   Procedure: IVC Venography;  Surgeon: Young Hensen, MD;  Location: [facility];  Service: Cardiovascular;  Laterality: N/A;   KNEE SURGERY Right    LEG SURGERY     Reports history of right lower extremity tibia fracture in 2010 which was repaired surgically.   LOWER EXTREMITY VENOGRAPHY Left 03/30/2020   Procedure: LOWER EXTREMITY VENOGRAPHY;  Surgeon: Young Hensen, MD;  Location: [facility];  Service: Cardiovascular;  Laterality: Left;   LOWER EXTREMITY VENOGRAPHY  06/23/2020   Procedure: LOWER EXTREMITY VENOGRAPHY;  Surgeon: Young Hensen, MD;  Location: [facility];  Service: Cardiovascular;;   PERIPHERAL VASCULAR THROMBECTOMY Left 03/30/2020   Procedure: PERIPHERAL VASCULAR THROMBECTOMY;  Surgeon: Young Hensen, MD;  Location: [facility];  Service: Cardiovascular;  Laterality: Left;   PERIPHERAL VASCULAR THROMBECTOMY N/A 06/24/2020   Procedure: PERIPHERAL VASCULAR THROMBECTOMY;  Surgeon: Young Hensen, MD;  Location: [facility];  Service: Cardiovascular;  Laterality: N/A;   THROMBECTOMY  03/2020   ULTRASOUND GUIDANCE FOR VASCULAR ACCESS N/A 07/28/2020   Procedure: ULTRASOUND GUIDANCE ACCESS OF RIGHT INTERNAL JUGULAR, ULTRASOUND GUIDANCE ACCESS OF POSTERIOR TIBIAL VEIN;  Surgeon: Adine Hoof, MD;  Location: [facility] OR;  Service: Vascular;  Laterality: N/A;   VENOGRAM Left 07/28/2020   Procedure: CENTRAL VENOGRAM AND LEFT LOWER EXTREMITY VENOGRAM;  Surgeon: Adine Hoof, MD;  Location: [facility] OR;  Service: Vascular;  Laterality: Left;   WISDOM TOOTH EXTRACTION       Family History  Adopted: Yes  Problem Relation Age of Onset   Alcoholism Mother    Diabetes Mother    High blood pressure Mother    Alcoholism Father    Cancer Maternal Grandmother    Venous thrombosis Neg Hx     No Known Allergies  Current Outpatient Medications on File Prior to Visit  Medication Sig Dispense Refill   cetirizine  (ZYRTEC ) 10 MG tablet Take 10 mg by mouth.     cyclobenzaprine  (FLEXERIL ) 10 MG tablet TAKE 1 TABLET BY MOUTH EVERYDAY AT BEDTIME 15 tablet 0   fondaparinux (ARIXTRA) 10 MG/0.8ML SOLN injection Inject 10 mg into the skin.     omeprazole  (PRILOSEC) 20 MG capsule Take 1 capsule (20 mg total) by mouth daily. 30 capsule 0   No current facility-administered medications on file prior to visit.    BP (!) 120/98   Pulse 93   Temp 98.3 F (36.8 C) (Oral)   Ht 6\' 3"  (1.905 m)   Wt (!) 336 lb (  152.4 kg)   SpO2 97%   BMI 42.00 kg/m       Objective:   Physical Exam Vitals and nursing note reviewed.  Constitutional:      General: He is not in acute distress.    Appearance: Normal appearance. He is obese. He is not ill-appearing.  HENT:     Head: Normocephalic and atraumatic.     Right Ear: Tympanic membrane, ear canal and external ear normal. There is no impacted cerumen.     Left Ear: Tympanic membrane, ear canal and external ear normal. There is no impacted cerumen.     Nose: Nose normal. No congestion or rhinorrhea.     Mouth/Throat:     Mouth: Mucous membranes are moist.     Pharynx: Oropharynx is clear.  Eyes:     Extraocular Movements: Extraocular movements intact.     Conjunctiva/sclera: Conjunctivae normal.     Pupils: Pupils are equal, round, and reactive to light.  Neck:     Vascular: No carotid bruit.  Cardiovascular:     Rate and Rhythm: Normal rate and regular rhythm.     Pulses: Normal pulses.     Heart sounds: No murmur heard.    No friction rub. No gallop.  Pulmonary:     Effort: Pulmonary effort is normal.     Breath  sounds: Normal breath sounds.  Abdominal:     General: Abdomen is flat. Bowel sounds are normal. There is no distension.     Palpations: Abdomen is soft. There is no mass.     Tenderness: There is no abdominal tenderness. There is no guarding or rebound.     Hernia: No hernia is present.  Musculoskeletal:        General: Normal range of motion.     Cervical back: Normal range of motion and neck supple.  Lymphadenopathy:     Cervical: No cervical adenopathy.  Skin:    General: Skin is warm and dry.     Capillary Refill: Capillary refill takes less than 2 seconds.  Neurological:     General: No focal deficit present.     Mental Status: He is alert and oriented to person, place, and time.  Psychiatric:        Mood and Affect: Mood normal.        Behavior: Behavior normal.        Thought Content: Thought content normal.        Judgment: Judgment normal.        Assessment & Plan:  1. Routine general medical examination at a health care facility (Primary) Today patient counseled on age appropriate routine health concerns for screening and prevention, each reviewed and up to date or declined. Immunizations reviewed and up to date or declined. Labs ordered and reviewed. Risk factors for depression reviewed and negative. Hearing function and visual acuity are intact. ADLs screened and addressed as needed. Functional ability and level of safety reviewed and appropriate. Education, counseling and referrals performed based on assessed risks today. Patient provided with a copy of personalized plan for preventive services. - Follow up in one year or sooner if needed  2. 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 OSA (obstructive  sleep apnea) -  Call [facility] about CPAP - Lipid panel; Future - TSH; Future - CBC; Future - Comprehensive metabolic panel with GFR; Future - Hemoglobin A1c; Future - Hemoglobin A1c - Comprehensive metabolic panel with GFR - CBC - TSH - Lipid panel  4. Morbid obesity (HCC) - Will check and labs and see if we can get him started on Zepbound d/t h/o OSA - Lipid panel; Future - TSH; Future - CBC; Future - Comprehensive metabolic panel with GFR; Future - Hemoglobin A1c; Future - Hemoglobin A1c - Comprehensive metabolic panel with GFR - CBC - TSH - Lipid panel Alto Atta, NP

## 2024-05-05 NOTE — Patient Instructions (Addendum)
 Noah Moore

## 2024-05-07 LAB — TSH: TSH: 1.53 u[IU]/mL (ref 0.35–5.50)

## 2024-05-08 ENCOUNTER — Other Ambulatory Visit: Payer: Self-pay | Admitting: Adult Health

## 2024-05-08 ENCOUNTER — Ambulatory Visit: Payer: Self-pay | Admitting: Adult Health

## 2024-05-08 DIAGNOSIS — G4733 Obstructive sleep apnea (adult) (pediatric): Secondary | ICD-10-CM

## 2024-05-08 MED ORDER — ZEPBOUND 2.5 MG/0.5ML ~~LOC~~ SOAJ: 2.5000 mg | SUBCUTANEOUS | 0 refills | Status: DC

## 2024-05-08 MED ORDER — TIRZEPATIDE-WEIGHT MANAGEMENT 2.5 MG/0.5ML ~~LOC~~ SOLN: 2.5000 mg | SUBCUTANEOUS | 0 refills | Status: DC

## 2024-05-08 NOTE — Telephone Encounter (Signed)
 Tried to send in the pens but it looks like vials are preferred on insurance. Tried to call pt to see if this can be sent to another pharmacy but no answer.

## 2024-05-08 NOTE — Telephone Encounter (Signed)
 Noted

## 2024-05-18 ENCOUNTER — Other Ambulatory Visit (HOSPITAL_COMMUNITY): Payer: Self-pay

## 2024-05-20 ENCOUNTER — Other Ambulatory Visit (HOSPITAL_COMMUNITY): Payer: Self-pay

## 2024-06-04 NOTE — Telephone Encounter (Signed)
**Note De-identified  [place] Obfuscation** Please advise 

## 2024-06-08 ENCOUNTER — Encounter (HOSPITAL_BASED_OUTPATIENT_CLINIC_OR_DEPARTMENT_OTHER): Payer: Self-pay | Admitting: Family Medicine

## 2024-06-08 ENCOUNTER — Ambulatory Visit (HOSPITAL_BASED_OUTPATIENT_CLINIC_OR_DEPARTMENT_OTHER): Admitting: Family Medicine

## 2024-06-08 VITALS — BP 138/98 | HR 78 | Ht 75.0 in | Wt 340.0 lb

## 2024-06-08 DIAGNOSIS — I825Y9 Chronic embolism and thrombosis of unspecified deep veins of unspecified proximal lower extremity: Secondary | ICD-10-CM | POA: Diagnosis not present

## 2024-06-08 DIAGNOSIS — G4733 Obstructive sleep apnea (adult) (pediatric): Secondary | ICD-10-CM | POA: Diagnosis not present

## 2024-06-08 DIAGNOSIS — I82532 Chronic embolism and thrombosis of left popliteal vein: Secondary | ICD-10-CM | POA: Insufficient documentation

## 2024-06-08 DIAGNOSIS — I10 Essential (primary) hypertension: Secondary | ICD-10-CM | POA: Insufficient documentation

## 2024-06-08 MED ORDER — LOSARTAN POTASSIUM 25 MG PO TABS: 25.0000 mg | ORAL_TABLET | Freq: Every day | ORAL | 3 refills | Status: DC

## 2024-06-08 NOTE — Telephone Encounter (Signed)
 Please see mychart message sent by pt and advise.

## 2024-06-08 NOTE — Patient Instructions (Signed)
 Please obtain CPAP with Pulmonology

## 2024-06-08 NOTE — Progress Notes (Signed)
 New Patient Office Visit  Subjective:   Noah Moore [birth year] 06/08/2024  Chief Complaint  Patient presents with   New Patient (Initial Visit)    Patient is here today to get established with the practice. Wants to discuss medications to help with weight loss and also to help with OSA.    HPI: Noah Moore presents today to establish care at [facility] at [facility]. Introduced to Publishing rights manager role and practice setting.  All questions answered.   Last PCP: Darleene Shape, NP  Last annual physical: June 2025 Concerns: See below    OSA:  Patient did sleep study with [facility] and recommended to start CPAP. He states his previous PCP recommended Zepbound  to start weight loss for OSA. His insurnace did not approve of GLP 1 but he is interested in cash price/out of pocket cost.    WEIGHT MANAGEMENT: Noah Moore presents for weight management. He has noticed 1-2 lbs of weight loss with lifestyle changes and is deisiring to start GLP 1 Therapy.  Adhering to healthy diet:  Current dietary plan: Higher protein, lower carbohydrates, eating greens , increased water  Regular exercise regimen: Walking frequently  Medications tried in the past: None  Wt Readings from Last 3 Encounters:  06/08/24 (!) 340 lb (154.2 kg)  05/05/24 (!) 336 lb (152.4 kg)  05/16/22 292 lb 14.4 oz (132.9 kg)    HYPERTENSION: Noah Moore presents for the medical management of hypertension. He reports family hx of HTN in mother. He has never been on medication for HTN.   Patient is not regularly keeping a check on BP at home.  Adhering to low sodium diet: Yes Exercising Regularly: Yes Denies headache, dizziness, CP, SHOB, vision changes.    BP Readings from Last 3 Encounters:  06/08/24 (!) 138/98  05/05/24 (!) 120/98  04/03/24 (!) 160/110    CHRONIC DVT:  Patient has been seeing [facility] since  2021 for chronic DVT of lower extremity. He has tried and failed several anticoagulants. He is currently controlled on Fondaparinux (Arixtra) daily injections. He sees Hematology frequently for monitoring.   The following portions of the patient's history were reviewed and updated as appropriate: past medical history, past surgical history, family history, social history, allergies, medications, and problem list.   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   SIRS (systemic inflammatory response syndrome) (HCC) 03/17/2020   Paronychia of finger 03/17/2020   Thrombocytosis 03/17/2020   Sepsis (HCC) 03/17/2020   Past Medical History:  Diagnosis Date   Alcohol use 06/17/2017   Allergy    DVT (deep venous thrombosis) (HCC)    DVT (deep venous thrombosis) (HCC) 02/2020   Environmental allergies 08/04/2014   Epigastric pain 06/17/2017   GERD (gastroesophageal reflux disease)    Seasonal allergies 08/03/2015   Past Surgical History:  Procedure Laterality Date   CORONARY ULTRASOUND/IVUS N/A 03/30/2020   Procedure: INTRAVASCULAR ULTRASOUND/IVUS;  Surgeon: Gretta Lonni PARAS,  MD;  [facility]: [facility];  Service: Cardiovascular;  Laterality: N/A;   CORONARY ULTRASOUND/IVUS N/A 06/24/2020   Procedure: Intravascular Ultrasound/IVUS;  Surgeon: Gretta Lonni PARAS, MD;  [facility]: [facility];  Service: Cardiovascular;  Laterality: N/A;   IVC  VENOGRAPHY N/A 03/30/2020   Procedure: IVC Venography;  Surgeon: Gretta Lonni PARAS, MD;  [facility]: [facility];  Service: Cardiovascular;  Laterality: N/A;   KNEE SURGERY Right    LEG SURGERY     Reports history of right lower extremity tibia fracture in 2010 which was repaired surgically.   LOWER EXTREMITY VENOGRAPHY Left 03/30/2020   Procedure: LOWER EXTREMITY VENOGRAPHY;  Surgeon: Gretta Lonni PARAS, MD;  [facility];  Service: Cardiovascular;  Laterality: Left;   LOWER EXTREMITY VENOGRAPHY  06/23/2020   Procedure: LOWER EXTREMITY VENOGRAPHY;  Surgeon: Gretta Lonni PARAS, MD;  [facility];  Service: Cardiovascular;;   PERIPHERAL VASCULAR THROMBECTOMY Left 03/30/2020   Procedure: PERIPHERAL VASCULAR THROMBECTOMY;  Surgeon: Gretta Lonni PARAS, MD;  [facility];  Service: Cardiovascular;  Laterality: Left;   PERIPHERAL VASCULAR THROMBECTOMY N/A 06/24/2020   Procedure: PERIPHERAL VASCULAR THROMBECTOMY;  Surgeon: Gretta Lonni PARAS, MD;  [facility];  Service: Cardiovascular;  Laterality: N/A;   THROMBECTOMY  03/2020   ULTRASOUND GUIDANCE FOR VASCULAR ACCESS N/A 07/28/2020   Procedure: ULTRASOUND GUIDANCE ACCESS OF RIGHT INTERNAL JUGULAR, ULTRASOUND GUIDANCE ACCESS OF POSTERIOR TIBIAL VEIN;  Surgeon: Sheree Penne Lonni, MD;  Location: [facility] OR;  Service: Vascular;  Laterality: N/A;   VENOGRAM Left 07/28/2020   Procedure: CENTRAL VENOGRAM AND LEFT LOWER EXTREMITY VENOGRAM;  Surgeon: Sheree Penne Lonni, MD;  Location: [facility] OR;  Service: Vascular;  Laterality: Left;   WISDOM TOOTH EXTRACTION     Family History  Adopted: Yes  Problem Relation Age of Onset   Alcoholism Mother    Diabetes Mother    High blood pressure Mother    Alcoholism Father    Cancer Maternal Grandmother    Venous thrombosis Neg Hx    Social History   Socioeconomic History   Marital status: Single    Spouse name: Not on file   Number of  children: Not on file   Years of education: Not on file   Highest education level: Not on file  Occupational History   Not on file  Tobacco Use   Smoking status: Former    Types: E-cigarettes, Cigarettes   Smokeless tobacco: Never  Vaping Use   Vaping status: Some Days   Start date: 11/26/2017   Substances: Nicotine, Flavoring  Substance and Sexual Activity   Alcohol use: Not Currently    Alcohol/week: 17.0 standard drinks of alcohol    Types: 5 Cans of beer, 12 Shots of liquor per week   Drug use: Not Currently   Sexual activity: Yes    Birth control/protection: None  Other Topics Concern   Not on file  Social History Narrative   ** Merged History Encounter **       Social Drivers of Corporate investment banker Strain: Not on file  Food Insecurity: Not on file  Transportation Needs: Not on file  Physical Activity: Not on file  Stress: Not on file  Social Connections: Unknown (03/30/2022)   Received from [facility]   Social Network    Social Network: Not on file  Intimate Partner Violence: Unknown (03/01/2022)   Received from [facility]   HITS    Physically  Hurt: Not on file    Insult or Talk Down To: Not on file    Threaten Physical Harm: Not on file    Scream or Curse: Not on file   Outpatient Medications Prior to Visit  Medication Sig Dispense Refill   fondaparinux (ARIXTRA) 10 MG/0.8ML SOLN injection Inject 10 mg into the skin.     TIRZEPATIDE -WEIGHT MANAGEMENT [place] Inject into the skin. LipoSlim through [facility]: Inject 25 units into the skin once weekly. Titrate by 25 units every 4 weeks.     cetirizine  (ZYRTEC ) 10 MG tablet Take 10 mg by mouth.     cyclobenzaprine  (FLEXERIL ) 10 MG tablet TAKE 1 TABLET BY MOUTH EVERYDAY AT BEDTIME 15 tablet 0   omeprazole  (PRILOSEC) 20 MG capsule Take 1 capsule (20 mg total) by mouth daily. 30 capsule 0   tirzepatide  (ZEPBOUND ) 2.5 MG/0.5ML Pen INJECT 2.5 MG SUBCUTANEOUSLY WEEKLY 2 mL 0   No  facility-administered medications prior to visit.   No Known Allergies  ROS: A complete ROS was performed with pertinent positives/negatives noted in the HPI. The remainder of the ROS are negative.   Objective:   Today's Vitals   06/08/24 1009 06/08/24 1047 06/08/24 1055  BP: (!) 139/92 (!) 138/98 (!) 138/98  Pulse: 78    SpO2: 99%    Weight: (!) 340 lb (154.2 kg)    Height: 6' 3 (1.905 m)      GENERAL: Well-appearing, in NAD. Obese.  SKIN: Pink, warm and dry. No rash, lesion, ulceration, or ecchymoses.  Head: Normocephalic. NECK: Trachea midline. Full ROM w/o pain or tenderness.  RESPIRATORY: Chest wall symmetrical. Respirations even and non-labored. Breath sounds clear to auscultation bilaterally.  CARDIAC: S1, S2 present, regular rate and rhythm without murmur or gallops. Peripheral pulses 2+ bilaterally.  MSK: Muscle tone and strength appropriate for age.  NEUROLOGIC: No motor or sensory deficits. Steady, even gait. C2-C12 intact.  PSYCH/MENTAL STATUS: Alert, oriented x 3. Cooperative, appropriate mood and affect.   EKG 06/08/24:  Vent. rate 74 BPM PR interval 150 ms QRS duration 96 ms QT/QTcB 372/412 ms P-R-T axes 61 30 26 Normal sinus rhythm Normal ECG When compared with ECG of 24-Jun-2021 00:56, PREVIOUS ECG IS PRESENT    Assessment & Plan:  1. OSA (obstructive sleep apnea) (Primary) Uncontrolled. Recomment patient obtain CPAP from Pulmonary. May benefit from GLP1 and weight loss. See note below.   2. Morbid obesity (HCC) Discussed possible benefit with GLP 1 with patient and he would like to proceed with compound GLP 1 tirzepatide  with [facility]. Discussed risks and benefits. No hx of MEN2 or Thyroid  cancer. Discussed possible side effects, dietary changes and exercise recommended in conjunction and he verbalized understanding.   3. Chronic deep vein thrombosis (DVT) of proximal vein of lower extremity, unspecified laterality (HCC) Continue management with  [facility].   4. Essential hypertension Uncontrolled. EKG is unremarkable. Will start Losartan  25mg  daily and return in 6 weeks for BMP and BP check. Discussed monitoring BP and goal with home cuff.  - losartan  (COZAAR ) 25 MG tablet; Take 1 tablet (25 mg total) by mouth daily.  Dispense: 30 tablet; Refill: 3 - Basic Metabolic Panel (BMET); Future   Patient to reach out to office if new, worrisome, or unresolved symptoms arise or if no improvement in patient's condition. Patient verbalized understanding and is agreeable to treatment plan. All questions answered to patient's satisfaction.    Return for 6 weeks BP Check and Lab Only; 4 months Follow up  OSA/Weight .    Thersia Schuyler Stark, [place]

## 2024-06-24 NOTE — Telephone Encounter (Signed)
 Jon Gills, please see new message sent by pt and advise.

## 2024-07-20 ENCOUNTER — Other Ambulatory Visit (HOSPITAL_BASED_OUTPATIENT_CLINIC_OR_DEPARTMENT_OTHER): Payer: Self-pay | Admitting: *Deleted

## 2024-07-20 ENCOUNTER — Ambulatory Visit (HOSPITAL_BASED_OUTPATIENT_CLINIC_OR_DEPARTMENT_OTHER): Admitting: *Deleted

## 2024-07-20 DIAGNOSIS — I10 Essential (primary) hypertension: Secondary | ICD-10-CM | POA: Diagnosis not present

## 2024-07-20 NOTE — Progress Notes (Signed)
 Patient is in office today for a nurse visit for Blood Pressure Check. Patient blood pressure was 135/87, Patient No chest pain, No shortness of breath, No dyspnea on exertion, No orthopnea, No paroxysmal nocturnal dyspnea, No edema, No palpitations, No syncope

## 2024-07-21 ENCOUNTER — Ambulatory Visit (HOSPITAL_BASED_OUTPATIENT_CLINIC_OR_DEPARTMENT_OTHER): Payer: Self-pay | Admitting: Family Medicine

## 2024-07-21 LAB — BASIC METABOLIC PANEL WITH GFR
BUN/Creatinine Ratio: 7 — ABNORMAL LOW (ref 9–20)
BUN: 7 mg/dL (ref 6–20)
CO2: 21 mmol/L (ref 20–29)
Calcium: 9.5 mg/dL (ref 8.7–10.2)
Chloride: 103 mmol/L (ref 96–106)
Creatinine, Ser: 0.97 mg/dL (ref 0.76–1.27)
Glucose: 83 mg/dL (ref 70–99)
Potassium: 4.4 mmol/L (ref 3.5–5.2)
Sodium: 140 mmol/L (ref 134–144)
eGFR: 107 mL/min/1.73 (ref >59)

## 2024-07-21 NOTE — Progress Notes (Signed)
Renal function and electrolytes are stable.

## 2024-09-05 ENCOUNTER — Other Ambulatory Visit (HOSPITAL_BASED_OUTPATIENT_CLINIC_OR_DEPARTMENT_OTHER): Payer: Self-pay | Admitting: Family Medicine

## 2024-09-05 DIAGNOSIS — I10 Essential (primary) hypertension: Secondary | ICD-10-CM

## 2024-10-12 ENCOUNTER — Encounter (HOSPITAL_BASED_OUTPATIENT_CLINIC_OR_DEPARTMENT_OTHER): Payer: Self-pay | Admitting: Family Medicine

## 2024-10-12 ENCOUNTER — Ambulatory Visit (HOSPITAL_BASED_OUTPATIENT_CLINIC_OR_DEPARTMENT_OTHER): Admitting: Family Medicine

## 2024-10-12 ENCOUNTER — Other Ambulatory Visit (HOSPITAL_BASED_OUTPATIENT_CLINIC_OR_DEPARTMENT_OTHER): Payer: Self-pay

## 2024-10-12 VITALS — BP 136/94 | HR 96 | Ht 75.0 in | Wt 341.0 lb

## 2024-10-12 DIAGNOSIS — Z23 Encounter for immunization: Secondary | ICD-10-CM | POA: Diagnosis not present

## 2024-10-12 DIAGNOSIS — I10 Essential (primary) hypertension: Secondary | ICD-10-CM | POA: Diagnosis not present

## 2024-10-12 DIAGNOSIS — G4733 Obstructive sleep apnea (adult) (pediatric): Secondary | ICD-10-CM | POA: Diagnosis not present

## 2024-10-12 MED ORDER — ZEPBOUND 2.5 MG/0.5ML ~~LOC~~ SOAJ
2.5000 mg | SUBCUTANEOUS | 1 refills | Status: AC
  Filled 2024-10-12 – 2024-10-15 (×2): qty 2, 28d supply, fill #0

## 2024-10-12 NOTE — Patient Instructions (Signed)
 Please check blood pressure at home regularly. Restart Losartan  25mg . Goal BP is less than 130/80. Stay well hydrated.

## 2024-10-12 NOTE — Progress Notes (Signed)
 Subjective:   Noah Moore [birth year] 10/12/2024  Chief Complaint  Patient presents with   Medical Management of Chronic Issues    54-month follow up for OSA and weight; denies any main concerns for today's visit.    Discussed the use of AI scribe software for clinical note transcription with the patient, who gave verbal consent to proceed.  History of Present Illness Noah Moore is a 33 year old male with hypertension who presents for medication management and follow-up.  He stopped taking compounded Tirzepatide  approximately 3 months ago after taking it for about a month, during which he took four to five doses. He did not experience any side effects during that time. He did not increase the initial dosage and did not experience improvement.   HTN: He also stopped taking his blood pressure medication, Losartan , but wants to restart it due to elevated blood pressure medication.   OSA:  He has been approved for a CPAP machine for sleep apnea but has not yet received it. He does not currently use a CPAP machine at night.  No headaches, vision changes, or chest pains. His blood pressure was recorded as 136/94.    Wt Readings from Last 3 Encounters:  10/12/24 (!) 341 lb (154.7 kg)  06/08/24 (!) 340 lb (154.2 kg)  05/05/24 (!) 336 lb (152.4 kg)     The following portions of the patient's history were reviewed and updated as appropriate: past medical history, past surgical history, family history, social history, allergies, medications, and problem list.   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/17/2020   SIRS (systemic inflammatory response syndrome) (HCC) 03/17/2020   Paronychia of finger 03/17/2020   Thrombocytosis 03/17/2020   Sepsis (HCC) 03/17/2020   Past Medical History:  Diagnosis Date   Alcohol use 06/17/2017   Allergy    DVT (deep venous thrombosis) (HCC)    DVT (deep venous thrombosis) (HCC) 02/2020   Environmental allergies 08/04/2014   Epigastric pain 06/17/2017   GERD (gastroesophageal reflux disease)    Seasonal allergies 08/03/2015   Past Surgical History:  Procedure Laterality Date   CORONARY ULTRASOUND/IVUS N/A 03/30/2020   Procedure: INTRAVASCULAR ULTRASOUND/IVUS;  Surgeon: Gretta Lonni PARAS, MD;  [facility];  Service: Cardiovascular;  Laterality: N/A;   CORONARY ULTRASOUND/IVUS N/A 06/24/2020   Procedure: Intravascular Ultrasound/IVUS;  Surgeon: Gretta Lonni PARAS, MD;  [facility];  Service: Cardiovascular;  Laterality: N/A;   IVC VENOGRAPHY N/A 03/30/2020   Procedure: IVC Venography;  Surgeon: Gretta Lonni PARAS, MD;  [facility];  Service: Cardiovascular;  Laterality: N/A;   KNEE SURGERY Right    LEG SURGERY     Reports history of right lower extremity tibia fracture in 2010 which was repaired surgically.   LOWER EXTREMITY VENOGRAPHY Left 03/30/2020   Procedure: LOWER EXTREMITY VENOGRAPHY;  Surgeon: Gretta Lonni PARAS, MD;  [facility];  Service: Cardiovascular;  Laterality: Left;   LOWER  EXTREMITY VENOGRAPHY  06/23/2020   Procedure: LOWER EXTREMITY VENOGRAPHY;  Surgeon: Gretta Lonni PARAS, MD;  [facility];  Service: Cardiovascular;;   PERIPHERAL VASCULAR THROMBECTOMY Left 03/30/2020   Procedure: PERIPHERAL VASCULAR THROMBECTOMY;  Surgeon: Gretta Lonni PARAS, MD;  [facility];  Service: Cardiovascular;  Laterality: Left;   PERIPHERAL VASCULAR THROMBECTOMY N/A 06/24/2020   Procedure: PERIPHERAL VASCULAR THROMBECTOMY;  Surgeon: Gretta Lonni PARAS, MD;  [facility];  Service: Cardiovascular;  Laterality: N/A;   THROMBECTOMY  03/2020   ULTRASOUND GUIDANCE FOR VASCULAR ACCESS N/A 07/28/2020   Procedure: ULTRASOUND GUIDANCE ACCESS OF RIGHT INTERNAL JUGULAR, ULTRASOUND GUIDANCE ACCESS OF POSTERIOR TIBIAL VEIN;  Surgeon: Sheree Penne Lonni, MD;  Location[facility] OR;  Service: Vascular;  Laterality: N/A;   VENOGRAM Left 07/28/2020   Procedure: CENTRAL VENOGRAM AND LEFT LOWER EXTREMITY VENOGRAM;  Surgeon: Sheree Penne Lonni, MD;  Location: [facility] OR;  Service: Vascular;  Laterality: Left;   WISDOM TOOTH EXTRACTION     Family History  Adopted: Yes  Problem Relation Age of Onset   Alcoholism Mother    Diabetes Mother    High blood pressure Mother    Alcoholism Father    Cancer Maternal Grandmother    Venous thrombosis Neg Hx    Outpatient Medications Prior to Visit  Medication Sig Dispense Refill   fondaparinux (ARIXTRA) 10 MG/0.8ML SOLN injection Inject 10 mg into the skin.     losartan  (COZAAR ) 25 MG tablet TAKE 1 TABLET (25 MG TOTAL) BY MOUTH DAILY. 90 tablet 1   TIRZEPATIDE -WEIGHT MANAGEMENT [place] Inject into the skin. LipoSlim through [facility]: Inject 25 units into the skin once weekly. Titrate by 25 units every 4 weeks.     No facility-administered medications prior to visit.   No Known Allergies   ROS: A complete ROS was performed with pertinent positives/negatives noted in the HPI. The remainder of the ROS are negative.    Objective:   Today's Vitals   10/12/24 0949 10/12/24 1012  BP: (!) 133/91 (!) 136/94  Pulse: 96   SpO2: 98%   Weight: (!) 341 lb (154.7 kg)   Height: 6' 3 (1.905 m)     Physical Exam   GENERAL: Well-appearing, in NAD. Obese.  SKIN: Pink, warm and dry.  THROAT: Uvula midline.  Oropharynx clear. Mucous membranes pink and moist.  RESPIRATORY: Chest wall symmetrical. Respirations even and non-labored.  MSK: Muscle tone and strength appropriate for age.  EXTREMITIES: Without clubbing, cyanosis, or edema.  NEUROLOGIC: No motor or sensory deficits. Steady, even gait. C2-C12 intact.  PSYCH/MENTAL STATUS: Alert, oriented x 3. Cooperative, appropriate mood and affect.      Assessment & Plan:  1. Immunization due (Primary) - Flu vaccine trivalent PF, 6mos and older(Flulaval,Afluria,Fluarix,Fluzone)  2. Essential hypertension Uncontrolled. Patient will restart Losartan  25mg  daily and check BP regularly. Will return in 2 weeks for BP Check and BMP.  - Basic metabolic panel with GFR; Future  3. OSA (obstructive sleep apnea) Patient instructed to start CPAP therapy and contact [facility] for supplies.   4. Morbid obesity (HCC) Discussed comorbidities of OSA and HTN due to morbid obesity. Recommend starting Zepbound  as this is covered for OSA at $85/month for patient. Pt is agreeable. Discussed possible side effects and adverse effects and patient is agreeable.  Meds ordered this encounter  Medications   tirzepatide  (ZEPBOUND ) 2.5 MG/0.5ML Pen    Sig: Inject 2.5 mg into the skin once a week.  Dispense:  2 mL    Refill:  1    Supervising Provider:   DE CUBA, RAYMOND J [id number]   Lab Orders         Basic metabolic panel with GFR     No images are attached to the encounter or orders placed in the encounter.  Return for 2 weeks BP Check and Lab only Nurse visit: 4 months Office Visit .    Patient to reach out to office if new, worrisome, or unresolved symptoms arise or if no improvement in patient's condition. Patient verbalized understanding and is agreeable to treatment plan. All questions answered to patient's satisfaction.    Thersia Schuyler Stark, [place]

## 2024-10-14 ENCOUNTER — Other Ambulatory Visit (HOSPITAL_BASED_OUTPATIENT_CLINIC_OR_DEPARTMENT_OTHER): Payer: Self-pay

## 2024-10-15 ENCOUNTER — Other Ambulatory Visit (HOSPITAL_BASED_OUTPATIENT_CLINIC_OR_DEPARTMENT_OTHER): Payer: Self-pay

## 2024-10-19 ENCOUNTER — Other Ambulatory Visit (HOSPITAL_BASED_OUTPATIENT_CLINIC_OR_DEPARTMENT_OTHER): Payer: Self-pay

## 2024-10-19 ENCOUNTER — Encounter (HOSPITAL_BASED_OUTPATIENT_CLINIC_OR_DEPARTMENT_OTHER): Payer: Self-pay

## 2024-10-20 ENCOUNTER — Encounter (HOSPITAL_BASED_OUTPATIENT_CLINIC_OR_DEPARTMENT_OTHER): Payer: Self-pay | Admitting: Family Medicine

## 2024-10-20 ENCOUNTER — Telehealth (HOSPITAL_BASED_OUTPATIENT_CLINIC_OR_DEPARTMENT_OTHER): Payer: Self-pay | Admitting: Pharmacy Technician

## 2024-10-20 ENCOUNTER — Other Ambulatory Visit (HOSPITAL_COMMUNITY): Payer: Self-pay

## 2024-10-20 NOTE — Telephone Encounter (Signed)
 Please see mychart message sent by pt about Zepbound  and advise.

## 2024-10-20 NOTE — Telephone Encounter (Signed)
 Routing prior auth response to [place] for review to determine what she recommends for pt.

## 2024-10-20 NOTE — Telephone Encounter (Signed)
 Pharmacy Patient Advocate Encounter  Received notification from [facility] that Prior Authorization for Zepbound  2.5MG /0.5ML pen-injectors has been DENIED.  Full denial letter will be uploaded to the media tab. See denial reason below.   PA #/Case ID/Reference #: [id number]

## 2024-10-21 NOTE — Telephone Encounter (Signed)
 Routing new messages to [place] for her to review to see what she recommends.

## 2024-11-02 ENCOUNTER — Ambulatory Visit (HOSPITAL_BASED_OUTPATIENT_CLINIC_OR_DEPARTMENT_OTHER)

## 2024-11-03 ENCOUNTER — Other Ambulatory Visit (HOSPITAL_COMMUNITY): Payer: Self-pay

## 2024-11-03 ENCOUNTER — Other Ambulatory Visit (HOSPITAL_BASED_OUTPATIENT_CLINIC_OR_DEPARTMENT_OTHER): Payer: Self-pay

## 2024-11-03 ENCOUNTER — Encounter: Payer: Self-pay | Admitting: Adult Health

## 2024-11-04 NOTE — Telephone Encounter (Signed)
 FYI

## 2024-11-06 ENCOUNTER — Other Ambulatory Visit (HOSPITAL_BASED_OUTPATIENT_CLINIC_OR_DEPARTMENT_OTHER): Payer: Self-pay | Admitting: Family Medicine

## 2024-11-09 NOTE — Telephone Encounter (Signed)
 Please see new mychart sent by pt as an FYI.

## 2024-12-29 ENCOUNTER — Encounter (HOSPITAL_BASED_OUTPATIENT_CLINIC_OR_DEPARTMENT_OTHER): Payer: Self-pay | Admitting: Family Medicine

## 2024-12-29 DIAGNOSIS — I10 Essential (primary) hypertension: Secondary | ICD-10-CM

## 2024-12-29 MED ORDER — LOSARTAN POTASSIUM 25 MG PO TABS: 25.0000 mg | ORAL_TABLET | Freq: Every day | ORAL | 1 refills | Status: AC

## 2025-02-09 ENCOUNTER — Ambulatory Visit (HOSPITAL_BASED_OUTPATIENT_CLINIC_OR_DEPARTMENT_OTHER): Admitting: Family Medicine
